# Patient Record
Sex: Female | Born: 1950 | Race: White | Hispanic: No | Smoking: Former smoker
Health system: Southern US, Community
[De-identification: ages and names within clinical notes are randomized; demographics above are authoritative.]

## PROBLEM LIST (undated history)

## (undated) DIAGNOSIS — R55 Syncope and collapse: Secondary | ICD-10-CM

## (undated) DIAGNOSIS — E119 Type 2 diabetes mellitus without complications: Secondary | ICD-10-CM

## (undated) DIAGNOSIS — C50919 Malignant neoplasm of unspecified site of unspecified female breast: Secondary | ICD-10-CM

## (undated) DIAGNOSIS — I82409 Acute embolism and thrombosis of unspecified deep veins of unspecified lower extremity: Secondary | ICD-10-CM

## (undated) DIAGNOSIS — I252 Old myocardial infarction: Secondary | ICD-10-CM

## (undated) DIAGNOSIS — G4733 Obstructive sleep apnea (adult) (pediatric): Secondary | ICD-10-CM

## (undated) DIAGNOSIS — K219 Gastro-esophageal reflux disease without esophagitis: Secondary | ICD-10-CM

## (undated) DIAGNOSIS — I5042 Chronic combined systolic (congestive) and diastolic (congestive) heart failure: Secondary | ICD-10-CM

## (undated) DIAGNOSIS — G629 Polyneuropathy, unspecified: Secondary | ICD-10-CM

## (undated) DIAGNOSIS — I2510 Atherosclerotic heart disease of native coronary artery without angina pectoris: Secondary | ICD-10-CM

## (undated) DIAGNOSIS — I255 Ischemic cardiomyopathy: Secondary | ICD-10-CM

## (undated) DIAGNOSIS — E785 Hyperlipidemia, unspecified: Secondary | ICD-10-CM

## (undated) DIAGNOSIS — I739 Peripheral vascular disease, unspecified: Secondary | ICD-10-CM

## (undated) DIAGNOSIS — I10 Essential (primary) hypertension: Secondary | ICD-10-CM

## (undated) DIAGNOSIS — Z8673 Personal history of transient ischemic attack (TIA), and cerebral infarction without residual deficits: Secondary | ICD-10-CM

## (undated) HISTORY — PX: TUBAL LIGATION: SHX77

## (undated) HISTORY — DX: Chronic combined systolic (congestive) and diastolic (congestive) heart failure: I50.42

## (undated) HISTORY — DX: Hyperlipidemia, unspecified: E78.5

## (undated) HISTORY — PX: BREAST BIOPSY: SHX20

## (undated) HISTORY — DX: Atherosclerotic heart disease of native coronary artery without angina pectoris: I25.10

## (undated) HISTORY — DX: Polyneuropathy, unspecified: G62.9

## (undated) HISTORY — PX: APPENDECTOMY: SHX54

## (undated) HISTORY — DX: Peripheral vascular disease, unspecified: I73.9

## (undated) HISTORY — PX: CHOLECYSTECTOMY: SHX55

## (undated) HISTORY — DX: Syncope and collapse: R55

## (undated) HISTORY — DX: Essential (primary) hypertension: I10

## (undated) HISTORY — DX: Acute embolism and thrombosis of unspecified deep veins of unspecified lower extremity: I82.409

## (undated) HISTORY — DX: Type 2 diabetes mellitus without complications: E11.9

## (undated) HISTORY — DX: Morbid (severe) obesity due to excess calories: E66.01

## (undated) HISTORY — PX: PR VEIN BYPASS GRAFT,AORTO-FEM-POP: 35551

## (undated) HISTORY — DX: Obstructive sleep apnea (adult) (pediatric): G47.33

## (undated) HISTORY — DX: Ischemic cardiomyopathy: I25.5

## (undated) HISTORY — PX: BREAST SURGERY: SHX581

## (undated) HISTORY — DX: Gastro-esophageal reflux disease without esophagitis: K21.9

## (undated) HISTORY — DX: Malignant neoplasm of unspecified site of unspecified female breast: C50.919

## (undated) HISTORY — DX: Personal history of transient ischemic attack (TIA), and cerebral infarction without residual deficits: Z86.73

---

## 2000-11-11 DIAGNOSIS — C50919 Malignant neoplasm of unspecified site of unspecified female breast: Secondary | ICD-10-CM

## 2000-11-11 HISTORY — DX: Malignant neoplasm of unspecified site of unspecified female breast: C50.919

## 2001-03-27 ENCOUNTER — Ambulatory Visit: Admission: RE | Admit: 2001-03-27 | Discharge: 2001-06-25 | Payer: Self-pay | Admitting: *Deleted

## 2005-08-21 ENCOUNTER — Ambulatory Visit: Payer: Self-pay | Admitting: Cardiology

## 2005-08-21 ENCOUNTER — Inpatient Hospital Stay (HOSPITAL_COMMUNITY): Admission: AD | Admit: 2005-08-21 | Discharge: 2005-08-23 | Payer: Self-pay | Admitting: Cardiology

## 2005-08-22 ENCOUNTER — Ambulatory Visit: Payer: Self-pay | Admitting: Cardiology

## 2007-03-18 ENCOUNTER — Ambulatory Visit (HOSPITAL_COMMUNITY): Admission: RE | Admit: 2007-03-18 | Discharge: 2007-03-18 | Payer: Self-pay | Admitting: Family Medicine

## 2008-05-13 ENCOUNTER — Emergency Department (HOSPITAL_COMMUNITY): Admission: EM | Admit: 2008-05-13 | Discharge: 2008-05-13 | Payer: Self-pay | Admitting: Emergency Medicine

## 2009-01-01 ENCOUNTER — Emergency Department (HOSPITAL_COMMUNITY): Admission: EM | Admit: 2009-01-01 | Discharge: 2009-01-01 | Payer: Self-pay | Admitting: Emergency Medicine

## 2009-01-04 ENCOUNTER — Ambulatory Visit: Payer: Self-pay | Admitting: Vascular Surgery

## 2009-01-05 ENCOUNTER — Encounter: Admission: RE | Admit: 2009-01-05 | Discharge: 2009-01-05 | Payer: Self-pay | Admitting: Vascular Surgery

## 2009-01-06 ENCOUNTER — Ambulatory Visit: Payer: Self-pay | Admitting: Cardiology

## 2009-01-06 ENCOUNTER — Encounter (HOSPITAL_COMMUNITY): Admission: RE | Admit: 2009-01-06 | Discharge: 2009-02-05 | Payer: Self-pay

## 2009-01-10 ENCOUNTER — Ambulatory Visit (HOSPITAL_COMMUNITY): Admission: RE | Admit: 2009-01-10 | Discharge: 2009-01-10 | Payer: Self-pay | Admitting: Surgery

## 2009-01-10 ENCOUNTER — Ambulatory Visit: Payer: Self-pay | Admitting: Surgery

## 2009-02-09 ENCOUNTER — Ambulatory Visit: Payer: Self-pay | Admitting: Vascular Surgery

## 2009-03-29 ENCOUNTER — Ambulatory Visit: Payer: Self-pay | Admitting: Vascular Surgery

## 2009-06-05 ENCOUNTER — Encounter: Admission: RE | Admit: 2009-06-05 | Discharge: 2009-06-05 | Payer: Self-pay | Admitting: Internal Medicine

## 2009-06-07 ENCOUNTER — Ambulatory Visit: Payer: Self-pay | Admitting: Vascular Surgery

## 2009-09-27 ENCOUNTER — Ambulatory Visit: Payer: Self-pay | Admitting: Vascular Surgery

## 2009-10-09 ENCOUNTER — Ambulatory Visit: Payer: Self-pay | Admitting: Surgery

## 2010-01-03 ENCOUNTER — Ambulatory Visit: Payer: Self-pay | Admitting: Vascular Surgery

## 2010-01-12 ENCOUNTER — Ambulatory Visit (HOSPITAL_COMMUNITY): Admission: RE | Admit: 2010-01-12 | Discharge: 2010-01-12 | Payer: Self-pay | Admitting: Vascular Surgery

## 2010-01-12 ENCOUNTER — Ambulatory Visit: Payer: Self-pay | Admitting: Vascular Surgery

## 2010-02-14 ENCOUNTER — Ambulatory Visit: Payer: Self-pay | Admitting: Vascular Surgery

## 2010-09-07 ENCOUNTER — Ambulatory Visit: Payer: Self-pay | Admitting: Vascular Surgery

## 2010-10-25 ENCOUNTER — Ambulatory Visit: Payer: Self-pay | Admitting: Vascular Surgery

## 2010-11-30 ENCOUNTER — Encounter: Payer: Self-pay | Admitting: Vascular Surgery

## 2010-11-30 ENCOUNTER — Inpatient Hospital Stay (HOSPITAL_COMMUNITY)
Admission: RE | Admit: 2010-11-30 | Discharge: 2010-12-02 | Payer: Self-pay | Source: Home / Self Care | Attending: Vascular Surgery | Admitting: Vascular Surgery

## 2010-11-30 HISTORY — PX: POPLITEAL ARTERY ANGIOPLASTY: SHX2242

## 2010-12-02 ENCOUNTER — Encounter: Payer: Self-pay | Admitting: Family Medicine

## 2010-12-03 LAB — POCT I-STAT, CHEM 8
BUN: 14 mg/dL (ref 6–23)
Calcium, Ion: 1.13 mmol/L (ref 1.12–1.32)
Chloride: 105 mEq/L (ref 96–112)
Creatinine, Ser: 0.7 mg/dL (ref 0.4–1.2)
HCT: 46 % (ref 36.0–46.0)
Hemoglobin: 15.6 g/dL — ABNORMAL HIGH (ref 12.0–15.0)
TCO2: 29 mmol/L (ref 0–100)

## 2010-12-03 LAB — PROTIME-INR
INR: 1.01 (ref 0.00–1.49)
INR: 0.95 (ref 0.00–1.49)
Prothrombin Time: 13.5 seconds (ref 11.6–15.2)
Prothrombin Time: 12.9 seconds (ref 11.6–15.2)

## 2010-12-03 LAB — GLUCOSE, CAPILLARY
Glucose-Capillary: 249 mg/dL — ABNORMAL HIGH (ref 70–99)
Glucose-Capillary: 298 mg/dL — ABNORMAL HIGH (ref 70–99)

## 2010-12-03 LAB — APTT
aPTT: 194 seconds — ABNORMAL HIGH (ref 24–37)
aPTT: 53 seconds — ABNORMAL HIGH (ref 24–37)

## 2010-12-04 LAB — BASIC METABOLIC PANEL
BUN: 10 mg/dL (ref 6–23)
CO2: 24 mEq/L (ref 19–32)
Calcium: 8 mg/dL — ABNORMAL LOW (ref 8.4–10.5)
Chloride: 106 mEq/L (ref 96–112)
Creatinine, Ser: 0.63 mg/dL (ref 0.4–1.2)
GFR calc Af Amer: >60 mL/min (ref >60)
GFR calc non Af Amer: >60 mL/min (ref >60)
Glucose, Bld: 207 mg/dL — ABNORMAL HIGH (ref 70–99)
Potassium: 3.5 mEq/L (ref 3.5–5.1)
Sodium: 139 mEq/L (ref 135–145)

## 2010-12-04 LAB — CBC
HCT: 38.7 % (ref 36.0–46.0)
Hemoglobin: 12.5 g/dL (ref 12.0–15.0)
MCH: 28.9 pg (ref 26.0–34.0)
Platelets: 178 10*3/uL (ref 150–400)
RBC: 4.32 MIL/uL (ref 3.87–5.11)
RDW: 12.4 % (ref 11.5–15.5)
WBC: 10 10*3/uL (ref 4.0–10.5)

## 2010-12-04 LAB — GLUCOSE, CAPILLARY
Glucose-Capillary: 168 mg/dL — ABNORMAL HIGH (ref 70–99)
Glucose-Capillary: 212 mg/dL — ABNORMAL HIGH (ref 70–99)
Glucose-Capillary: 132 mg/dL — ABNORMAL HIGH (ref 70–99)

## 2010-12-04 LAB — HEMOGLOBIN A1C: Hgb A1c MFr Bld: 12.2 % — ABNORMAL HIGH (ref <5.7)

## 2010-12-13 ENCOUNTER — Encounter (INDEPENDENT_AMBULATORY_CARE_PROVIDER_SITE_OTHER): Payer: Medicare Other

## 2010-12-13 ENCOUNTER — Ambulatory Visit (INDEPENDENT_AMBULATORY_CARE_PROVIDER_SITE_OTHER): Payer: Medicare Other | Admitting: Vascular Surgery

## 2010-12-13 DIAGNOSIS — I70219 Atherosclerosis of native arteries of extremities with intermittent claudication, unspecified extremity: Secondary | ICD-10-CM

## 2010-12-13 DIAGNOSIS — I739 Peripheral vascular disease, unspecified: Secondary | ICD-10-CM

## 2010-12-13 DIAGNOSIS — Z48812 Encounter for surgical aftercare following surgery on the circulatory system: Secondary | ICD-10-CM

## 2010-12-14 NOTE — Op Note (Signed)
NAMESIMRANJIT, [name]                 ACCOUNT NO.:  [id number]  MEDICAL RECORD NO.:  [id number]          PATIENT TYPE:  OIB  LOCATION:  3303                         FACILITY:  [facility]  PHYSICIAN:  [name]. Margene Cherian, MD  DATE OF BIRTH:  1951-10-13  DATE OF PROCEDURE:  11/30/2010 DATE OF DISCHARGE:                              OPERATIVE REPORT   PROCEDURE: 1. Left popliteal and tibial embolectomy. 2. Left popliteal endarterectomy. 3. Bovine pericardial patch angioplasty of left popliteal artery.  PREOPERATIVE DIAGNOSIS:  Acute ischemia, left leg.  POSTOPERATIVE DIAGNOSIS:  Acute ischemia, left leg.  ANESTHESIA:  General.  ASSISTANTS: 1. Di Kindle. Edilia Bo, MD 2. Della Goo, PA-C  OPERATIVE FINDINGS: 1. Acute thrombus in distal popliteal artery and origin of     tibioperoneal trunk and anterior tibial artery. 2. Bovine pericardial patch.  OPERATIVE DETAILS:  After obtaining informed consent, the patient was taken to the operating room.  The patient was placed in supine position on the operating room table.  After induction of general anesthesia and endotracheal intubation, the patient's entire left lower extremity was prepped and draped in the usual sterile fashion.  Next, a longitudinal incision was made on the medial aspect of the left leg.  The incision was carried down through subcutaneous tissues.  Greater saphenous vein was identified and reflected superiorly.  The incision was deepened down to level of the fascia.  The fascia was opened for the full length of the incision.  The popliteal space was entered.  The popliteal vein was reflected posteriorly.  The anterior tibial branch of the popliteal vein was dissected free circumferentially and ligated and divided between silk ties.  The popliteal artery was dissected free circumferentially. It was quite thickened on palpation.  The anterior tibial artery and tibioperoneal trunk were both dissected free  circumferentially and vessel loops were placed around these.  Vessel loops were also placed around the popliteal artery.  The patient was given 7000 units of intravenous heparin.  After appropriate circulation time, a longitudinal opening was made in the popliteal artery just above the level of the takeoff of the tibial vessels.  There was fresh thrombus in the form of two 1-mm size pieces right at the takeoff of tibial vessels in the distal popliteal artery.  The distal popliteal artery was severely atherosclerotic and there was a large plaque right at the level of the takeoff of the tibial vessels which was heavily calcified.  Next, a #3 and #4 Fogarty catheter was passed up to the proximal popliteal artery. Multiple passes were made until there was excellent arterial inflow. There was no obvious thrombus.  This was then reoccluded with a vessel loop.  A #3 Fogarty catheter was then passed down the anterior tibial artery.  Multiple passes were made so all thrombotic material was removed and there was good backbleeding and there were 2 clean passes obtained.  A #3 Fogarty catheter was then passed on the tibioperoneal trunk.  One pass was made which was clean with no thrombus return.  I attempted to make an additional pass, but the catheter would not pass easily.  Therefore,  rather than risk an injury in the artery, no further passes were made down the tibioperoneal trunk.  Everything was thoroughly irrigated with heparinized saline solution.  The atherosclerotic plaque in the popliteal artery had peeled away from the wall and there was no other choice except to do a popliteal endarterectomy.  A good proximal endpoint was obtained with a slight step-off.  The distal endpoint was tacked with a single 7-0 Prolene suture on the posterior wall.  A bovine pericardial patch was then brought up in the operative field and sewn as a patch angioplasty.  The patch angioplasty extended from  approximately 2 cm in the tibioperoneal trunk all the way up on the popliteal artery.  The entire patch length was approximately 4 cm.  Just prior to completion of the patch, everything was fore bled, back bled, and thoroughly flushed.  The anastomosis was secured, clamps were released, and there was pulsatile flow of popliteal artery immediately.  There was good Doppler flow in the proximal anterior tibial and tibioperoneal trunk.  The patient also had a dorsalis pedis and posterior tibial Doppler signal at the foot. Hemostasis was obtained.  Fascia was reapproximated using running 3-0 Vicryl suture.  The skin was closed with 4-0 Vicryl subcuticular stitch. The patient tolerated the procedure well.  There were no complications. Instrument count, sponge count, and needle counts were correct at the end of the case.  The patient was taken to recovery room in stable condition.     [name]. Seaton Hofmann, MD     CEF/MEDQ  D:  11/30/2010  T:  12/01/2010  Job:  [id number]  Electronically Signed by Fabienne Bruns MD on 12/14/2010 01:08:18 PM

## 2010-12-14 NOTE — Op Note (Signed)
[name]                 ACCOUNT NO.:  [id number]  MEDICAL RECORD NO.:  [id number]          PATIENT TYPE:  OIB  LOCATION:  3303                         FACILITY:  [facility]  PHYSICIAN:  [name]. Joannie Medine, MD  DATE OF BIRTH:  [birth year]  DATE OF PROCEDURE:  11/30/2010 DATE OF DISCHARGE:                              OPERATIVE REPORT   PROCEDURES: 1. Ultrasound of right groin. 2. Aortogram with bilateral lower extremity runoff. 3. Angioplasty and stenting of left superficial femoral artery.  PREOPERATIVE DIAGNOSIS:  Claudication.  POSTOPERATIVE DIAGNOSIS:  Claudication.  ANESTHESIA:  Local with IV sedation.  OPERATIVE DETAILS:  After obtaining informed consent, the patient was taken to the [facility] lab.  The patient was placed in supine position on the angio table.  Both groins were prepped and draped in the usual sterile fashion.  Ultrasound was used to identify the right common femoral artery.  Using ultrasound guidance, an introducer needle was used to cannulate the right common femoral artery after administration of local anesthesia.  A 0.035 Versacore wire was threaded into the right femoral artery and up into the abdominal aorta under fluoroscopic guidance. Next, a 5-French sheath was placed in the right common femoral artery and this was thoroughly flushed with heparinized saline.  A 5-French pigtail catheter was then placed over the guidewire up into the abdominal aorta and abdominal aortogram obtained.  This shows bilateral single renal arteries which are patent.  The infrarenal abdominal aorta is also patent.  The left and right common, external, and internal iliac arteries are patent.  An oblique view of the pelvis was also performed, and this showed that the needle puncture was actually at the femoral bifurcation or the very proximal superficial femoral artery.  A lower extremity runoff view was then obtained after pulling the pigtail catheter down just above the  aortic bifurcation.  In the left lower extremity, the left common femoral profunda femoris artery is patent. The left superficial femoral artery has a high-grade greater than 90% stenosis above the level of a patent left superficial femoral artery stent.  There is some mild in-stent restenosis in the left superficial femoral artery stent.  Below that, the popliteal artery is patent.  The tibioperoneal trunk and anterior tibial arteries are patent, although there is some atherosclerotic disease and narrowing at the takeoff of these tibial vessels.  There is three-vessel runoff to the left foot.  In the right lower extremity, the right common femoral artery is patent. The right profunda femoris artery is patent.  The right superficial femoral artery is occluded approximately 2 cm after its origin.  It then reconstitutes via profunda collaterals.  The popliteal artery is patent above the knee.  There is three-vessel runoff to the right foot. However, the anterior tibial artery is cut off on the lateral aspect of the page.  At this point, it was decided to intervene on the left SFA stenosis.  The patient was given 5000 units of intravenous heparin.  It was decided to intervene on the left lower extremity.  At this point, the pigtail catheter was removed over a guidewire.  A  5-French crossover catheter was then brought up in the operative field, and this was used to selectively catheterize the left common iliac artery.  The 0.035 Versacore wire was then threaded down into the left external iliac artery and into the proximal left superficial femoral artery.  The crossover catheter was then removed, and the 5-French sheath exchanged for 7-French Terumo sheath.  Just prior to the sheath exchange, the patient was given 5000 units of intravenous heparin.  The Versacore wire was held in place while the sheath was exchanged.  The Versacore wire was then advanced down the superficial femoral artery,  and this easily crossed the stenosis in the superficial femoral artery and the wire was advanced to the distal end of the preexisting stent.  At this point, a 5 x 8 angioplasty balloon was advanced over the wire and several arteriograms were performed to determine the precise level of the stenosis.  The balloon was positioned at the central portion of the stenosis and then this was inflated to 10 atmospheres for 30 seconds. There was a small area of stenosis that was not covered with the initial inflation, so the balloon was pulled back several centimeters and was reinflated to 10 atmospheres from 40 seconds.  A contrast angiogram was performed which showed that the lumen was open enough to accept the stent at this point.  There was a small area of dissection in the proximal superficial femoral artery.  At this point, a 6 x 150 Smart self-expanding nitinol stent was brought up in the operative field and this was positioned over the area of the previous angioplasty with approximately 1-cm overlap of the distal stent.  Stent was then deployed.  The stent was then postdilated with a 6 x 120 angioplasty balloon to 8 atmospheres for 60 seconds, two inflations were performed to make sure that the entire distal preexisting stent was angioplastied due to some in-stent restenosis as well as the new stent.  At this point, the balloon was removed and a completion of the arteriogram was performed.  This showed that the stent was widely patent and the area of dissection had been completely covered with the stent proximally.  Both stents were widely patent distally; however, it was noted that there was diminished flow through the below-knee popliteal artery at this point. There was delayed filling of the distal anterior tibial artery, the peroneal artery, and the posterior tibial artery, most likely due to occlusion at the level of the takeoff of these vessels.  At this point, the patient was given an  additional 5000 units of heparin and a 10 mg dose of tenecteplase was ordered from the pharmacy.  Since it took approximately 20 minutes for the tenecteplase to back from the pharmacy and an additional 5000 units of heparin was also given to the patient. The tenecteplase was injected through the Terumo sheath directly into the left superficial femoral artery.  An additional image was obtained after approximately 2 minutes of circulation time.  This again showed occlusion at the level of the popliteal artery at the takeoff of the tibial vessels.  A total of 15 minutes was then used before an additional angiogram was obtained, and this again showed distal occlusion of the popliteal artery.  At this point, it was decided that no further intervention would be performed and the patient would need a surgical embolectomy.  The 7-French Terumo sheath was pulled back over the guidewire, and the guidewire was pulled into the iliac system.  The 7-French  Terumo sheath was then exchanged for a 7-French short sheath and the guidewire removed.  The 7-French short sheath was then thoroughly flushed with heparinized saline.  The patient's left foot was inspected and found to be pink and warm with brisk capillary refill.  At this point, it was discussed with the patient that she would need a surgical embolectomy to remove the debris at her below-knee popliteal artery.  The 7-French sheath was sutured into the right groin.  The patient tolerated the procedure well; otherwise, there were no other complications.  The patient was taken to the operating room holding area for embolectomy.  COMPLICATIONS:  Embolus, left popliteal artery.  OPERATIVE FINDINGS: 1. Right superficial femoral artery occlusion 2 cm from the origin     with reconstitution of three-vessel runoff to the right foot     although the runoff was not completely visualized due to the     patient's size and the fact that she had an embolus in  the left     leg.  The right lower extremity views were not repeated further. 2. 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 3. In-stent restenosis of left superficial femoral artery stent,     angioplastied to zero residual stenosis. 4. Embolus to left below-knee popliteal artery at the level of the     tibial takeoff vessels.     [name]. Paxon Propes, MD     CEF/MEDQ  D:  11/30/2010  T:  12/01/2010  Job:  [id number]  Electronically Signed by Fabienne Bruns MD on 12/14/2010 01:08:10 PM

## 2010-12-18 NOTE — Discharge Summary (Addendum)
  [name], BREAU                 ACCOUNT NO.:  [id number]  MEDICAL RECORD NO.:  [id number]          PATIENT TYPE:  INP  LOCATION:  2006                         FACILITY:  [facility]  PHYSICIAN:  [name]. Makenzie Vittorio, MD  DATE OF BIRTH:  1951/08/29  DATE OF ADMISSION:  11/30/2010 DATE OF DISCHARGE:  12/02/2010                              DISCHARGE SUMMARY   PAST MEDICAL HISTORY:  Significant for insulin-dependent diabetes.  HISTORY OF PRESENT ILLNESS:  Ms. [name] was admitted with left fem-pop occlusive disease.  She had an angiogram with angioplasty and stenting of the left superficial femoral artery on November 30, 2010.  This was done for claudication.  She was also found to have embolus in the left popliteal artery after the procedure and was then taken emergently to the operating room for left popliteal tibial embolectomy, left popliteal endarterectomy, and Bovine patch angioplasty.  Postoperatively, the patient did well.  Foot was warm and pink.  She had good PT and DP per Doppler.  She was begun on ambulation.  She had no hematoma in the right groin and she was discharged on December 02, 2010.  FINAL DIAGNOSES: 1. Claudication with left fem-pop occlusive disease status post left     superficial femoral artery stenting. 2. Left popliteal and tibial embolus status post embolectomy, stenting     of her SFA. 3. Her diabetes was followed with sliding scale and stable while in-     house.  DISCHARGE MEDICATIONS:  Included Humulin NPH insulin 25 units twice daily, regular insulin 15 units 4 times a day.  She was sent home on oxycodone 1-2 tabs every 4 hours as needed for pain 5 mg and Plavix as well.     Della Goo, PA-C   ______________________________ [name] Kallyn Demarcus, MD    RR/MEDQ  D:  12/17/2010  T:  12/18/2010  Job:  [id number]  Electronically Signed by Della Goo PA on 12/18/2010 05:36:59 PM Electronically Signed by Fabienne Bruns MD on 01/02/2011 03:05:14  PM

## 2010-12-20 ENCOUNTER — Ambulatory Visit: Payer: Self-pay | Admitting: Vascular Surgery

## 2010-12-20 ENCOUNTER — Encounter (INDEPENDENT_AMBULATORY_CARE_PROVIDER_SITE_OTHER): Payer: Medicare Other

## 2010-12-20 DIAGNOSIS — I70219 Atherosclerosis of native arteries of extremities with intermittent claudication, unspecified extremity: Secondary | ICD-10-CM

## 2010-12-20 DIAGNOSIS — M79609 Pain in unspecified limb: Secondary | ICD-10-CM

## 2010-12-21 NOTE — Assessment & Plan Note (Signed)
OFFICE VISIT  [name], [name] DOB:  24-Jun-1951                                       12/20/2010 [id number]#:[id number]  This patient returns for follow-up today.  She was last seen on 12/13/2010. She recently underwent redo angioplasty of her left superficial femoral artery and had distal embolization requiring a thrombectomy and endarterectomy of the popliteal artery.  She was having some claudication symptoms on February 2.  At that time her ABI on the left side was 0.57, ABI on the right side was 0.47.  She returns today complaining of some pain around her incision with some continuing drainage from the incision.  She denies any fever or chills.  She had a venous duplex exam today which showed no evidence of DVT.  She did have some fluid in the popliteal space.  PHYSICAL EXAMINATION:  Blood pressure 177/97 in the right arm, heart rate 111 and regular, temperature is 98.4, respirations 24.  Left lower extremity incision has some mild erythema just around the skin edges. There is some serous sticky type drainage coming from the lower third of the incision.  This is fairly minimal but steady flow.  Left foot is pink and warm.  I believe the patient is a fairly low risk for infection currently and this most likely represents lymphatic leaks.  I believe the best option would be to place her on Keflex, a  2-week course in the event that there is some bacteria present.  She will continue to walk daily.  She will follow up in 2 weeks' time to recheck her wound.  Also was given a renewal prescription for hydrocodone 30 dispensed today.    Sherri Hora. Fields, MD Electronically Signed  CEF/MEDQ  D:  12/21/2010  T:  12/21/2010  Job:  [id number]

## 2010-12-24 NOTE — Assessment & Plan Note (Signed)
OFFICE VISIT  [name], [name] DOB:  12/11/1950                                       12/13/2010 HYQMV#:[id number]  The patient returns for follow-up today.  She recently underwent re- angioplasty of her left superficial femoral artery stent.  This was complicated by a distal embolization of plaque which required an endarterectomy of her left popliteal artery as well as thrombectomy of her popliteal artery.  She returns today for further follow-up.  She states that she still has claudication symptoms in her left leg as well as her right.  This is at fairly short distance.  She denies any rest pain in her left foot.  She is still requiring some narcotic pain medication for her recent operation in her left leg.  She states that she has not been smoking since her recent hospital admission.  She denies any significant drainage from her left leg incision although there was some drainage during the office visit today.  She is cleaning this daily with soap and water.  PHYSICAL EXAMINATION:  Blood pressure is 182/92 in the right arm, heart rate is 108 and regular.  Temperature is 98.6.  The left below-knee incision is healing.  There was a small amount of serous drainage. There is some edema in the left lower extremity.  The left foot is slightly cool but has fairly brisk capillary refill and is fairly symmetric in temperature to the right foot.  There are no open ulcers on the foot.  She had bilateral ABIs performed today.  The left side ABI was 0.57, which is essentially unchanged from before her angioplasty.  Right side ABI was 0.47, which is also unchanged.  In summary, the patient has a healing incision in her left lower extremity but still has significant claudication symptoms.  I do not believe she would be a candidate really for any further interventions from an endovascular standpoint such as angioplasty or stenting, especially in light of the fact of  the complication she had from her most recent angioplasty procedure.  I believe the best option at this point would be to allow her incision to heal completely and then based on her symptoms, we will have discussions on whether or not she would be a candidate for a bypass operation.  However, I think she would be high- risk for complications from this and that if her symptoms are claudication alone, we are probably going to observe her for awhile before embarking on another procedure.  She will return in 1 month's time to recheck her incision and have further discussions regarding where we go from here.    [name]. Fields, MD Electronically Signed  CEF/MEDQ  D:  12/13/2010  T:  12/14/2010  Job:  [id number]  cc:   Dr. Eric Form

## 2011-01-04 ENCOUNTER — Ambulatory Visit (INDEPENDENT_AMBULATORY_CARE_PROVIDER_SITE_OTHER): Payer: Medicare Other

## 2011-01-04 DIAGNOSIS — I70219 Atherosclerosis of native arteries of extremities with intermittent claudication, unspecified extremity: Secondary | ICD-10-CM

## 2011-01-04 NOTE — Assessment & Plan Note (Signed)
OFFICE VISIT  [name], [name] DOB:  [birth year]                                       01/04/2011 [id number]#:[id number]  This is a postop followup.  HISTORY OF PRESENT ILLNESS:  This is a 60 year old female that Dr. Darrick Penna previously has done a left popliteal and tibial embolectomy and bovine patch angioplasty of the left popliteal artery.  Apparently yesterday she noted some drainage from her left calf incision.  She notes some intermittent temperature fluctuation but she has a known history now of hot flashes so she is unclear whether or not she is truly having fevers or chills.  She thinks the drainage looks like pus.  She has previously been seen by Dr. Darrick Penna recently and placed on antibiotics.  PHYSICAL EXAMINATION:  Today she had a temperature of 98.1 with a blood pressure 163/90 with a heart rate of 108, respirations were 12.  On focused examination the left calf incision demonstrates area of separation.  There is a hematoma that is evident, with some serous drainage.  No frank purulence.  There is some erythema that is nonblanching around the incision but there is no streaking lymphangiitis or there is no spreading erythema up the leg.  There is some mild tenderness to palpation around the incision.  With gentle palpation I do not express any frank purulence of this incision.  MEDICAL DECISION MAKING:  This is a 60 year old female with multiple comorbidities including diabetes who presents now with a left wound superficial dehiscence.  The patient at this point is spontaneously draining and I suspect that the hematoma will spontaneously liquefy and she will drain further.  There is no spreading cellulitis in this patient's leg.  She is already on Keflex.  I have changed her antibiotics over to Bactrim double strength one p.o. b.i.d. to better cover for any possible MRSA.  I gave her the option of possibly coming to the hospital for exploration and  drainage of this calf.  She did not seem very interested in this option so at this point I recommended we continue with antibiotics and wet-to-dry dressing changes to this left calf.  She has a followup appointment with Dr. Darrick Penna this coming Thursday.  I also explained to her if she develops any spreading erythema or fever or chills that she needs to follow up with [place] in the ER, and at that point we would make further interventions as necessary. She understood these instructions and then nursing staff demonstrated to her how to do wet-to-dry dressing changes.  She is going to do this twice a day.    Fransisco Hertz, MD Electronically Signed  BLC/MEDQ  D:  01/04/2011  T:  01/04/2011  Job:  [id number]

## 2011-01-04 NOTE — Procedures (Unsigned)
DUPLEX DEEP VENOUS EXAM - LOWER EXTREMITY  INDICATION:  Pain and swelling at incision site from recent surgery.  HISTORY:  Edema:  Yes. Trauma/Surgery:  Recent arterial embolectomy. Pain:  Yes. PE:  No. Previous DVT:  No. Anticoagulants: Other:  DUPLEX EXAM:               CFV   SFV   PopV  PTV    GSV               R  L  R  L  R  L  R   L  R  L Thrombosis    o  o     o     o      o     o Spontaneous   +  +     +     +      +     + Phasic        +  +     +     +      +     + Augmentation  +  +     +     +      +     + Compressible  +  +     +     +      +     + Competent     +  +     +     +      +     +  Legend:  + - yes  o - no  p - partial  D - decreased  IMPRESSION:  No evidence of deep venous thrombosis or superficial venous thrombophlebitis in the left lower extremity; however, there is significant fluid in the tissues of the surgical site.  Right common femoral vein is within normal limits.   _____________________________ [name] Fields, MD  LT/MEDQ  D:  12/20/2010  T:  12/20/2010  Job:  [id number]

## 2011-01-07 ENCOUNTER — Ambulatory Visit: Payer: Medicare Other | Admitting: Surgery

## 2011-01-10 ENCOUNTER — Ambulatory Visit (INDEPENDENT_AMBULATORY_CARE_PROVIDER_SITE_OTHER): Payer: Medicare Other | Admitting: Vascular Surgery

## 2011-01-10 DIAGNOSIS — I70219 Atherosclerosis of native arteries of extremities with intermittent claudication, unspecified extremity: Secondary | ICD-10-CM

## 2011-01-11 NOTE — Assessment & Plan Note (Signed)
OFFICE VISIT  DVORA, BUITRON DOB:  01/20/1951                                       01/10/2011 ZOXWR#:[id number]  The patient is status post patch angioplasty of her left popliteal artery after distal embolization from a redo angioplasty of her SFA stent.  She returns today for further followup.  She was last seen by my partner, Dr. Imogene Burn, on February 24 for increased drainage from her below knee incision.  On exam today blood pressure is 144/80, heart rate is 112, temperature is 98.2.  The below knee incision has serous drainage coming from this. This was opened wider today to allow better drainage and packing.  The muscle tissue beneath is viable and pink in appearance, although there is minimal granulation tissue at this point.  Hopefully this will continue to heal with local wound care.  She is now developing an ulceration on the lateral aspect of her left foot.  Apparently this was an area of dry cracked skin which has now opened and turned into a small ulcer.  I counseled the family today in continuing to apply moisturizing lotion to her left foot to try to prevent further skin breakdown.  She is not a candidate for further revascularization currently due to the fact that she has an ongoing wound problem in her left leg.  She is at risk for limb loss long-term if this wound continues to deteriorate. She will follow up with [place] in 2 weeks' time and they will continue to be do b.i.d. normal saline wet-to-dry dressings.    [name]. Emari Hreha, MD Electronically Signed  CEF/MEDQ  D:  01/10/2011  T:  01/11/2011  Job:  [id number]

## 2011-01-15 ENCOUNTER — Ambulatory Visit (INDEPENDENT_AMBULATORY_CARE_PROVIDER_SITE_OTHER): Payer: Medicare Other | Admitting: Vascular Surgery

## 2011-01-15 DIAGNOSIS — I70219 Atherosclerosis of native arteries of extremities with intermittent claudication, unspecified extremity: Secondary | ICD-10-CM

## 2011-01-16 NOTE — Assessment & Plan Note (Signed)
OFFICE VISIT  [name], [name] DOB:  04/06/1951                                       01/15/2011 [id number]#:[id number]  This patient returns to the office today for inspection of her left popliteal wound.  She was last seen by Dr. Darrick Penna last week and was seen by Dr. Imogene Burn on February 24.  She is currently on Bactrim double strength 1 p.o. b.i.d. but only has one left.  She has had increasing drainage from the wound but no chills and fever.  PHYSICAL EXAMINATION:  Today her temperature is 98.6, blood pressure 140/70, heart rate 80.  Left popliteal wound was examined.  There was some purulent drainage on the dressing.  There has been concern about MRSA in the past.  I explored this and cleaned it with a dry gauze and it does have one area that goes slightly deeper in the midportion of the wound where it seems that the drainage is located.  There is no active purulent drainage at this time.  I instructed the family to keep cleaning the wound with a dry gauze three times today and to packed it into the depth of the wound and try this rather than moist gauze for now.  I have given her a prescription for double strength Bactrim for another 2 weeks and also as Tylox 40 tablet. She will return next week to see Dr. Darrick Penna for further follow- up.    Quita Skye. Hart Rochester, M.D. Electronically Signed  JDL/MEDQ  D:  01/15/2011  T:  01/16/2011  Job:  [id number]

## 2011-01-24 ENCOUNTER — Ambulatory Visit (INDEPENDENT_AMBULATORY_CARE_PROVIDER_SITE_OTHER): Payer: Medicare Other | Admitting: Vascular Surgery

## 2011-01-24 DIAGNOSIS — I70219 Atherosclerosis of native arteries of extremities with intermittent claudication, unspecified extremity: Secondary | ICD-10-CM

## 2011-01-25 NOTE — Assessment & Plan Note (Signed)
OFFICE VISIT  [name], [name] DOB:  23-Oct-1951                                       01/24/2011 UYQIH#:[id number]  The patient returns for followup today.  She previously underwent left popliteal and tibial embolectomy with patch angioplasty of her popliteal artery on November 30, 2010.  She has had some difficulty healing up the below-knee incision.  Her patch angioplasty was done with a bovine pericardial patch.  She was seen by my partner, Dr. Hart Rochester, on March 6th and had her wound opened up slightly more to allow better drainage.  She still continues to have some drainage from the below-knee popliteal incision.  She is currently taking Bactrim DS twice daily.  She is doing wound care currently with dry gauze twice daily due to the copious amount of fluid.  On physical exam today, blood pressure was 164/89 in the right arm, heart rate was 109 and regular, respirations 20, temperature is 98.6. She has a large below-knee wound which is approximately 5 x 7 cm with 4 cm in depth.  Overall, this is clean with good granulation tissue. There is still some murky yellowish drainage.  However, this is not as copious currently as the daughter has described at home.  The left foot is pink and appears adequately perfused although she does have a punctate ulceration over the left fifth metatarsal head.  She is currently not a candidate for any further revascularization as long as she has this open nonhealing wound.  I believe the best option at this point would be to try to place a VAC dressing on her below-knee incision due to the amount of drainage that she is having as well as the appearance of the wound being fairly healthy at this point.  I did renew her prescription today for Bactrim 30 dispensed with 2 refills.  She was also given an additional prescription for oxycodone #30 dispensed.  We will arrange for her to get a VAC dressing for this and I will see her  back in 2 weeks' time.  I did discuss with her that still there is a possibility that the patch may need to be removed and there is a possibility of infection.  But, hopefully, this will clear up with local wound care alone.    Sherri Hora. Santino Kinsella, MD Electronically Signed  CEF/MEDQ  D:  01/24/2011  T:  01/25/2011  Job:  [id number]

## 2011-02-04 LAB — POCT I-STAT, CHEM 8
Chloride: 104 mEq/L (ref 96–112)
Creatinine, Ser: 0.5 mg/dL (ref 0.4–1.2)
HCT: 46 % (ref 36.0–46.0)
Hemoglobin: 15.6 g/dL — ABNORMAL HIGH (ref 12.0–15.0)
Potassium: 4.1 mEq/L (ref 3.5–5.1)
Sodium: 139 mEq/L (ref 135–145)

## 2011-02-04 LAB — GLUCOSE, CAPILLARY: Glucose-Capillary: 129 mg/dL — ABNORMAL HIGH (ref 70–99)

## 2011-02-07 ENCOUNTER — Ambulatory Visit (INDEPENDENT_AMBULATORY_CARE_PROVIDER_SITE_OTHER): Payer: Medicare Other | Admitting: Vascular Surgery

## 2011-02-07 DIAGNOSIS — I739 Peripheral vascular disease, unspecified: Secondary | ICD-10-CM

## 2011-02-07 DIAGNOSIS — L98499 Non-pressure chronic ulcer of skin of other sites with unspecified severity: Secondary | ICD-10-CM

## 2011-02-08 NOTE — Assessment & Plan Note (Signed)
OFFICE VISIT  [name], [name] DOB:  Dec 31, 1950                                       02/07/2011 ZOXWR#:[id number]  The patient returns for followup today.  She has been having difficulty healing up her below knee incision in the left leg.  Recently a VAC dressing was ordered for her today.  She states it is still draining some through the [facility] dressing but overall she feels the wound is smaller.  She denies any fever or chills.  She is currently still on Bactrim and is taking oxycodone intermittently for pain control.  PHYSICAL EXAM:  Blood pressure is 147/80 in the left arm, heart rate 105 and regular, respirations 20.  The left below knee incision is approximately 5 cm in length and 1.5 cm in depth.  There is good granulation tissue.  There is minimal drainage.  There is no surrounding skin erythema although she does have some slight rash around the skin edges consistent with moisture from contact from the [facility] dressing or possibly a mild yeast infection.  Overall, however, I think she is doing well.  The wound is continuing to heal.  Will continue the antibiotics and the VAC dressing.  She was given a prescription today for oxycodone #30 dispensed today.  She will follow up in 3 weeks' time.    [name]. [name], MD Electronically Signed  CEF/MEDQ  D:  02/07/2011  T:  02/08/2011  Job:  [id number]

## 2011-02-21 LAB — GLUCOSE, CAPILLARY
Glucose-Capillary: 192 mg/dL — ABNORMAL HIGH (ref 70–99)
Glucose-Capillary: 300 mg/dL — ABNORMAL HIGH (ref 70–99)
Glucose-Capillary: 387 mg/dL — ABNORMAL HIGH (ref 70–99)

## 2011-02-21 LAB — POCT I-STAT, CHEM 8
Calcium, Ion: 1.1 mmol/L — ABNORMAL LOW (ref 1.12–1.32)
Chloride: 103 mEq/L (ref 96–112)
HCT: 44 % (ref 36.0–46.0)
Sodium: 136 mEq/L (ref 135–145)
TCO2: 25 mmol/L (ref 0–100)

## 2011-02-26 LAB — COMPREHENSIVE METABOLIC PANEL
AST: 18 U/L (ref 0–37)
Albumin: 3.1 g/dL — ABNORMAL LOW (ref 3.5–5.2)
Calcium: 9 mg/dL (ref 8.4–10.5)
Creatinine, Ser: 0.55 mg/dL (ref 0.4–1.2)
GFR calc Af Amer: >60 mL/min (ref >60)
Total Protein: 6 g/dL (ref 6.0–8.3)

## 2011-02-26 LAB — DIFFERENTIAL
Eosinophils Relative: 1 % (ref 0–5)
Lymphocytes Relative: 29 % (ref 12–46)
Lymphs Abs: 2.4 10*3/uL (ref 0.7–4.0)
Monocytes Absolute: 0.6 10*3/uL (ref 0.1–1.0)
Monocytes Relative: 7 % (ref 3–12)

## 2011-02-26 LAB — CBC
MCHC: 34.1 g/dL (ref 30.0–36.0)
MCV: 89.5 fL (ref 78.0–100.0)
Platelets: 223 10*3/uL (ref 150–400)

## 2011-02-26 LAB — PROTIME-INR: Prothrombin Time: 12.4 seconds (ref 11.6–15.2)

## 2011-02-28 ENCOUNTER — Ambulatory Visit (INDEPENDENT_AMBULATORY_CARE_PROVIDER_SITE_OTHER): Payer: Medicare Other | Admitting: Vascular Surgery

## 2011-02-28 DIAGNOSIS — I70219 Atherosclerosis of native arteries of extremities with intermittent claudication, unspecified extremity: Secondary | ICD-10-CM

## 2011-03-01 NOTE — Assessment & Plan Note (Signed)
OFFICE VISIT  [name], [name] DOB:  [birth year]                                       02/28/2011 ZOXWR#:[id number]  The patient returns for followup today.  She previously had patch angioplasty of her left popliteal artery and had poor healing of the below knee incision.  She recently had a VAC dressing on but this has been discontinued and the wound is essentially healed at this point. She states she still has pain in both feet but does not really complain of one worse than the other.  On physical exam today the below knee incision is now completely healed. There is no drainage.  The left foot is pink, warm and well-perfused. She had an ulcer on the lateral aspect of the left fifth metatarsal and this is also completely healed as well.  Overall she is continuing to do well.  The pain in her feet may have some neuropathic component as well as PAD.  I believe she needs several weeks to continue to heal up before we consider whether or not another revascularization is in her best interest.  She will follow up in June of 2012.  We will do ABIs with toe pressures as well as an arterial duplex exam of both lower extremities at that time.  She was given a prescription today for Percocet #15 dispensed, no further refills.  I also discussed with her today that she should not need any further pain medicine now that her wounds are completely healed.    [name]. Fields, MD Electronically Signed  CEF/MEDQ  D:  02/28/2011  T:  03/01/2011  Job:  [id number] [id number]

## 2011-03-26 NOTE — Procedures (Signed)
LOWER EXTREMITY ARTERIAL DUPLEX   INDICATION:  Follow up left SFA and popliteal stent and previous angio.   HISTORY:  Diabetes:  Yes.  Cardiac:  No.  Hypertension:  Yes.  Smoking:  Yes.  Previous Surgery:  Left SFA and popliteal stents, 01/10/10, with recent  angioplasty on 01/12/10.   SINGLE LEVEL ARTERIAL EXAM                          RIGHT                LEFT  Brachial:               159                  Mastectomy  Anterior tibial:        63                   118  Posterior tibial:       54                   129  Peroneal:  Ankle/Brachial Index:   0.40                 0.81   LOWER EXTREMITY ARTERIAL DUPLEX EXAM   PREVIOUS ANKLE BRACHIAL INDEX:  Date:  01/03/2010  RIGHT:  0.42  LEFT:  0.46   DUPLEX:  Triphasic waveforms in the common femoral artery became  biphasic in the SFA, popliteal artery, posterior tibial artery,  peroneal, and anterior tibial artery.   IMPRESSION:  1. Patent stents with no evidence of narrowing or stenosis.  2. Ankle brachial indices are stable on the right with previous study      and an increase in ankle brachial index on the left side.     ___________________________________________  [name] Fields, MD   NT/MEDQ  D:  02/14/2010  T:  02/14/2010  Job:  [id number]

## 2011-03-26 NOTE — Assessment & Plan Note (Signed)
OFFICE VISIT   Sherri, [name]  DOB:  1951-09-25                                       02/14/2010  ZOXWR#:[id number]   The patient returns for followup today.  She underwent redo angioplasty  of her left superficial femoral artery on 01/12/2010.  She returns today  for further followup.  She states she still has some occasional pain in  her left foot.  She does not really describe claudication symptoms in  the right or left leg.  She continues to smoke but has cut back  considerably with the assistance of Wellbutrin.  She is currently on  four cigarettes per day.  Greater than 3 minutes today were spent  regarding smoking cessation counseling.   CHRONIC MEDICAL PROBLEMS:  Continue to include diabetes and  hypertension.  These are under control and followed by Dr. Clelia Croft.  She  also does have bilateral neuropathy.   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  She was not a candidate for  percutaneous intervention on the right side.   PHYSICAL EXAM:  Vital signs:  Today blood pressure is 119/73 in the  right arm, heart rate is 114 and regular.  Lower extremities:  She has  2+ femoral pulses bilaterally.  She has a 1+ dorsalis pedis pulse in the  left foot.  She has absent pedal pulses in the right foot.  She has no  ulcerations on the feet.  Feet are pink, warm and reasonably well-  perfused bilaterally.   She also had bilateral ABIs today which were 0.81 on the left and 0.40  on the right.  I had a lengthy discussion with the patient today about  smoking cessation.  Additionally I discussed with her that she would not  be a candidate for an angioplasty or stenting in the right leg.  She did  have some questions regarding treatment on this side.  However, I am not  inclined to do a percutaneous or a bypass procedure in her right  leg  until her symptoms become worse over time as the durability of this  would not be necessarily lifelong.  I believe the best option is  continued conservative management of her right lower extremity.  If she  develops debilitating rest pain or nonhealing wounds we would consider a  bypass at that point.  As far as her left lower extremity is concerned  her left SFA stent is widely patent at this point.  I believe most of  the symptoms she currently describes in both of her feet are neuropathic  in nature rather than ischemic.  She will follow up with repeat ABIs in  six months' time.  She will follow up sooner if she develops progressive  symptoms in her right lower extremity.     [name]. Fields, MD  Electronically Signed   CEF/MEDQ  D:  02/14/2010  T:  02/15/2010  Job:  [id number]   cc:   Kari Baars, M.D.

## 2011-03-26 NOTE — Procedures (Signed)
LOWER EXTREMITY ARTERIAL DUPLEX   INDICATION:  Followup, left SFA and popliteal stents with a previous  angioplasty.   HISTORY:  Diabetes:  Yes.  Cardiac:  No.  Hypertension:  Yes.  Smoking:  Yes.  Previous Surgery:  Left superficial femoral artery and popliteal stents  with percutaneous transluminal angioplasty on 01/12/2010.   SINGLE LEVEL ARTERIAL EXAM                          RIGHT                LEFT  Brachial:               199                  189  Anterior tibial:        95                   99  Posterior tibial:       83                   109  Peroneal:  Ankle/Brachial Index:   0.48                 0.55   LOWER EXTREMITY ARTERIAL DUPLEX EXAM   PREVIOUS ABI:  Date:  02/14/2010  Right:  0.40  Left:  0.81   DUPLEX:  Duplex is broadened biphasic waveforms within the left  superficial femoral artery.  Mono waveforms within the popliteal artery.  >75% stenosis within the left distal superficial femoral artery with  velocities ranging from 5.74 m/s to 3.3 m/s.   IMPRESSION:  Inconclusive ankle brachial indices due to vessel  calcification, although bilateral toe brachial indices suggestive of  severe ischemia.  Patent stents with evidence of >75% stenosis within  the left distal superficial femoral artery with velocities ranging from  5.74 m/s to 3.37 m/s.   ___________________________________________  [name]. Fields, MD   OD/MEDQ  D:  09/11/2010  T:  09/11/2010  Job:  [id number]

## 2011-03-26 NOTE — Procedures (Signed)
BYPASS GRAFT EVALUATION   INDICATION:  Left lower extremity stents.   HISTORY:  Diabetes:  Yes.  Cardiac:  No.  Hypertension:  No.  Smoking:  Yes.  Previous Surgery:  Left superficial femoral and popliteal artery stents  on 01/10/2009.   SINGLE LEVEL ARTERIAL EXAM                               RIGHT              LEFT  Brachial:                    154  Anterior tibial:             76                 77  Posterior tibial:            88                 98  Peroneal:  Ankle/brachial index:        0.54               0.64   PREVIOUS ABI:  Date:  02/09/2009  RIGHT:  0.48  LEFT:  0.73   LOWER EXTREMITY BYPASS GRAFT DUPLEX EXAM:   DUPLEX:  Biphasic and monophasic Doppler waveforms noted throughout the  left superficial femoral and popliteal arteries with no focal increase  in velocities.   IMPRESSION:  1. Patent left superficial femoral and popliteal artery stents with no      evidence of stenosis.  2. Stable bilateral ankle brachial indices.   ___________________________________________  [name] Fields, MD   CH/MEDQ  D:  06/07/2009  T:  06/07/2009  Job:  [id number]

## 2011-03-26 NOTE — Procedures (Signed)
BYPASS GRAFT EVALUATION   INDICATION:  Followup of left SFA and popliteal stents.   HISTORY:  Diabetes:  Yes.  Cardiac:  No.  Hypertension:  Yes.  Smoking:  Yes.  Previous Surgery:  Left SFA and popliteal stents, 01/10/09.   SINGLE LEVEL ARTERIAL EXAM                               RIGHT              LEFT  Brachial:                    138                Mastectomy  Anterior tibial:             58                 63  Posterior tibial:            36                 43  Peroneal:  Ankle/brachial index:        0.42               0.46   PREVIOUS ABI:  Date: 09/27/09  RIGHT:  0.42  LEFT:  0.48   LOWER EXTREMITY BYPASS GRAFT DUPLEX EXAM:   DUPLEX:  Triphasic waveforms in the CFA become biphasic in the SFA,  popliteal, PTA, peroneal and ATA.   IMPRESSION:  1. Patent stents with diminished velocities.  2. Ankle brachial indices are stable with previous study.   ___________________________________________  [name] Fields, MD   CJ/MEDQ  D:  01/03/2010  T:  01/03/2010  Job:  [id number]

## 2011-03-26 NOTE — Assessment & Plan Note (Signed)
OFFICE VISIT   [name], [name]  DOB:  1951/11/04                                       11/15/2010  NWGNF#:[id number]   The patient presents for further followup of her lower extremity  arterial occlusive disease.  She previously underwent angioplasty and  stenting of her left superficial femoral artery March 2010 by Dr.  Myra Gianotti.  She subsequently underwent angioplasty of restenosis of her  left superficial femoral stent in March 2011.  She was noted to have  decreased ABIs with recurrence of symptoms in October of this year and  presents for further followup.   Unfortunately, the patient continues to smoke, and greater than 3  minutes were spent regarding smoking cessation counseling today.  She is  currently smoking 5 cigarettes per day.   Other chronic medical problems include diabetes, hypertension and  peripheral neuropathy.  These are all currently controlled and followed  by Dr. Clelia Croft.   She currently experiences some pain with walking in her right foot after  approximately 30 yards.  She develops calf-like claudication at about 30  yards in both legs.  She denies rest pain.  She has no ulcerations on  the feet.   REVIEW OF SYSTEMS:  She has dyspnea with exertion.  She denies any chest  pain.   PHYSICAL EXAMINATION:  Blood pressure 150/83 in the right arm, heart  rate is 108 and regular, respirations 20.  Chest:  Clear to  auscultation.  Cardiac:  Regular rate and rhythm without murmur.  Upper  extremities:  She has 2+ radial pulses bilaterally.  Neck:  No carotid  bruits.  Lower extremities:  She has a 1+ right femoral pulse.  She has  a 2+ left femoral pulse.  She has absent popliteal and pedal pulses  bilaterally.  Feet are pink, warm and well-perfused with brisk capillary  refill.  Abdomen:  Obese, soft, nontender, nondistended.  No masses.   I reviewed her ABIs and arterial duplex from September 07, 2010.  This  showed a decline in her left  ABI from 0.8 to 0.55.  The right ABI was  stable at 0.5.  Duplex shows greater than 75% stenosis of the  superficial femoral artery stent on the left side.   At this point the patient has had recurrence of stenosis in her left  lower extremity.  She has claudication symptoms as before.  I believe  the best option at this point would be to repeat her arteriogram and  possible redo angioplasty of her left lower extremity.  We will also  view her right lower extremity at the same time to make sure she has had  no significant changes.  Risks, benefits, possible complications and  procedure details were explained to the patient today including but not  limited to bleeding, infection, stent thrombosis, vessel injury.  She  understands and agrees to proceed.  Her arteriogram is scheduled for  Friday, January 20th.     [name]. Fields, MD  Electronically Signed   CEF/MEDQ  D:  11/15/2010  T:  11/16/2010  Job:  [id number]   cc:   Kari Baars, M.D.

## 2011-03-26 NOTE — Assessment & Plan Note (Signed)
OFFICE VISIT   [name], [name]  DOB:  1951/08/31                                       10/09/2009  ZOXWR#:[id number]   REASON FOR VISIT:  Follow up leg pain.   CHIEF COMPLAINT:  Leg pain.   HISTORY:  This is a 60 year old female who has undergone percutaneous  revascularization of her left leg for a focal occlusion in her  superficial femoral and popliteal artery.  This was done on January 10, 2009 in the setting of a wound on her left third toe.  This wound has  now healed.  Patient has had worsening pain in her legs and specifically  in her feet which happen more towards the end of the day and she  describes this as a burning feeling.  The patient is diabetic and has  had problems with neuropathy in the past.  She has been on Neurontin,  but has been switched to Lyrica due to weight loss.  She has been able  to stop smoking and has done so for the past 3 months, she continues to  be medically treated for her hypercholesterolemia.   REVIEW OF SYSTEMS:  Is positive for weight gain, shortness of breath,  reflux, hiatal hernia, frequent urination, pain in her legs with walking  and lying flat, arthritis, and muscle pain.  All other review of systems  are negative as documented in the encounter form.   PHYSICAL EXAMINATION:  Heart rate is 118, blood pressure 130/76,  temperature is 98.0.  General:  She is well-appearing in no acute  distress.  Head:  She is normocephalic, atraumatic.  EENT:  Pupils are  equal.  Sclerae anicteric.  Lungs:  Respirations are clear bilaterally.  Cardiovascular:  Regular rate and rhythm.  Abdomen is obese, soft,  nontender.  Musculoskeletal:  No major deformities.  Neuro:  No focal  deficits.  Psych:  Normal affect.  Skin:  No rashes.  Extremities are  warm and well-perfused.  She does not have palpable pedal pulses.  She  does not have any ulceration.   DIAGNOSTIC STUDIES:  Ultrasound was performed today.  This reveals an  ankle brachial index is decreased on the left from 0.64-0.48 and has  decreased on the right from 0.54-0.42.  There were no changes in the  velocity profile through the stents.  The stents are widely patent.   ASSESSMENT/PLAN:  Bilateral foot pain.   PLAN:  Based on the description of the patient's pain, this to me has  the appearance of a neuropathic type pain.  I have told her that I would  recommend increasing her Lyrica.  I do not think her pain is related to  arterial insufficiency, although she clearly has arterial disease.  She  is going to come back to see [place] in 3 months and we will repeat her  ultrasound then and see if she has had any benefit from increasing her  Lyrica.   Jorge Ny, MD  Electronically Signed   VWB/MEDQ  D:  10/09/2009  T:  10/10/2009  Job:  [id number]   cc:   Dr. Glenice Bow, [place].D.

## 2011-03-26 NOTE — Assessment & Plan Note (Signed)
OFFICE VISIT   DINEEN, CONRADT  DOB:  11-Jul-1951                                       01/04/2009  VWUJW#:[id number]   The patient is a 60 year old female who has had numbness in her right  foot for some time.  She also has developed some numbness in her left  foot.  Recently, the fourth toe became dusky approximately 2 weeks ago.  This was subsequently followed by the fifth toe becoming dusky and now  the first toe.  She has had pain intermittently in the left foot.  Her  atherosclerotic risk factors include tobacco abuse, diabetes.  She  denies history of hypertension or elevated cholesterol.  She has had  diabetes for 15 years.   PAST MEDICAL HISTORY:  Otherwise remarkable for an irregular heartbeat.  She did not know if this is atrial fibrillation.   PAST SURGICAL HISTORY:  She has had an appendectomy, cholecystectomy,  bilateral tubal ligation and left breast cancer.   MEDICATIONS:  1. Lantus insulin subcu 40 units in the evening.  2. Neurontin 300 mg 3 times a day.  3. Percocet p.r.n. foot pain.   She has no known drug allergies.   FAMILY HISTORY:  Unremarkable.   SOCIAL HISTORY:  She is widowed, has 3 children, smokes 1 pack of  cigarettes per day.   REVIEW OF SYSTEMS:  She has had some recent weight gain.  She is 5 feet  3 inches, 200 pounds.  CARDIAC:  She denies chest pain or shortness of breath but has had an  irregular heartbeat in the past.  She apparently had a cardiac  catheterization in 2002 which showed small vessel coronary disease but  no discrete lesions and was treated medically at that time.  She was  seen by [facility] during that hospital stay.  GI:  She has reflux, diarrhea and intermittent constipation.  RENAL:  She has urinary frequency.  VASCULAR:  As mentioned above, she denies history of TIA or stroke.  NEUROLOGIC:  She has some occasional dizziness.  ORTHOPEDIC:  She has occasional rashes on her skin.  PSYCHIATRIC:  She has some anxiety.  ENT/HEMATOLOGIC/PULMONARY:  Are all negative.   PHYSICAL EXAM:  Vital Signs:  Blood pressure 117/75, pulse is 109 and  regular.  HEENT:  Unremarkable.  Neck:  Has 2+ carotid pulses without  bruit.  Chest:  Clear to auscultation.  Cardiac:  Regular rate and  rhythm without murmur.  Abdomen:  Obese, soft, nontender, nondistended,  no masses.  Extremities:  She has 2+ brachial and radial pulses  bilaterally.  She is a 2+ right femoral pulse.  She has a vaguely  palpable left femoral pulse.  She has absent popliteal and pedal pulses  bilaterally.  The left fourth and fifth toes are dusky.  The left first  toe is dusky.  The left lateral foot has some dusky appearance as well.  There are no ulcerations on the feet.  There is no edema.   She had bilateral ABIs performed today which were 0.52 on the right and  0.54 on the left.  Wave forms were biphasic.  She had no digital signal  in the left toe.   I had a lengthy conversation with the patient today about smoking  cessation.  I discussed with her that she is at very high risk  of limb  loss in her left lower extremity.  I also discussed with her that the  combination of diabetes and smoking together is going to increase this  risk 100-fold.  I also gave her several options on smoking cessation  methods.   As far as her left lower extremity is concerned, I have ordered a CT  angiogram with runoff for tomorrow to evaluate her lower extremity  arterial tree.  We have also arranged for her to have a cardiac stress  test at [facility] on Friday.  We will determine what intervention is  necessary based on these two studies and plan for this to be early next  week.  Currently, the patient is on aspirin and she will continue to  take this.   Addendum:  CT angio shows distal SFA high grade stenosis vs short  occlusion.  Pt for arteriogram by Dr Myra Gianotti 01/10/09   [name]. Fields, MD  Electronically Signed    CEF/MEDQ  D:  01/05/2009  T:  01/05/2009  Job:  [id number]   cc:   Charlynne Pander, Dr.

## 2011-03-26 NOTE — Assessment & Plan Note (Signed)
OFFICE VISIT   SHERNELL, SALDIERNA J.  DOB:  1951-10-04                                       03/29/2009  ZOXWR#:[id number]   The patient returns for follow-up today.  She underwent left superficial  femoral and proximal popliteal artery stenting by Dr. Myra Gianotti  on March  2.  She returns today for reassessment of her toe.   On exam today there is dark eschar at the tip of the left third toe and  this looks like it is going to slough soon.  The other toes are pink,  warm and well-perfused.  She has a 1+ dorsalis pedis pulse in the left  foot.  Overall she looks well.  She is continuing to take an aspirin a  day.  She states she has quit smoking.  She will now follow up in our  surveillance protocol to make sure she has had no renarrowing of her  stent.  We will contact her from the office regarding her next  appointment.   [name]. Fields, MD  Electronically Signed   CEF/MEDQ  D:  03/29/2009  T:  03/30/2009  Job:  [id number]

## 2011-03-26 NOTE — Assessment & Plan Note (Signed)
OFFICE VISIT   MABRY, SANTARELLI  DOB:  [birth year]                                       01/03/2010  GMWNU#:[id number]   The patient presents for followup today.  She is a 60 year old female  who previously underwent angioplasty and stenting of her left  superficial femoral artery and popliteal artery in March of 2010.  She  was last seen by our partner, Dr. Myra Gianotti, in November of 2010 and at  that point complained of burning sensation in both feet.  At that time  it was thought to be primarily related to neuropathy.   Today she returns to the office after followup stent surveillance on  ultrasound.  She continues to complain of a burning sensation in both  feet.  She also states that sometimes the feet have a tingling  sensation.  She states that the right foot is worse than the left.  Of  note, she does have chronic back pain.  Unfortunately, she continues to  smoke about one pack of cigarettes per day.  Greater than 3 minutes  today were spent regarding smoking cessation counseling.  Several  techniques for smoking cessation and the benefits of this were also  discussed with the patient.   CHRONIC MEDICAL PROBLEMS:  Include diabetes and hypertension.  These are  currently well-controlled.   PAST SURGICAL HISTORY:  Appendectomy, tonsillectomy, tubal ligation,  cholecystectomy and an operation for breast cancer.   FAMILY HISTORY:  Remarkable for her father who had vascular disease at  age less than 44.   SOCIAL HISTORY:  She is widowed.  She has three children.  She is  retired.  Smoking history is as listed above.  She denies any alcohol  use.   REVIEW OF SYSTEMS:  A full 12 point review of systems was performed with  the patient today.  Please see intake referral form for details  regarding this.   PHYSICAL EXAM:  Vital signs:  Temperature is 98, oxygen saturation is  98% on room air, blood pressure 154/91 in the right arm, heart rate 114  and  regular.  HEENT:  Unremarkable.  Neck:  Has 2+ carotid pulses  without bruit.  Chest:  Clear to auscultation.  Cardiac:  Exam is  regular rate and rhythm without murmur.  Abdomen:  Is obese, soft,  nontender, nondistended.  No masses.  Chest:  Has decreased breath  sounds in the right base.  Left side is clear to auscultation.  Extremities:  She has no significant edema.  She has 1+ radial pulses  and 2+ femoral pulses bilaterally.  She has absent popliteal and pedal  pulses bilaterally.  The skin is thickened in the lower extremities  bilaterally.  There are no open ulcerations.  There is no cracking of  the skin.  She does have some hair loss on the lower leg and foot  bilaterally.  She has no cervical, axillary or inguinal lymphadenopathy.  Neurological:  Exam shows symmetric upper extremity and lower extremity  motor strength which is 5/5.   She had a duplex exam of her lower extremities as well as her  superficial femoral artery and popliteal stents today.  ABIs today were  0.46 on the left and 0.42 on the right.  She did have decreased  velocities as low as 25 cm/sec in the distal section of  her popliteal  stent.  ABIs were unchanged from November of 2010.   In summary, I believe the patient does have a large component of  neuropathy which is causing her lower extremity symptoms.  However,  since she does have fairly decreased velocities in her stents in the  left leg I believe that this warrants a repeat arteriogram to make sure  that she is not developing restenosis of her stents.  We have scheduled  her arteriogram for Friday 01/12/2010.  I discussed with her today the  risks, benefits, possible complications of the procedure including but  not limited to bleeding, infection, vessel injury.  I also discussed  with her the possibility that we would consider reangioplasty and  stenting if necessary.  We will do the stick in the right groin and  cross over to the left side in the  event that an intervention might be  necessary.  She will continue her Plavix.     [name]. Fields, MD  Electronically Signed   CEF/MEDQ  D:  01/03/2010  T:  01/04/2010  Job:  [id number]   cc:   Kari Baars, M.D.

## 2011-03-26 NOTE — Op Note (Signed)
NAMELEAHANN, LEMPKE                 ACCOUNT NO.:  [id number]   MEDICAL RECORD NO.:  [id number]          PATIENT TYPE:  AMB   LOCATION:  SDS                          FACILITY:  [facility]   PHYSICIAN:  VDurene Cal IV, MDDATE OF BIRTH:  1950/12/10   DATE OF PROCEDURE:  01/10/2009  DATE OF DISCHARGE:                               OPERATIVE REPORT   PREOPERATIVE DIAGNOSIS:  Ischemic left toe.   POSTOPERATIVE DIAGNOSIS:  Ischemic left toe.   PROCEDURE PERFORMED:  1. Ultrasound access, right common femoral artery.  2. Abdominal aortogram.  3. Bilateral lower extremity runoff.  4. Third-order catheterization.  5. Stent, left superficial femoral artery.  6. Stent, left popliteal artery.  7. Conscious sedation x1 hour from 10 to 11.   INDICATIONS:  This is a 60 year old female who has had several week  history of discoloration in her left toe.  She also complaints of  numbness.  This was thought to be embolic as evidence by CT angiogram.  She comes in today for possible intervention.   PROCEDURE:  The patient was identified in the holding area and taken to  room 8, and she was placed supine on the table.  Bilateral groins were  prepped and draped in a standard sterile fashion.  A time-out was  called.  A 1% lidocaine was used for local anesthesia.  The right  femoral artery was accessed under ultrasound guidance with an 18-guage  needle.  An 0.035 Teena Dunk wire was advanced in retrograde fashion to the  abdominal aorta under fluoroscopic visualization.  A 5-French sheath was  placed.  An Omni flush catheter was placed at the level of L1 and  abdominal aortogram was obtained.  Next, the aortic bifurcation was  crossed with the Omni flush catheter and a Benson wire.  The catheter  was placed in the left external iliac artery and left lower extremity  runoff was obtained.  After intervention, retrograde sheath images were  performed to evaluate the right leg.   FINDINGS:  Aortogram:  The  visualized portions of the suprarenal  abdominal aorta showed minimal disease.  There is single renal arteries,  which are widely patent.  There is an accessory left renal artery.  The  infrarenal abdominal aorta was widely patent with minimal disease.  Bilateral common iliac and external iliac arteries are widely patent  with minimal disease.  Bilateral hypogastric arteries were widely  patent.   The Left Lower Extremity:  The left common femoral artery is widely  patent with minimal disease.  The left profunda femoral artery is small  in size, but widely patent.  The left superficial femoral artery, there  is a focal occlusion with filling defect just proximal to the adductor  canal.  This is worrisome for thrombus.  The popliteal artery is patent  throughout its course.  There is mild diffuse disease in the distal  popliteal artery as well as tibial disease.  The patient does have 3-  vessel runoff down to the ankle and the dominant vessel across the foot  is the anterior tibial.  The plantar  arch is intact.   The Right Lower Extremity:  The right common femoral artery is widely  patent.  The right profunda femoral artery is patent.  The right  superficial femoral artery is diffusely diseased with a focal occlusion  and just proximal to the adductor canal.  Popliteal artery is patent  throughout its course.  The patient has tibial disease.  The tibial  vessels are not well imaged secondary to the patient movement and  contrast timing.   Intervention:  After the above images were obtained, decision was made  to intervene.  A 7-French Terumo sheath was placed into the left  external iliac artery.  At this point, the patient was given systemic  heparinization.  I used a Glidewire and a Quick-Cross catheter to cross  the lesion.  A contrast image was performed to ensure that I had  remained intraluminal.  Due to the nature of the lesion, I was concerned  that there was thrombus in this  area therefore, I elected to place a  filter.  A 6-mm spider filter was advanced through the Quick-Cross  catheter and deployed in the popliteal artery behind the knee.  Over the  wire, I then selected a EV3 6 x 80 self-expanding stent.  This was  deployed in the proximal popliteal and distal superficial femoral  artery.  A 5-mm FoxCross balloon was used to mold the stent.  Followup  images revealed resolution of the stenosis and runoff similar to prior  to intervention.  After the above images were obtained, decision was  made to terminate the procedure.  Catheters and wires removed.  The  patient to be taken to the holding area for sheath pull.   IMPRESSION:  1. 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  2. Short segment focal occlusion in the right superficial femoral      artery.           ______________________________  V. Charlena Cross, MD  Electronically Signed     VWB/MEDQ  D:  01/10/2009  T:  01/11/2009  Job:  [id number]

## 2011-03-26 NOTE — Procedures (Signed)
BYPASS GRAFT EVALUATION   INDICATION:  Follow-up evaluation of left lower extremity stents.   HISTORY:  Diabetes:  Yes.  Cardiac:  No.  Hypertension:  No.  Smoking:  Yes.  Previous Surgery:  Left superficial femoral and popliteal artery stents  on 01/10/09 by Dr. Myra Gianotti.   SINGLE LEVEL ARTERIAL EXAM                               RIGHT              LEFT  Brachial:                    174                Mastectomy  Anterior tibial:             73                 67  Posterior tibial:            63                 83  Peroneal:  Ankle/brachial index:        0.42               0.48   PREVIOUS ABI:  Date: 06/07/09  RIGHT:  0.54  LEFT:  0.64   LOWER EXTREMITY BYPASS GRAFT DUPLEX EXAM:   DUPLEX:  There are biphasic and monophasic waveforms noted in the left  superficial femoral artery and popliteal artery with no focal stenosis  noted.   IMPRESSION:  1. Ankle brachial indices appear monophasic with moderate-to-severe      arterial disease.  There has been a drop since previous study.  2. The left superficial femoral and popliteal artery appears patent.     ___________________________________________  [name]. Fields, MD   CB/MEDQ  D:  09/27/2009  T:  09/27/2009  Job:  [id number]

## 2011-03-26 NOTE — Assessment & Plan Note (Signed)
OFFICE VISIT   [name], [name]  DOB:  03-27-1951                                       02/09/2009  ZOXWR#:[id number]   This patient returns for follow-up today.  She underwent lower extremity  arteriogram with left superficial femoral artery and proximal popliteal  artery stenting on March 2 by Dr. Myra Gianotti.  She returns to the office  today for further follow-up.  She states she still has some occasional  pain in the left first and third toes but overall this is improving.  I  reviewed her arteriogram which showed a patent left superficial femoral  and proximal popliteal artery stent.  She has 3-vessel runoff to the  left foot.  However, all 3 tibial vessels have some element of disease  and this is more severe in the anterior tibial artery.   She also has evidence of the right superficial femoral artery occlusion  with more diffuse tibial disease on the right side.  No intervention was  performed on the right side at this time.  She complains of intermittent  numbness and tingling in the right foot.  However, ABI on the right side  is reasonable and I believe this is more due to neuropathy.   Unfortunately she continues to smoke.  Her other primary risk factor is  diabetes.   PHYSICAL EXAMINATION:  Today blood pressure is 136/79,  pulse is 112 and  regular.  Lower extremities:  She has a 2+ left and right femoral pulse.  I am unable to palpate popliteal or pedal pulses bilaterally.  The left  foot and right foot are both pink, warm and well-perfused.  She has a  healing first toe on the left foot with several areas of dry skin where  the edema has improved in the foot.  She has dry gangrenous changes of  the tip of the left fourth toe but overall this appears to be healing as  well.  Her ABIs improved from 0.54 to 0.73 in the left leg.  She is  currently taking aspirin 81 mg once a day.   I counseled [name] at length today about smoking cessation and  risk of  limb loss in the future.  Also emphasized to her that her risk of limb  loss is 100 times greater with combination of diabetes and smoking.  She  is also given a brochure on smoking cessation at the [facility].  I believe that she should have adequate perfusion in  the left leg now  to heal the wounds in her left foot.  I do not believe  she needs intervention in the right leg for now, she develops  ulcerations or nonhealing wounds on the right side or rest pain.  She  does not have any these criteria currently.  She will follow up with me  in six weeks' time to make sure the left foot is completely healed.  We  will then place her in our surveillance protocol where she will get  intermittent scans to make sure that her stent is remaining patent.   [name]. Fields, MD  Electronically Signed   CEF/MEDQ  D:  02/09/2009  T:  02/13/2009  Job:  [id number]   cc:   Charlynne Pander

## 2011-04-12 ENCOUNTER — Encounter: Payer: Self-pay | Admitting: Vascular Surgery

## 2011-04-12 DIAGNOSIS — I252 Old myocardial infarction: Secondary | ICD-10-CM

## 2011-04-12 HISTORY — DX: Old myocardial infarction: I25.2

## 2011-04-29 ENCOUNTER — Ambulatory Visit (INDEPENDENT_AMBULATORY_CARE_PROVIDER_SITE_OTHER): Payer: Medicare Other | Admitting: Vascular Surgery

## 2011-04-29 DIAGNOSIS — L98499 Non-pressure chronic ulcer of skin of other sites with unspecified severity: Secondary | ICD-10-CM

## 2011-04-29 NOTE — Assessment & Plan Note (Signed)
OFFICE VISIT  [name], [name] DOB:  1951-01-16                                       04/29/2011 ZOXWR#:[id number]  Patient returns today because of a new ulceration at the base of the right first toe.  She was scheduled to see Dr. Darrick Penna next week for follow-up regarding both lower extremities.  He had ordered arterial Doppler studies to be performed at that time.  About a week or 2 ago, patient developed a small ulceration on the plantar surface of her right first toe.  She has had no chills, fever, purulent drainage, or evidence of infection.  She does continue to have some occasional chest discomfort and dyspnea on exertion, which is not a new finding.  CHRONIC MEDICAL PROBLEMS:  Include diabetes and hypertension, which are well controlled.  SOCIAL HISTORY:  She is widowed and has 3 children.  She is retired. Smoking history is as listed previously, smoking about 1 pack of cigarettes per day.  Does not use alcohol.  PHYSICAL EXAMINATION:  Blood pressure is 150/70, heart rate 100, respirations 20.  General:  She is an obese female who is in no apparent distress, alert and oriented x3.  Chest:  Clear to auscultation.  Lower extremity exam reveals 2+ to 3+ femoral pulses bilaterally with no popliteal or distal pulses.  Right leg has a circular ulceration which is punctate and measures about 1 cm in diameter at the base of the right first toe on the plantar surface with no cellulitis or purulence.  No exposed bone.  Previous angiograms reveal that she did have a right superficial femoral occlusion with the above knee popliteal artery patent in February of this year.  She is probably a candidate for femoral-popliteal grafting. She will return this Thursday to see Dr. Darrick Penna to further discuss and possibly schedule that.  She will have lower extremity arterial Dopplers performed at that time she sees Dr. Darrick Penna.    Quita Skye Hart [place], M.D. Electronically  Signed  JDL/MEDQ  D:  04/29/2011  T:  04/29/2011  Job:  [id number]

## 2011-05-02 ENCOUNTER — Inpatient Hospital Stay (HOSPITAL_COMMUNITY): Payer: Medicare Other

## 2011-05-02 ENCOUNTER — Ambulatory Visit: Payer: Medicare Other | Admitting: Vascular Surgery

## 2011-05-02 ENCOUNTER — Inpatient Hospital Stay (HOSPITAL_COMMUNITY)
Admission: AD | Admit: 2011-05-02 | Discharge: 2011-05-14 | DRG: 246 | Disposition: A | Discharge status: Home Health | Payer: Medicare Other | Source: Other Acute Inpatient Hospital | Attending: Internal Medicine | Admitting: Internal Medicine

## 2011-05-02 DIAGNOSIS — J9620 Acute and chronic respiratory failure, unspecified whether with hypoxia or hypercapnia: Secondary | ICD-10-CM | POA: Diagnosis present

## 2011-05-02 DIAGNOSIS — J441 Chronic obstructive pulmonary disease with (acute) exacerbation: Secondary | ICD-10-CM | POA: Diagnosis present

## 2011-05-02 DIAGNOSIS — E1169 Type 2 diabetes mellitus with other specified complication: Secondary | ICD-10-CM | POA: Diagnosis present

## 2011-05-02 DIAGNOSIS — I739 Peripheral vascular disease, unspecified: Secondary | ICD-10-CM | POA: Diagnosis present

## 2011-05-02 DIAGNOSIS — Z7982 Long term (current) use of aspirin: Secondary | ICD-10-CM

## 2011-05-02 DIAGNOSIS — E662 Morbid (severe) obesity with alveolar hypoventilation: Secondary | ICD-10-CM | POA: Diagnosis present

## 2011-05-02 DIAGNOSIS — E669 Obesity, unspecified: Secondary | ICD-10-CM | POA: Diagnosis present

## 2011-05-02 DIAGNOSIS — I509 Heart failure, unspecified: Secondary | ICD-10-CM | POA: Diagnosis present

## 2011-05-02 DIAGNOSIS — G4733 Obstructive sleep apnea (adult) (pediatric): Secondary | ICD-10-CM | POA: Diagnosis present

## 2011-05-02 DIAGNOSIS — Z6841 Body Mass Index (BMI) 40.0 and over, adult: Secondary | ICD-10-CM

## 2011-05-02 DIAGNOSIS — N289 Disorder of kidney and ureter, unspecified: Secondary | ICD-10-CM | POA: Diagnosis present

## 2011-05-02 DIAGNOSIS — Z794 Long term (current) use of insulin: Secondary | ICD-10-CM

## 2011-05-02 DIAGNOSIS — L97409 Non-pressure chronic ulcer of unspecified heel and midfoot with unspecified severity: Secondary | ICD-10-CM | POA: Diagnosis present

## 2011-05-02 DIAGNOSIS — I5043 Acute on chronic combined systolic (congestive) and diastolic (congestive) heart failure: Secondary | ICD-10-CM | POA: Diagnosis present

## 2011-05-02 DIAGNOSIS — Z7902 Long term (current) use of antithrombotics/antiplatelets: Secondary | ICD-10-CM

## 2011-05-02 DIAGNOSIS — I2589 Other forms of chronic ischemic heart disease: Secondary | ICD-10-CM | POA: Diagnosis present

## 2011-05-02 DIAGNOSIS — J189 Pneumonia, unspecified organism: Secondary | ICD-10-CM | POA: Diagnosis not present

## 2011-05-02 DIAGNOSIS — I214 Non-ST elevation (NSTEMI) myocardial infarction: Principal | ICD-10-CM | POA: Diagnosis present

## 2011-05-02 DIAGNOSIS — F17200 Nicotine dependence, unspecified, uncomplicated: Secondary | ICD-10-CM | POA: Diagnosis present

## 2011-05-02 DIAGNOSIS — I2510 Atherosclerotic heart disease of native coronary artery without angina pectoris: Secondary | ICD-10-CM

## 2011-05-02 LAB — GLUCOSE, CAPILLARY

## 2011-05-02 LAB — URINALYSIS, MICROSCOPIC ONLY
Nitrite: NEGATIVE
Protein, ur: 100 mg/dL — AB
Specific Gravity, Urine: 1.039 — ABNORMAL HIGH (ref 1.005–1.030)
Urobilinogen, UA: 0.2 mg/dL (ref 0.0–1.0)

## 2011-05-02 LAB — MRSA PCR SCREENING: MRSA by PCR: NEGATIVE

## 2011-05-03 DIAGNOSIS — I059 Rheumatic mitral valve disease, unspecified: Secondary | ICD-10-CM

## 2011-05-03 DIAGNOSIS — I5023 Acute on chronic systolic (congestive) heart failure: Secondary | ICD-10-CM

## 2011-05-03 LAB — BASIC METABOLIC PANEL
BUN: 19 mg/dL (ref 6–23)
CO2: 30 mEq/L (ref 19–32)
Calcium: 8.1 mg/dL — ABNORMAL LOW (ref 8.4–10.5)
Chloride: 98 mEq/L (ref 96–112)
Creatinine, Ser: 0.68 mg/dL (ref 0.50–1.10)

## 2011-05-03 LAB — GLUCOSE, CAPILLARY
Glucose-Capillary: 305 mg/dL — ABNORMAL HIGH (ref 70–99)
Glucose-Capillary: 285 mg/dL — ABNORMAL HIGH (ref 70–99)

## 2011-05-03 LAB — CBC
HCT: 33.2 % — ABNORMAL LOW (ref 36.0–46.0)
MCV: 90 fL (ref 78.0–100.0)
Platelets: 282 10*3/uL (ref 150–400)
RBC: 3.69 MIL/uL — ABNORMAL LOW (ref 3.87–5.11)
RDW: 12.6 % (ref 11.5–15.5)
WBC: 7.9 10*3/uL (ref 4.0–10.5)

## 2011-05-03 LAB — DIFFERENTIAL
Basophils Absolute: 0 10*3/uL (ref 0.0–0.1)
Eosinophils Absolute: 0.1 10*3/uL (ref 0.0–0.7)
Eosinophils Relative: 2 % (ref 0–5)
Lymphocytes Relative: 16 % (ref 12–46)
Lymphs Abs: 1.3 10*3/uL (ref 0.7–4.0)
Neutrophils Relative %: 70 % (ref 43–77)

## 2011-05-03 LAB — LIPID PANEL
Cholesterol: 186 mg/dL (ref 0–200)
Cholesterol: 186 mg/dL (ref 0–200)
HDL: 40 mg/dL (ref >39)
LDL Cholesterol: 115 mg/dL — ABNORMAL HIGH (ref 0–99)
LDL Cholesterol: 116 mg/dL — ABNORMAL HIGH (ref 0–99)
Total CHOL/HDL Ratio: 4.7 RATIO
Triglycerides: 154 mg/dL — ABNORMAL HIGH (ref <150)
VLDL: 30 mg/dL (ref 0–40)

## 2011-05-03 LAB — CARDIAC PANEL(CRET KIN+CKTOT+MB+TROPI)
CK, MB: 4.1 ng/mL — ABNORMAL HIGH (ref 0.3–4.0)
Total CK: 76 U/L (ref 7–177)
Troponin I: 1.46 ng/mL (ref <0.30)

## 2011-05-04 ENCOUNTER — Inpatient Hospital Stay (HOSPITAL_COMMUNITY): Payer: Medicare Other

## 2011-05-04 LAB — GLUCOSE, CAPILLARY
Glucose-Capillary: 307 mg/dL — ABNORMAL HIGH (ref 70–99)
Glucose-Capillary: 261 mg/dL — ABNORMAL HIGH (ref 70–99)
Glucose-Capillary: 307 mg/dL — ABNORMAL HIGH (ref 70–99)
Glucose-Capillary: 238 mg/dL — ABNORMAL HIGH (ref 70–99)

## 2011-05-04 LAB — DIFFERENTIAL
Basophils Relative: 0 % (ref 0–1)
Monocytes Relative: 12 % (ref 3–12)
Neutro Abs: 6.2 10*3/uL (ref 1.7–7.7)
Neutrophils Relative %: 71 % (ref 43–77)

## 2011-05-04 LAB — CBC
Hemoglobin: 10.9 g/dL — ABNORMAL LOW (ref 12.0–15.0)
MCH: 29.8 pg (ref 26.0–34.0)
RBC: 3.66 MIL/uL — ABNORMAL LOW (ref 3.87–5.11)
WBC: 8.7 10*3/uL (ref 4.0–10.5)

## 2011-05-04 LAB — BASIC METABOLIC PANEL
CO2: 31 mEq/L (ref 19–32)
Calcium: 7.8 mg/dL — ABNORMAL LOW (ref 8.4–10.5)
Chloride: 96 mEq/L (ref 96–112)
Glucose, Bld: 289 mg/dL — ABNORMAL HIGH (ref 70–99)
Potassium: 4.7 mEq/L (ref 3.5–5.1)
Sodium: 135 mEq/L (ref 135–145)

## 2011-05-04 LAB — PRO B NATRIURETIC PEPTIDE: Pro B Natriuretic peptide (BNP): 3096 pg/mL — ABNORMAL HIGH (ref 0–125)

## 2011-05-04 LAB — T3, FREE: T3, Free: 3 pg/mL (ref 2.3–4.2)

## 2011-05-04 LAB — T4, FREE: Free T4: 0.99 ng/dL (ref 0.80–1.80)

## 2011-05-05 DIAGNOSIS — J984 Other disorders of lung: Secondary | ICD-10-CM

## 2011-05-05 LAB — BASIC METABOLIC PANEL
Calcium: 7.6 mg/dL — ABNORMAL LOW (ref 8.4–10.5)
GFR calc Af Amer: >60 mL/min (ref >60)
GFR calc non Af Amer: >60 mL/min (ref >60)
Potassium: 4.2 mEq/L (ref 3.5–5.1)
Sodium: 139 mEq/L (ref 135–145)

## 2011-05-05 LAB — LEGIONELLA ANTIGEN, URINE: Legionella Antigen, Urine: NEGATIVE

## 2011-05-05 LAB — GLUCOSE, CAPILLARY
Glucose-Capillary: 142 mg/dL — ABNORMAL HIGH (ref 70–99)
Glucose-Capillary: 174 mg/dL — ABNORMAL HIGH (ref 70–99)

## 2011-05-05 LAB — CBC
Hemoglobin: 10.1 g/dL — ABNORMAL LOW (ref 12.0–15.0)
Platelets: 256 10*3/uL (ref 150–400)
RBC: 3.5 MIL/uL — ABNORMAL LOW (ref 3.87–5.11)
WBC: 8.7 10*3/uL (ref 4.0–10.5)

## 2011-05-06 ENCOUNTER — Inpatient Hospital Stay (HOSPITAL_COMMUNITY): Payer: Medicare Other

## 2011-05-06 DIAGNOSIS — I5031 Acute diastolic (congestive) heart failure: Secondary | ICD-10-CM

## 2011-05-06 LAB — EXPECTORATED SPUTUM ASSESSMENT W GRAM STAIN, RFLX TO RESP C

## 2011-05-06 LAB — BLOOD GAS, ARTERIAL
Drawn by: 331001
Expiratory PAP: 7
Inspiratory PAP: 14
RATE: 10 resp/min
pCO2 arterial: 61.3 mmHg (ref 35.0–45.0)
pH, Arterial: 7.331 — ABNORMAL LOW (ref 7.350–7.400)
pO2, Arterial: 77.6 mmHg — ABNORMAL LOW (ref 80.0–100.0)

## 2011-05-06 LAB — GLUCOSE, CAPILLARY
Glucose-Capillary: 236 mg/dL — ABNORMAL HIGH (ref 70–99)
Glucose-Capillary: 268 mg/dL — ABNORMAL HIGH (ref 70–99)
Glucose-Capillary: 158 mg/dL — ABNORMAL HIGH (ref 70–99)

## 2011-05-06 LAB — CBC
HCT: 33.8 % — ABNORMAL LOW (ref 36.0–46.0)
Hemoglobin: 10.7 g/dL — ABNORMAL LOW (ref 12.0–15.0)
MCH: 29.1 pg (ref 26.0–34.0)
MCHC: 31.7 g/dL (ref 30.0–36.0)
MCV: 91.8 fL (ref 78.0–100.0)
Platelets: 255 10*3/uL (ref 150–400)
RBC: 3.68 MIL/uL — ABNORMAL LOW (ref 3.87–5.11)
RDW: 12.6 % (ref 11.5–15.5)
WBC: 9.5 10*3/uL (ref 4.0–10.5)

## 2011-05-06 LAB — BASIC METABOLIC PANEL
BUN: 28 mg/dL — ABNORMAL HIGH (ref 6–23)
CO2: 32 mEq/L (ref 19–32)
Calcium: 7.4 mg/dL — ABNORMAL LOW (ref 8.4–10.5)
Chloride: 96 mEq/L (ref 96–112)
Creatinine, Ser: 0.66 mg/dL (ref 0.50–1.10)

## 2011-05-07 ENCOUNTER — Inpatient Hospital Stay (HOSPITAL_COMMUNITY): Payer: Medicare Other

## 2011-05-07 DIAGNOSIS — J9620 Acute and chronic respiratory failure, unspecified whether with hypoxia or hypercapnia: Secondary | ICD-10-CM

## 2011-05-07 DIAGNOSIS — J189 Pneumonia, unspecified organism: Secondary | ICD-10-CM

## 2011-05-07 LAB — BASIC METABOLIC PANEL
BUN: 25 mg/dL — ABNORMAL HIGH (ref 6–23)
Chloride: 98 mEq/L (ref 96–112)
GFR calc Af Amer: >60 mL/min (ref >60)
GFR calc non Af Amer: >60 mL/min (ref >60)
Glucose, Bld: 87 mg/dL (ref 70–99)
Potassium: 3.8 mEq/L (ref 3.5–5.1)
Sodium: 139 mEq/L (ref 135–145)

## 2011-05-07 LAB — CBC
HCT: 31.7 % — ABNORMAL LOW (ref 36.0–46.0)
MCH: 28.9 pg (ref 26.0–34.0)
MCV: 90.8 fL (ref 78.0–100.0)
Platelets: 225 10*3/uL (ref 150–400)
RDW: 12.5 % (ref 11.5–15.5)

## 2011-05-07 LAB — GLUCOSE, CAPILLARY
Glucose-Capillary: 157 mg/dL — ABNORMAL HIGH (ref 70–99)
Glucose-Capillary: 195 mg/dL — ABNORMAL HIGH (ref 70–99)
Glucose-Capillary: 228 mg/dL — ABNORMAL HIGH (ref 70–99)

## 2011-05-08 ENCOUNTER — Inpatient Hospital Stay (HOSPITAL_COMMUNITY): Payer: Medicare Other

## 2011-05-08 DIAGNOSIS — J441 Chronic obstructive pulmonary disease with (acute) exacerbation: Secondary | ICD-10-CM

## 2011-05-08 DIAGNOSIS — G4730 Sleep apnea, unspecified: Secondary | ICD-10-CM

## 2011-05-08 DIAGNOSIS — J9620 Acute and chronic respiratory failure, unspecified whether with hypoxia or hypercapnia: Secondary | ICD-10-CM

## 2011-05-08 DIAGNOSIS — G4710 Hypersomnia, unspecified: Secondary | ICD-10-CM

## 2011-05-08 DIAGNOSIS — G4736 Sleep related hypoventilation in conditions classified elsewhere: Secondary | ICD-10-CM

## 2011-05-08 LAB — GLUCOSE, CAPILLARY
Glucose-Capillary: 103 mg/dL — ABNORMAL HIGH (ref 70–99)
Glucose-Capillary: 188 mg/dL — ABNORMAL HIGH (ref 70–99)

## 2011-05-08 LAB — BASIC METABOLIC PANEL
BUN: 18 mg/dL (ref 6–23)
CO2: 36 mEq/L — ABNORMAL HIGH (ref 19–32)
Calcium: 8.3 mg/dL — ABNORMAL LOW (ref 8.4–10.5)
Chloride: 98 mEq/L (ref 96–112)
Creatinine, Ser: 0.62 mg/dL (ref 0.50–1.10)

## 2011-05-08 LAB — POCT ACTIVATED CLOTTING TIME: Activated Clotting Time: 331 seconds

## 2011-05-08 LAB — PRO B NATRIURETIC PEPTIDE: Pro B Natriuretic peptide (BNP): 2488 pg/mL — ABNORMAL HIGH (ref 0–125)

## 2011-05-08 LAB — SEDIMENTATION RATE: Sed Rate: 105 mm/hr — ABNORMAL HIGH (ref 0–22)

## 2011-05-09 ENCOUNTER — Ambulatory Visit: Payer: Medicare Other | Admitting: Vascular Surgery

## 2011-05-09 DIAGNOSIS — N289 Disorder of kidney and ureter, unspecified: Secondary | ICD-10-CM

## 2011-05-09 LAB — BASIC METABOLIC PANEL
BUN: 20 mg/dL (ref 6–23)
CO2: 32 mEq/L (ref 19–32)
Calcium: 7.8 mg/dL — ABNORMAL LOW (ref 8.4–10.5)
Chloride: 100 mEq/L (ref 96–112)
Chloride: 102 mEq/L (ref 96–112)
Creatinine, Ser: 1.35 mg/dL — ABNORMAL HIGH (ref 0.50–1.10)
Creatinine, Ser: 1.34 mg/dL — ABNORMAL HIGH (ref 0.50–1.10)
GFR calc Af Amer: 48 mL/min — ABNORMAL LOW (ref >60)
GFR calc Af Amer: 49 mL/min — ABNORMAL LOW (ref >60)
Potassium: 4.1 mEq/L (ref 3.5–5.1)
Sodium: 139 mEq/L (ref 135–145)

## 2011-05-09 LAB — CULTURE, RESPIRATORY W GRAM STAIN

## 2011-05-09 LAB — CULTURE, BLOOD (ROUTINE X 2)
Culture  Setup Time: 201206220218
Culture: NO GROWTH

## 2011-05-09 LAB — PLATELET INHIBITION P2Y12
P2Y12 % Inhibition: 37 %
Platelet Function  P2Y12: 224 [PRU] (ref 194–418)
Platelet Function Baseline: 355 [PRU] (ref 194–418)

## 2011-05-09 LAB — CBC
Platelets: 227 10*3/uL (ref 150–400)
RBC: 3.67 MIL/uL — ABNORMAL LOW (ref 3.87–5.11)
RDW: 12.3 % (ref 11.5–15.5)
WBC: 8.7 10*3/uL (ref 4.0–10.5)

## 2011-05-09 LAB — GLUCOSE, CAPILLARY
Glucose-Capillary: 151 mg/dL — ABNORMAL HIGH (ref 70–99)
Glucose-Capillary: 159 mg/dL — ABNORMAL HIGH (ref 70–99)

## 2011-05-10 LAB — BASIC METABOLIC PANEL
BUN: 22 mg/dL (ref 6–23)
Calcium: 8.6 mg/dL (ref 8.4–10.5)
Chloride: 101 mEq/L (ref 96–112)
Creatinine, Ser: 1.71 mg/dL — ABNORMAL HIGH (ref 0.50–1.10)
GFR calc Af Amer: 37 mL/min — ABNORMAL LOW (ref >60)
GFR calc non Af Amer: 30 mL/min — ABNORMAL LOW (ref >60)

## 2011-05-10 LAB — GLUCOSE, CAPILLARY: Glucose-Capillary: 155 mg/dL — ABNORMAL HIGH (ref 70–99)

## 2011-05-11 DIAGNOSIS — I214 Non-ST elevation (NSTEMI) myocardial infarction: Secondary | ICD-10-CM

## 2011-05-11 LAB — GLUCOSE, CAPILLARY
Glucose-Capillary: 55 mg/dL — ABNORMAL LOW (ref 70–99)
Glucose-Capillary: 110 mg/dL — ABNORMAL HIGH (ref 70–99)
Glucose-Capillary: 182 mg/dL — ABNORMAL HIGH (ref 70–99)

## 2011-05-11 LAB — BASIC METABOLIC PANEL
BUN: 24 mg/dL — ABNORMAL HIGH (ref 6–23)
CO2: 29 mEq/L (ref 19–32)
Calcium: 9.2 mg/dL (ref 8.4–10.5)
Chloride: 103 mEq/L (ref 96–112)
Creatinine, Ser: 1.57 mg/dL — ABNORMAL HIGH (ref 0.50–1.10)
Glucose, Bld: 105 mg/dL — ABNORMAL HIGH (ref 70–99)

## 2011-05-11 LAB — CULTURE, BLOOD (ROUTINE X 2)
Culture  Setup Time: 201206241738
Culture  Setup Time: 201206241739
Culture: NO GROWTH

## 2011-05-12 LAB — CBC
HCT: 37.4 % (ref 36.0–46.0)
Hemoglobin: 12.4 g/dL (ref 12.0–15.0)
MCH: 29.2 pg (ref 26.0–34.0)
MCHC: 33.2 g/dL (ref 30.0–36.0)
RBC: 4.25 MIL/uL (ref 3.87–5.11)

## 2011-05-12 LAB — GLUCOSE, CAPILLARY
Glucose-Capillary: 137 mg/dL — ABNORMAL HIGH (ref 70–99)
Glucose-Capillary: 148 mg/dL — ABNORMAL HIGH (ref 70–99)

## 2011-05-12 LAB — BASIC METABOLIC PANEL
BUN: 23 mg/dL (ref 6–23)
CO2: 28 mEq/L (ref 19–32)
Calcium: 9.3 mg/dL (ref 8.4–10.5)
Glucose, Bld: 166 mg/dL — ABNORMAL HIGH (ref 70–99)
Potassium: 4 mEq/L (ref 3.5–5.1)
Sodium: 141 mEq/L (ref 135–145)

## 2011-05-13 DIAGNOSIS — G4710 Hypersomnia, unspecified: Secondary | ICD-10-CM

## 2011-05-13 DIAGNOSIS — G4730 Sleep apnea, unspecified: Secondary | ICD-10-CM

## 2011-05-13 DIAGNOSIS — J441 Chronic obstructive pulmonary disease with (acute) exacerbation: Secondary | ICD-10-CM

## 2011-05-13 DIAGNOSIS — J9620 Acute and chronic respiratory failure, unspecified whether with hypoxia or hypercapnia: Secondary | ICD-10-CM

## 2011-05-13 LAB — BASIC METABOLIC PANEL
CO2: 27 mEq/L (ref 19–32)
Calcium: 9.1 mg/dL (ref 8.4–10.5)
Creatinine, Ser: 1.21 mg/dL — ABNORMAL HIGH (ref 0.50–1.10)
GFR calc non Af Amer: 45 mL/min — ABNORMAL LOW (ref >60)
Glucose, Bld: 178 mg/dL — ABNORMAL HIGH (ref 70–99)

## 2011-05-13 LAB — GLUCOSE, CAPILLARY: Glucose-Capillary: 272 mg/dL — ABNORMAL HIGH (ref 70–99)

## 2011-05-14 DIAGNOSIS — G4736 Sleep related hypoventilation in conditions classified elsewhere: Secondary | ICD-10-CM

## 2011-05-14 LAB — GLUCOSE, CAPILLARY: Glucose-Capillary: 158 mg/dL — ABNORMAL HIGH (ref 70–99)

## 2011-05-16 ENCOUNTER — Telehealth: Payer: Self-pay | Admitting: Cardiology

## 2011-05-16 NOTE — Telephone Encounter (Signed)
Spoke w/pharmacist confirmed that pt was ordered Combivent per hospital d/c he will fill rx

## 2011-05-16 NOTE — Telephone Encounter (Signed)
Pharmacist has question re pt inhalers. Pharmacist would like to talk to someone re pt meds.

## 2011-05-22 ENCOUNTER — Telehealth: Payer: Self-pay | Admitting: *Deleted

## 2011-05-26 ENCOUNTER — Ambulatory Visit (HOSPITAL_BASED_OUTPATIENT_CLINIC_OR_DEPARTMENT_OTHER): Payer: Medicare Other | Attending: Pulmonary Disease

## 2011-05-26 DIAGNOSIS — I2510 Atherosclerotic heart disease of native coronary artery without angina pectoris: Secondary | ICD-10-CM | POA: Insufficient documentation

## 2011-05-26 DIAGNOSIS — Z79899 Other long term (current) drug therapy: Secondary | ICD-10-CM | POA: Insufficient documentation

## 2011-05-26 DIAGNOSIS — I509 Heart failure, unspecified: Secondary | ICD-10-CM | POA: Insufficient documentation

## 2011-05-26 DIAGNOSIS — Z7982 Long term (current) use of aspirin: Secondary | ICD-10-CM | POA: Insufficient documentation

## 2011-05-26 DIAGNOSIS — I5020 Unspecified systolic (congestive) heart failure: Secondary | ICD-10-CM | POA: Insufficient documentation

## 2011-05-26 DIAGNOSIS — I10 Essential (primary) hypertension: Secondary | ICD-10-CM | POA: Insufficient documentation

## 2011-05-26 DIAGNOSIS — G4733 Obstructive sleep apnea (adult) (pediatric): Secondary | ICD-10-CM | POA: Insufficient documentation

## 2011-05-26 DIAGNOSIS — E119 Type 2 diabetes mellitus without complications: Secondary | ICD-10-CM | POA: Insufficient documentation

## 2011-05-27 ENCOUNTER — Encounter (HOSPITAL_BASED_OUTPATIENT_CLINIC_OR_DEPARTMENT_OTHER): Payer: Medicare Other

## 2011-05-30 ENCOUNTER — Ambulatory Visit (INDEPENDENT_AMBULATORY_CARE_PROVIDER_SITE_OTHER): Payer: Medicare Other

## 2011-05-30 ENCOUNTER — Encounter (INDEPENDENT_AMBULATORY_CARE_PROVIDER_SITE_OTHER): Payer: Medicare Other

## 2011-05-30 DIAGNOSIS — I70229 Atherosclerosis of native arteries of extremities with rest pain, unspecified extremity: Secondary | ICD-10-CM

## 2011-05-30 DIAGNOSIS — Z48812 Encounter for surgical aftercare following surgery on the circulatory system: Secondary | ICD-10-CM

## 2011-05-30 DIAGNOSIS — I7092 Chronic total occlusion of artery of the extremities: Secondary | ICD-10-CM

## 2011-05-30 NOTE — Discharge Summary (Signed)
  NAMEMYLA, MAURIELLO                 ACCOUNT NO.:  [id number]  MEDICAL RECORD NO.:  [id number]  LOCATION:  2022                         FACILITY:  [facility]  PHYSICIAN:  Veverly Fells. Excell Seltzer, MD  DATE OF BIRTH:  1951-05-17  DATE OF ADMISSION:  05/02/2011 DATE OF DISCHARGE:  05/14/2011                              DISCHARGE SUMMARY   ADDENDUM  Ms. Wethington was held overnight on overnight oximetry.  Her O2 saturation dropped to 85% while she was asleep.  Oxygen was applied and her sats improved to 96-98%.  The next day, she was evaluated by Dr. Excell Seltzer who recommended adding daily Lasix to her medication regimen.  She is not on an ACE inhibitor because of her acute renal insufficiency.  She is to follow up in [place].  She was also seen by Dr. Sherene Sires, and it was recommended that she get home oxygen nightly.  She may also qualify for continuous oxygen as well.  She is currently considered stable for discharge on May 14, 2011.  DISCHARGE INSTRUCTIONS: 1. Her activity level is to be increased gradually. 2. She is encouraged to stick to a low-sodium diabetic diet. 3. She is to call for problems with her cath site. 4. She is to follow up with Dr. Myrtis Ser in [place].  She is to follow up     with Dr. Sherryll Burger as needed.  She is to follow up with Dr. Darrick Penna as     scheduled.  She is to follow up with Dr. Craige Cotta on June 13, 2011, at     2 p.m., and she is to have a sleep study on May 27, 2011, at 8:00     p.m.  She is to weigh herself daily and follow other discharge     instructions on the heart failure discharge instruction sheet.  DISCHARGE MEDICATIONS: 1. Tylenol Extra Strength 1-2 tablets q.6 h. p.r.n. 2. Lopressor 25 mg b.i.d. 3. Amlodipine 5 mg a day. 4. Crestor 40 mg a day. 5. Humulin R 13 units nightly as prior to admission. 6. Lantus 20-45 units b.i.d. as prior to admission. 7. Lasix 40 mg a day. 8. Sublingual nitroglycerin p.r.n. 9. Ibuprofen is discontinued. 10.Aspirin 81 mg a day. 11.Plavix 75  mg a day. 12.Protonix 40 mg a day. 13.Combivent 2 puffs q.3 h. p.r.n. 14.O2 at 2 liters nightly.     Theodore Demark, PA-C   ______________________________ Veverly Fells. Excell Seltzer, MD    RB/MEDQ  D:  05/14/2011  T:  05/15/2011  Job:  [id number]  cc:   Kirstie Peri, MD  Electronically Signed by Theodore Demark PA-C on 05/28/2011 04:11:35 PM Electronically Signed by Tonny Bollman MD on 05/30/2011 12:38:10 AM

## 2011-05-30 NOTE — Assessment & Plan Note (Addendum)
OFFICE VISIT  [name], [name] DOB:  03-28-1951                                       05/30/2011 EAVWU#:[id number]  HISTORY OF PRESENT ILLNESS:  The patient presents today for continued followup of bilateral lower extremity peripheral arterial disease with duplex.  The patient was last evaluated in the office on April 29, 2011 by Dr. Hart Rochester secondary to complaints of a new ulceration at the base of her right first toe.  The ulceration had been present for approximately 2 weeks.  There was no evidence of infection and there is no bone exposed.  It was described as a punctate lesion measuring about 1 cm in diameter at the base the right first toe on the plantar surface.  Dr. Hart Rochester instructed the patient to follow up in approximately 1 week with lower extremity arterial Dopplers to be performed at that time.  The patient did not show for her appointment secondary to the fact she had been admitted to hospital for cardiac issues and is status post coronary artery stenting.  The patient presents today for routine followup.  She states that the wound on her foot is healed but the pain in her bilateral lower extremities is unchanged.  This pain has been present for a prolong period of time.  There is no aggravating or relieving factors.  The pain is present "all the time."  The pain is not worse with walking and it is described as a burning sensation in the feet.  She states that the pain "jumps" from the last 3 toes on the right foot to the great toe and then back.  She also states that this same burning pain is present in the left great toe.  Dangling her feet does not help and neither does propping them up.  She denies edema in the bilateral lower extremities and any additional skin breakdown or wounds.  PAST MEDICAL HISTORY: 1. Peripheral arterial disease status post left popliteal and tibial     embolectomy, left popliteal endarterectomy and bovine  pericardial     patch angioplasty of left popliteal artery. 2. Diabetes mellitus. 3. Hypertension. 4. Coronary artery disease status post stenting. 5. Hyperlipidemia.  MEDICATIONS: 1. Plavix 75 mg p.o. daily. 2. Amlodipine 5 mg daily 3. Pantoprazole 40 mg daily. 4. Crestor 40 mg daily. 5. Aspirin 81 mg day. 6. Metoprolol 25 mg b.i.d.  SOCIAL HISTORY:  The patient is widowed with 3 children.  She is retired.  She does not drink alcohol.  The patient quit smoking in January 2012.  PHYSICAL EXAM:  Vital signs:  Blood pressure is 142/72, heart rate 99, O2 sat 99% on room air.  General:  She is well-nourished, in no acute distress.  HEENT:  PERRLA, EOMI.  Lungs:  Clear to auscultation. Cardiac:  Regular rate and rhythm.  Musculoskeletal:  No major deformities or cyanosis are noted.  There are no wounds noted.  There is a healed callus-type area over the base of the right great toe on the plantar surface.  There is no evidence of cellulitis or other skin breakdown present.  There are  2+ bilateral femoral pulses.  No popliteal or pedal pulses are palpable.  Neurologic:  Nonfocal.  No weakness or paresthesias.  Skin:  No ulcers or rashes.  IMAGING:  Lower extremity arterial duplex was performed on May 30, 2011.  This  revealed ABIs of 0.53 on the right and 0.67 on the left. Previous ABIs performed in February 2012 revealed 0.47 on the right and 0.57 on the left.  ABIs are stable.  There are biphasic waveforms noted throughout the left fem-pop arterial system with an increased velocity noted in the left proximal SFA.  ASSESSMENT/PLAN:  The patient is stable from a vascular standpoint at this time as her pain is unchanged and likely related to her diabetes versus avascular status.  Her ABIs are stable.  The wound on her right toe has healed.  The patient will follow up with Dr. Darrick Penna in 6 months with a repeat lower extremity arterial duplex.  She knows to call us in the interim with  any questions, issues or problems or if a new wound arises.  Pecola Leisure, PA  Charles E. Fields, MD Electronically Signed  AY/MEDQ  D:  05/30/2011  T:  05/30/2011  Job:  [id number]

## 2011-05-30 NOTE — Discharge Summary (Signed)
[name], [name]                 ACCOUNT NO.:  [id number]  MEDICAL RECORD NO.:  [id number]  LOCATION:  2022                         FACILITY:  [facility]  PHYSICIAN:  Veverly Fells. Excell Seltzer, MD  DATE OF BIRTH:  Jan 16, 1951  DATE OF ADMISSION:  05/02/2011 DATE OF DISCHARGE:  05/14/2011                              DISCHARGE SUMMARY   PROCEDURES: 1. Cardiac catheterization. 2. Coronary arteriogram. 3. Left ventriculogram. 4. PTCA and 3.0 x 24-mm drug-eluting stent to the RCA. 5. Staged recatheterization with PTCA and a 3.0-mm drug-eluting stent     to the circumflex as well as 2.25 x 28-mm Promus drug-eluting stent     to the diagonal branch with pressure wire to the LAD demonstrating     an FFR of 0.84. 6. A 2-D echocardiogram. 7. Portable chest x-rays. 8. BiPAP.  PRIMARY FINAL DISCHARGE DIAGNOSIS:  Non-ST-segment elevation myocardial infarction.  SECONDARY DIAGNOSES: 1. Ischemic cardiomyopathy with an ejection fraction of 45%-50% and     grade 2 diastolic dysfunction by echocardiogram this admission,     ejection fraction 40% at catheterization. 2. Morbid obesity with a body mass index of 41. 3. Diabetes. 4. Hypertension. 5. Dyslipidemia. 6. Tobacco use. 7. History of peripheral arterial disease with left superficial     femoral artery stent and emergent left popliteal/tibial embolectomy     and left popliteal endarterectomy with bovine patch angioplasty. 8. Peripheral neuropathy. 9. History of vasovagal syncope. 10.Status post cholecystectomy, appendectomy, tubal ligation, and left     breast lumpectomy followed by radiation therapy for breast cancer     in 2002.  HOSPITAL COURSE:  [name] is a 60 year old female with no previous history of coronary artery disease.  She was diagnosed with peripheral vascular disease several years ago and was scheduled to see Dr. Darrick Penna on the day of admission.  She began having chest pain and exertional dyspnea and went to [facility].  There, she was evaluated by Cardiology and had elevated cardiac enzymes.  Transfer to [facility] for catheterization was recommended and done on May 02, 2011.  The cardiac catheterization showed a 90% diagonal, a 90% circumflex, and an 80 with 95% RCA.  The RCA lesion was the most severe and was treated with PTCA and a drug-eluting stent.  The stent covered both lesions. She tolerated the procedure well.  A TCTS consult was called, but Dr. Laneta Simmers felt that multivessel percutaneous intervention was the best option for her as she was a poor surgical candidate.  Postprocedure, she had a temperature of 101.3.  She remained dyspneic. She was found to have right upper lobe pneumonia and was started on antibiotics.  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  She was seen by the diabetes coordinators who noted that her A1c in January was 12.2 and they recommended compliance with a diabetic diet as well as follow up with primary care and some medication adjustments.  Her TSH was low, so followup T3 and T4 were done.  The free T3 and T4 were within normal limits.  Her TSH was slightly low at 0.252.  She is  to follow up with primary care.  Because of the need for prolonged antiplatelet, PTY12 test was performed.  It showed effective inhibition at 37%, so she is continued on Plavix and is to follow up as an outpatient.  Pulmonary consult was called to assist with antibiotic therapy and management of her respiratory failure.  She was noted to be hypoxic and hypercarbic. She required BiPAP.  There was concern for obstructive sleep apnea as well as underlying COPD.  Her respiratory status was maximized and by discharge, her O2 saturations were in the upper 90s on room air.  She was found to have the wound on her great toe on the right.  She was seen by Wound Care and Bactroban was recommended to promote moist healing with no  further workup indicated.  She was seen by Physical Therapy and Occupational Therapy for deconditioning.  She is to receive PT and OT as an outpatient.  On May 08, 2011, she was stable for recatheterization with percutaneous intervention and she was done.  She tolerated this well.  After her respiratory status improved and she no longer required BiPAP, CPAP was attempted.  However, the patient had problems with it, stating that it made her anxious.  She was seen by Cardiac Rehab and compliance with medications as well as daily weights and a diabetic diet were done.  Her BUN and creatinine trended up, so the Lasix was held, but at discharge, she had only minimal renal insufficiency with a BUN of 22 and creatinine 1.21, GFR 45.  She completed antibiotics for her upper respiratory infection and will follow up with Pulmonary as an outpatient.  She was seen by Smoking Cessation and had quit tobacco in January 2012.  Ongoing compliance with smoking cessation was encouraged.  Because of her heart failure, she was started on a beta-blocker, but because of her renal insufficiency, she was not on an ACE inhibitor.  By discharge, herweight was down to 100.2 kg which is at least 5 kg down from her admission weight.  Dr. Sherene Sires recommended trying nasal O2 as she was not likely to be compliant with BiPAP which was being recommended as the patient did not tolerate CPAP.  Overnight oximetry to see if she qualifies for nighttime O2 is pending.  Physical Therapy is working with [name] with her deconditioning and she is tentatively considered stable for discharge on May 14, 2011.  Discharge instructions will be finalized on the day of discharge, and reports 5-pound gain.     Theodore Demark, PA-C   ______________________________ Veverly Fells. Excell Seltzer, MD    RB/MEDQ  D:  05/13/2011  T:  05/14/2011  Job:  [id number]  Electronically Signed by Theodore Demark PA-C on 05/28/2011 04:11:13 PM Electronically  Signed by Tonny Bollman MD on 05/30/2011 12:38:04 AM

## 2011-05-30 NOTE — Cardiovascular Report (Signed)
NAMEMELANE, WINDHOLZ                 ACCOUNT NO.:  [id number]  MEDICAL RECORD NO.:  [id number]  LOCATION:  2909                         FACILITY:  [facility]  PHYSICIAN:  Veverly Fells. Excell Seltzer, MD  DATE OF BIRTH:  [birth year]  DATE OF PROCEDURE:  05/02/2011 DATE OF DISCHARGE:                           CARDIAC CATHETERIZATION   PROCEDURES: 1. Left heart catheterization. 2. Selective coronary angiography. 3. Left ventricular angiography. 4. Percutaneous transluminal coronary angioplasty and stenting of the     right coronary artery.  PROCEDURAL INDICATIONS:  Ms. Wisby is a complex 60 year old insulin- dependent diabetic.  She has known CAD with moderate diffuse distal vessel disease from catheterization in 2006.  She also has severe peripheral arterial disease with multiple vascular interventions performed by Dr. Darrick Penna.  She actually has a nonhealing ulcer on her foot at present and is being considered for upcoming fem-pop bypass. She presented to [facility] with heart failure and acute coronary syndrome.  Her troponin was elevated and she ruled in for non-ST- elevation infarction.  She was brought down emergently for cardiac cath in the setting of ongoing chest tightness.  Risks and indications of procedure were reviewed with the patient. Informed consent was obtained.  The right wrist was prepped, draped and anesthetized with 1% lidocaine using modified Seldinger technique.  A 5- French sheath was placed in the right radial artery, 3 mg of verapamil was administered through the sheath, 4000 units of unfractionated heparin was given.  Standard Judkins catheters were used for coronary angiography and left ventriculography.  The patient was found to have a critical stenosis of the right coronary artery which is clearly her culprit lesion.  She also had high-grade focal disease in the left circumflex, diffuse disease in the first diagonal, and moderately tight stenosis in the mid  LAD.  I discussed her case with Dr. Darrick Penna over the telephone in light of her upcoming need for peripheral vascular surgery.  I also consulted Dr. Laneta Simmers who came to the cath lab and reviewed her films.  Dr. Laneta Simmers and I felt the best way to proceed was with PCI.  This patient is not a very good surgical candidate as she is immobile and in a wheelchair because of her severe leg pain and problems related to her vascular disease.  She has fairly focal disease in the right coronary artery and left circumflex that can be treated percutaneously and we can consider PCI in the LAD/diagonal territory as needed.  She also has an active congestive heart failure. This was all reviewed with the patient who understood the rationale for proceeding with PCI.  The patient was loaded with 600 mg of Plavix.  Plavix was utilized because she can probably undergo fem-pop bypass on this agent with a lower bleeding risk than one of the new her antiplatelet drugs. Integrilin was started as well.  She will continue on an 18-hour infusion.  Sheath in the radial artery was upsized to a 6-French.  A JR- 4 guide catheter was utilized.  A cougar wire was passed easily beyond the lesion.  An additional 4000 units of heparin had been given and the ACT was between 200 and 250 seconds.  The vessel was predilated with a 2.5 x 15-mm balloon and then stented with a 3.0 x 24-mm Promus drug- eluting stent.  There were actually tandem lesions in the right coronary artery and the 24 mm stent covered both lesions.  The stent was deployed at 16 atmospheres.  It was postdilated with a 3.25 x 15-mm [place] Quantum Apex which was taken to 16 atmospheres on 2 inflations.  There was an excellent result with TIMI 3 flow and 0% residual stenosis.  The patient tolerated the procedure well and will be transferred to the CCU.  PROCEDURAL FINDINGS:  Aortic pressure 123/69 with a mean of 91, left ventricular pressure 121/31.  Right coronary  artery.  There is an 80% stenosis in the mid vessel followed by a 95-99% stenosis with TIMI 2 flow in the distal vessel.  Left main coronary artery is patent.  It divides into the LAD and left circumflex.  LAD.  The LAD has diffuse nonobstructive disease.  There is a focal mid LAD plaque that is in the range of 50-70% stenosis.  The first diagonal has severe sequential stenoses of 80-90%.  It is a moderate caliber vessel.  Left circumflex.  The left circumflex has an 80-90% focal proximal to mid stenosis.  The vessel gives off a single OM that divides into 2 sub branches.  The AV groove circumflex is small.  There is no other high- grade stenosis within the left circumflex distribution.  There is a small intermediate branch that has diffuse disease as well.  Left ventriculography shows a large area of inferior akinesis.  The left ventricular ejection fraction is 40%.  ASSESSMENT: 1. Critical right coronary artery stenosis with successful     percutaneous coronary intervention using a single drug-eluting     stent. 2. Severe left circumflex and diagonal stenoses. 3. Moderate mid left anterior descending stenosis. 4. Moderate segmental left ventricular dysfunction with markedly     elevated left ventricular end-diastolic pressure.  RECOMMENDATIONS:  The patient will continue on aspirin and Plavix. Integrilin will be continued for 18 hours.  We will treat her congestive heart failure with IV diuretics and nitrates.  We will consider staged PCI of the left circumflex and possibly the diagonal branch on Monday if her CHF improves by then, may need to consider pressure wire analysis of the LAD.     Veverly Fells. Excell Seltzer, MD     MDC/MEDQ  D:  05/02/2011  T:  05/03/2011  Job:  [id number]  Electronically Signed by Tonny Bollman MD on 05/30/2011 12:37:51 AM

## 2011-05-30 NOTE — Cardiovascular Report (Signed)
[name], [name]                 ACCOUNT NO.:  [id number]  MEDICAL RECORD NO.:  [id number]  LOCATION:                                 FACILITY:  PHYSICIAN:  [name]. Excell Seltzer, MD  DATE OF BIRTH:  May 01, 1951  DATE OF PROCEDURE:  05/08/2011 DATE OF DISCHARGE:                           CARDIAC CATHETERIZATION   PROCEDURE: 1. PTCA and stenting of left circumflex. 2. PTCA and stenting of the first diagonal. 3. Pressure wire analysis of the LAD.  PROCEDURAL INDICATIONS:  Ms. [name] is a 60 year old woman with multiple comorbidities who presented with a non-ST-elevation infarction last week.  She had critical stenosis in the right coronary artery and was treated with PCI using a drug-eluting stent.  She has multivessel disease, and I discussed her case with Dr. Laneta Simmers who came to the cath lab and reviewed her films.  We decided that multivessel PCI would be the preferred treatment considering her severe peripheral arterial disease and limited mobility.  The patient was brought back today for treatment of severe stenosis in the proximal left circumflex and first diagonal as well as pressure wire analysis of a moderately tight lesion in the mid LAD.  Risks and indications of procedure were reviewed with the patient. Informed consent was obtained.  The right wrist was prepped, draped and anesthetized with 1% lidocaine.  Using the modified Seldinger technique, a 6-French sheath was placed in the right radial artery.  Bivalirudin was used for anticoagulation.  The patient had been adequately preloaded with aspirin and Plavix.  Verapamil was administered through the radial sheath.  An XBLAD 3.5-cm guide catheter was inserted.  A cougar wire was advanced across the tight 90% eccentric proximal circumflex lesion, and the vessel was then predilated with a 2.0 x 12 mm apex balloon, which was taken to 10 atmospheres.  The vessel was then stented with a 3.0 x 16 mm Promus drug-eluting stent.   The stent was carefully positioned and deployed at 14 atmospheres.  The stent was then postdilated with a 3.25 x 12 mm noncompliant balloon.  The stent was postdilated and appeared well expanded.  There was TIMI 3 flow and 0% residual stenosis. Attention was then turned to the diagonal branch.  The diagonal was wired with the same cougar wire.  The vessel had severe 90-95% sequential stenoses and was diffusely diseased.  It was predilated with the same 2.0 x 12 mm balloon for two separate inflations.  The vessel was then stented with a 2.25 x 28 mm Promus drug-eluting stent.  The stent was deployed at 10 atmospheres and was then postdilated with a 2.25 x 20 mm [place] Quantum apex balloon, which was taken to 14 atmospheres on two inflations.  There was an excellent result with a good step-down off the distal edge of the stent.  The stent went back near the ostium of the diagonal, but did not extend into the LAD.  Attention was then turned to the LAD.  A pressure wire was advanced across the lesion in the mid LAD, and intravenous adenosine was started. The FFR value at peak hyperemia was 0.84, and this was therefore felt to be hemodynamically insignificant.  The  wire was removed and final angiography was performed demonstrating stability of the lesion in the mid LAD.  The patient tolerated the entire procedure well.  She was transferred to the post-cath recovery area in stable condition.  FINAL CONCLUSIONS: 1. A successful two-vessel PCI of the left circumflex and first     diagonal branch of the LAD using drug-eluting stent platforms. 2. Negative pressure wire analysis of the mid LAD.  RECOMMENDATIONS:  The patient will continue with aggressive medical therapy for secondary prevention.  She should remain on dual antiplatelet therapy with aspirin and Plavix for a minimum of 12 months.     [name]. Excell Seltzer, MD     MDC/MEDQ  D:  05/08/2011  T:  05/09/2011  Job:   [id number]  Electronically Signed by Tonny Bollman MD on 05/30/2011 12:37:57 AM

## 2011-05-31 NOTE — Telephone Encounter (Signed)
Noted  

## 2011-06-03 NOTE — Procedures (Addendum)
NAMEHARLENE, PETRALIA                 ACCOUNT NO.:  [id number]  MEDICAL RECORD NO.:  [id number]          PATIENT TYPE:  OUT  LOCATION:  SLEEP CENTER                 FACILITY:  [facility]  PHYSICIAN:  Coralyn Helling, MD        DATE OF BIRTH:  1951-07-15  DATE OF STUDY:  05/26/2011                           NOCTURNAL POLYSOMNOGRAM  REFERRING PHYSICIAN:  Coralyn Helling, MD  INDICATION FOR STUDY:  Ms. Pevehouse is a 60 year old female who has a history of coronary artery disease, systolic congestive heart failure, diabetes, and hypertension.  She had recent hospitalization and there was a concern that she could have sleep disordered breathing.  As a result, she is scheduled for a sleep study to further evaluate hypersomnia with obstructive sleep apnea.  Height is 5 feet 3 inches, weight is 215 pounds, BMI is 38, neck size 18 inches.  EPWORTH SLEEPINESS SCORE:  12.  MEDICATIONS:  Aspirin, furosemide, amlodipine, Crestor, Humulin, and Lantus.  SLEEP ARCHITECTURE:  Total recording time is 377 minutes.  Total sleep time is 337 minutes.  Sleep efficiency is 89%.  Sleep latency is 12 minutes.  REM latency is 151 minutes.  The patient was observed in all stages of sleep and slept in both the supine and non-supine positions.  RESPIRATORY DATA:  The average respiratory rate was 21.  Loud snoring was noted by technician.  The overall apnea/hypopnea index was 10.3. The events were exclusively obstructive in nature.  The supine apnea/hypopnea index was 21.7.  The non-supine apnea/hypopnea index was zero.  OXYGEN DATA:  The baseline oxygenation was 94%.  The oxygen saturation nadir was 70%.  The study was conducted without the use of supplemental oxygen.  CARDIAC DATA:  The average heart rate was 62 and the rhythm strip showed sinus rhythm.  MOVEMENT-PARASOMNIA:  The patient had no restroom trips and the periodic limb movement index was zero.  IMPRESSIONS-RECOMMENDATIONS:  This study shows evidence for  mild obstructive sleep apnea with an overall apnea/hypopnea index of 10.3 and oxygen saturation nadir of 70%.  Of note is that she had a significant positional effect to her sleep disordered breathing, particularly during rapid eye movement sleep.  In addition, this study was conducted without the use of supplemental oxygen.  In addition to diet, exercise, and weight reduction, the patient could be tried on positional therapy.  If this is unsuccessful, then additional therapeutic options could include CPAP therapy, oral appliance, or surgical intervention.     Coralyn Helling, MD Diplomat, American Board of Sleep Medicine Electronically Signed    VS/MEDQ  D:  06/02/2011 14:54:20  T:  06/03/2011 03:59:15  Job:  [id number]

## 2011-06-03 NOTE — Procedures (Unsigned)
LOWER EXTREMITY ARTERIAL DUPLEX  INDICATION:  Left lower extremity stents.  HISTORY: Diabetes:  Yes. Cardiac:  No. Hypertension:  Yes. Smoking:  Yes. Previous Surgery:  Left superficial femoral PTA/stenting, left popliteal and tibial peroneal trunk endarterectomy and PTA on 11/30/2010.  SINGLE LEVEL ARTERIAL EXAM                         RIGHT                LEFT Brachial:               146                  146 Anterior tibial:        77                   98 Posterior tibial:       70                   88 Peroneal: Ankle/Brachial Index:   0.53                 0.67  LOWER EXTREMITY ARTERIAL DUPLEX EXAM  DUPLEX:  Biphasic Doppler waveforms noted throughout the left fem-pop arterial system with a velocity of 424 cm/s noted in the left proximal superficial femoral artery.  IMPRESSION: 1. Patent left superficial femoral, popliteal and posterior tibial     trunk revascularization sites with increased velocity noted, as     described above. 2. The bilateral ankle brachial indices and Doppler waveforms suggest     moderately decreased perfusion of the bilateral lower extremities.     The bilateral ankle brachial indices appear stable when compared to     the previous exam on 12/13/2010.  ___________________________________________ [name]. Fields, MD  CH/MEDQ  D:  06/03/2011  T:  06/03/2011  Job:  [id number]

## 2011-06-04 ENCOUNTER — Telehealth: Payer: Self-pay | Admitting: Pulmonary Disease

## 2011-06-04 DIAGNOSIS — G4733 Obstructive sleep apnea (adult) (pediatric): Secondary | ICD-10-CM | POA: Insufficient documentation

## 2011-06-04 NOTE — Telephone Encounter (Signed)
PSG 05/26/11>>AHI 10.3, SpO2 low 70%.  Positional effect.  Will have my nurse schedule ROV to discuss.

## 2011-06-11 NOTE — Telephone Encounter (Signed)
[id number] x1 

## 2011-06-11 NOTE — Telephone Encounter (Signed)
Spoke with pt notified needs ov with VS to dicuss PSG- pt states already has appt with VS on 06/13/11 and so I advised that she keep this appt. Pt verbalized understanding.

## 2011-06-13 ENCOUNTER — Ambulatory Visit (INDEPENDENT_AMBULATORY_CARE_PROVIDER_SITE_OTHER): Payer: Medicare Other | Admitting: Pulmonary Disease

## 2011-06-13 ENCOUNTER — Encounter: Payer: Self-pay | Admitting: Pulmonary Disease

## 2011-06-13 DIAGNOSIS — G4733 Obstructive sleep apnea (adult) (pediatric): Secondary | ICD-10-CM

## 2011-06-13 DIAGNOSIS — I2510 Atherosclerotic heart disease of native coronary artery without angina pectoris: Secondary | ICD-10-CM

## 2011-06-13 DIAGNOSIS — J4489 Other specified chronic obstructive pulmonary disease: Secondary | ICD-10-CM | POA: Insufficient documentation

## 2011-06-13 DIAGNOSIS — J449 Chronic obstructive pulmonary disease, unspecified: Secondary | ICD-10-CM

## 2011-06-13 NOTE — Patient Instructions (Signed)
Will schedule sleep test (CPAP titration) Will arrange for breathing test (PFT) Will call to schedule follow up after sleep test reviewed

## 2011-06-13 NOTE — Progress Notes (Signed)
Subjective:    Patient ID: [name], female    DOB: 06-02-1951, 60 y.o.   MRN: [id number]  HPI CC: [name]  60 yo female former smoker with OSA and probable COPD.  She is here for hospital follow up and to review PSG from 05/26/11>>AHI 10.3, SpO2 low 70%. Positional effect.  Her breathing is doing better.  She has used combivent about 4 times since getting out of the hospital.  This helps.  She gets occasional cough with clear sputum and wheeze.  She denies chest pain or hemoptysis.  Her sinuses are doing okay.  She has been using 2 liters oxygen at night since she got out of the hospital.   Review of Systems     Objective:   Physical Exam BP 128/62  Pulse 93  Temp(Src) 98.8 F (37.1 C) (Oral)  Ht 5\' 3"  (1.6 m)  Wt 218 lb (98.884 kg)  BMI 38.62 kg/m2  SpO2 97%  HEENT - no sinus tenderness, no oral exudate, no LAN Cardiac - s1s2 regular, no murmur Chest - no wheeze/rales Abd - soft, nontender Ext - no edema Neuro - normal strength, CN intact Psych - normal mood/behavior      Assessment & Plan:   OSA (obstructive sleep apnea) She has mild sleep apnea.  I have reviewed his sleep test results with the patient.  Explained how sleep apnea can affect the patient's health.  Driving precautions and importance of weight loss were discussed.  Treatment options for sleep apnea were reviewed.  Will proceed with CPAP titration study and then determine if she also needs to continue with supplemental oxygen at night.  Chronic bronchitis with COPD (chronic obstructive pulmonary disease) She has prior history of smoking.  She has symptomatic benefit from prn combivent.  Will get PFTs to further assess.    Updated Medication List Outpatient Encounter Prescriptions as of 06/13/2011  Medication Sig Dispense Refill  . acetaminophen (TYLENOL) 500 MG tablet Take 500 mg by mouth every 6 (six) hours as needed.        Marland Kitchen albuterol-ipratropium (COMBIVENT) 18-103 MCG/ACT inhaler  Inhale 2 puffs into the lungs every 6 (six) hours as needed.        Marland Kitchen amLODipine (NORVASC) 5 MG tablet Once a day      . aspirin 81 MG tablet Take 81 mg by mouth daily.        . clopidogrel (PLAVIX) 75 MG tablet Take 75 mg by mouth daily.        . furosemide (LASIX) 40 MG tablet Take 40 mg by mouth daily.        . insulin glargine (LANTUS) 100 UNIT/ML injection Inject 48 Units into the skin at bedtime.       . Insulin Zinc Human (HUMULIN L [address]) Inject into the skin.        . metoprolol tartrate (LOPRESSOR) 25 MG tablet Take 25 mg by mouth 2 (two) times daily.        . pantoprazole (PROTONIX) 40 MG tablet Take 40 mg by mouth daily.        . rosuvastatin (CRESTOR) 40 MG tablet Take 40 mg by mouth daily.        Marland Kitchen DISCONTD: atenolol (TENORMIN) 50 MG tablet Take 50 mg by mouth daily.        Marland Kitchen DISCONTD: buPROPion (WELLBUTRIN) 100 MG tablet Take 100 mg by mouth 2 (two) times daily.        Marland Kitchen DISCONTD: glimepiride (AMARYL) 2 MG  tablet Take 2 mg by mouth daily before breakfast.        . DISCONTD: hydrochlorothiazide 25 MG tablet Take 25 mg by mouth daily.        Marland Kitchen DISCONTD: lisinopril (PRINIVIL,ZESTRIL) 20 MG tablet Take 20 mg by mouth 2 (two) times daily.        Marland Kitchen DISCONTD: metFORMIN (GLUCOPHAGE) 500 MG tablet Take 500 mg by mouth 2 (two) times daily with a meal.        . DISCONTD: oxyCODONE-acetaminophen (PERCOCET) 5-325 MG per tablet Take 1 tablet by mouth every 8 (eight) hours as needed.        Marland Kitchen DISCONTD: pregabalin (LYRICA) 75 MG capsule Take 75 mg by mouth 2 (two) times daily.        Marland Kitchen DISCONTD: simvastatin (ZOCOR) 20 MG tablet Take 20 mg by mouth at bedtime.

## 2011-06-13 NOTE — Assessment & Plan Note (Signed)
She has mild sleep apnea.  I have reviewed his sleep test results with the patient.  Explained how sleep apnea can affect the patient's health.  Driving precautions and importance of weight loss were discussed.  Treatment options for sleep apnea were reviewed.  Will proceed with CPAP titration study and then determine if she also needs to continue with supplemental oxygen at night.

## 2011-06-13 NOTE — Assessment & Plan Note (Signed)
She has prior history of smoking.  She has symptomatic benefit from prn combivent.  Will get PFTs to further assess.

## 2011-06-16 ENCOUNTER — Encounter: Payer: Self-pay | Admitting: Cardiology

## 2011-06-16 DIAGNOSIS — I5189 Other ill-defined heart diseases: Secondary | ICD-10-CM | POA: Insufficient documentation

## 2011-06-16 DIAGNOSIS — J449 Chronic obstructive pulmonary disease, unspecified: Secondary | ICD-10-CM | POA: Insufficient documentation

## 2011-06-16 DIAGNOSIS — G629 Polyneuropathy, unspecified: Secondary | ICD-10-CM | POA: Insufficient documentation

## 2011-06-16 DIAGNOSIS — Z720 Tobacco use: Secondary | ICD-10-CM | POA: Insufficient documentation

## 2011-06-16 DIAGNOSIS — K219 Gastro-esophageal reflux disease without esophagitis: Secondary | ICD-10-CM | POA: Insufficient documentation

## 2011-06-16 DIAGNOSIS — E785 Hyperlipidemia, unspecified: Secondary | ICD-10-CM | POA: Insufficient documentation

## 2011-06-16 DIAGNOSIS — R55 Syncope and collapse: Secondary | ICD-10-CM | POA: Insufficient documentation

## 2011-06-16 DIAGNOSIS — E119 Type 2 diabetes mellitus without complications: Secondary | ICD-10-CM | POA: Insufficient documentation

## 2011-06-16 DIAGNOSIS — I2510 Atherosclerotic heart disease of native coronary artery without angina pectoris: Secondary | ICD-10-CM | POA: Insufficient documentation

## 2011-06-16 DIAGNOSIS — C50919 Malignant neoplasm of unspecified site of unspecified female breast: Secondary | ICD-10-CM | POA: Insufficient documentation

## 2011-06-16 DIAGNOSIS — I739 Peripheral vascular disease, unspecified: Secondary | ICD-10-CM | POA: Insufficient documentation

## 2011-06-16 DIAGNOSIS — R9430 Abnormal result of cardiovascular function study, unspecified: Secondary | ICD-10-CM | POA: Insufficient documentation

## 2011-06-16 DIAGNOSIS — I10 Essential (primary) hypertension: Secondary | ICD-10-CM | POA: Insufficient documentation

## 2011-06-16 DIAGNOSIS — I5042 Chronic combined systolic (congestive) and diastolic (congestive) heart failure: Secondary | ICD-10-CM | POA: Insufficient documentation

## 2011-06-17 ENCOUNTER — Encounter (INDEPENDENT_AMBULATORY_CARE_PROVIDER_SITE_OTHER): Payer: Medicare Other

## 2011-06-17 ENCOUNTER — Ambulatory Visit (INDEPENDENT_AMBULATORY_CARE_PROVIDER_SITE_OTHER): Payer: Medicare Other | Admitting: Cardiology

## 2011-06-17 ENCOUNTER — Encounter: Payer: Self-pay | Admitting: Cardiology

## 2011-06-17 DIAGNOSIS — G4733 Obstructive sleep apnea (adult) (pediatric): Secondary | ICD-10-CM

## 2011-06-17 DIAGNOSIS — R0989 Other specified symptoms and signs involving the circulatory and respiratory systems: Secondary | ICD-10-CM

## 2011-06-17 DIAGNOSIS — R0609 Other forms of dyspnea: Secondary | ICD-10-CM

## 2011-06-17 DIAGNOSIS — R9430 Abnormal result of cardiovascular function study, unspecified: Secondary | ICD-10-CM

## 2011-06-17 DIAGNOSIS — J449 Chronic obstructive pulmonary disease, unspecified: Secondary | ICD-10-CM

## 2011-06-17 DIAGNOSIS — I10 Essential (primary) hypertension: Secondary | ICD-10-CM

## 2011-06-17 DIAGNOSIS — I2510 Atherosclerotic heart disease of native coronary artery without angina pectoris: Secondary | ICD-10-CM

## 2011-06-17 DIAGNOSIS — I739 Peripheral vascular disease, unspecified: Secondary | ICD-10-CM

## 2011-06-17 LAB — PULMONARY FUNCTION TEST

## 2011-06-17 MED ORDER — NITROGLYCERIN 0.4 MG SL SUBL: 0.4000 mg | SUBLINGUAL_TABLET | SUBLINGUAL | Status: DC | PRN

## 2011-06-17 NOTE — Assessment & Plan Note (Signed)
Her vascular disease is followed carefully by Dr. Darrick Penna.  I will be sure he gets copies of my notes.

## 2011-06-17 NOTE — Patient Instructions (Signed)
   Nitroglycerin sent to pharmacy  Aspirin 81mg  daily  Plavix 75mg  daily  PLEASE REMEMBER TO TAKE BOTH MEDS ABOVE DAILY Follow up in  6 weeks

## 2011-06-17 NOTE — Assessment & Plan Note (Signed)
Patient has significant coronary disease status post drug-eluting stents to 3 areas.  She has had some vomiting.  Today is the first day she did not take her Plavix yet.  The vomiting is not new.  I have stressed to her at great length the importance of her aspirin and Plavix each day as the absolute first medicines he she tries to be sure that she takes.  I have repeated this several times.  I feel that she is stable at this point.  Overall see her back in 6 weeks for followup.

## 2011-06-17 NOTE — Assessment & Plan Note (Signed)
Patient is scheduled to have pulmonary function studies and sleep study for complete ongoing evaluation by Dr. Craige Cotta.

## 2011-06-17 NOTE — Assessment & Plan Note (Signed)
Systolic blood pressure is mildly elevated today.  She has not taken her meds because of vomiting.  There is no reason to change her medicines today.

## 2011-06-17 NOTE — Progress Notes (Signed)
HPI The patient is seen post hospitalization for followup of coronary disease.  The patient has complex cardiac history.  She was recently hospitalized and underwent cardiac catheterization with a staged procedure.  I have reviewed prior notes by her vascular surgeon.  I have reviewed her hospital data and I reviewed the catheter report.  I have reviewed the discharge summary.  Also taken a careful followup history from the patient concerning other issues.  I spent an extensive amount of time with these reviews.  Have also reviewed the note from Dr. Craige Cotta as he has seen her for pulmonary followup since the hospitalization  The patient has known significant vascular disease.  She recently had a procedure by her vascular surgeon.  At that time she was having significant chest pain.  On the day of admission she had marked chest tightness with shortness of breath.  She underwent a staged procedure with a drug-eluting stent the right coronary artery.  She then had a staged procedure with a drug-eluting stent to the circumflex and a drug-eluting stent to the diagonal.  Her LAD was checked with a pressure wire and her FFR was 0.84.  She is now here for followup.  Patient has problems with vomiting intermittently.  This is a chronic problem and is not new or  related to the current medications. She has had one episode of mild chest discomfort since the hospital.  It was not like her presenting symptoms.  .No Known Allergies  Current Outpatient Prescriptions  Medication Sig Dispense Refill  . acetaminophen (TYLENOL) 500 MG tablet Take 500 mg by mouth every 6 (six) hours as needed.        Marland Kitchen albuterol-ipratropium (COMBIVENT) 18-103 MCG/ACT inhaler Inhale 2 puffs into the lungs every 3 (three) hours as needed.       Marland Kitchen amLODipine (NORVASC) 5 MG tablet Take 5 mg by mouth daily.       Marland Kitchen aspirin 81 MG tablet Take 81 mg by mouth daily.        . clopidogrel (PLAVIX) 75 MG tablet Take 75 mg by mouth daily.        .  furosemide (LASIX) 40 MG tablet Take 40 mg by mouth daily.        . insulin glargine (LANTUS) 100 UNIT/ML injection Inject 48 Units into the skin at bedtime. 24u sq in morning      . insulin regular (HUMULIN R,NOVOLIN R) 100 units/mL injection Inject into the skin as directed. Per sliding scale       . metoprolol tartrate (LOPRESSOR) 25 MG tablet Take 25 mg by mouth 2 (two) times daily.        . pantoprazole (PROTONIX) 40 MG tablet Take 40 mg by mouth daily.        . rosuvastatin (CRESTOR) 40 MG tablet Take 40 mg by mouth daily.          History   Social History  . Marital Status: Widowed    Spouse Name: N/A    Number of Children: N/A  . Years of Education: N/A   Occupational History  . Not on file.   Social History Main Topics  . Smoking status: Former Smoker -- 1.5 packs/day for 44 years    Types: Cigarettes    Quit date: 11/30/2010  . Smokeless tobacco: Never Used  . Alcohol Use: No  . Drug Use: No  . Sexually Active: Not on file   Other Topics Concern  . Not on file   Social  History Narrative  . No narrative on file    Family History  Problem Relation Age of Onset  . Hypertension Sister   . Hyperlipidemia Sister   . Hyperlipidemia Brother   . Lung cancer Father   . Stomach cancer Sister     Past Medical History  Diagnosis Date  . GERD (gastroesophageal reflux disease)   . PAD (peripheral artery disease)     Stent, embolectomy, surgery  /     Dr. Darrick Penna, ABI 0.52 right, 0.54 left, February, 2010  . COPD (chronic obstructive pulmonary disease)     Dr. Craige Cotta, medications, PFTs August, 2012  . OSA (obstructive sleep apnea)     Dr. Craige Cotta  CPAP started August, 2012  . Systolic and diastolic CHF, chronic   . CAD (coronary artery disease)     June, 2012, DES to RCA, staged DES to circumflex and DES to diagonal with pressure wire to LAD, FFR 0.84  . DM (diabetes mellitus), type 2   . Hypertension   . Morbid obesity   . Hyperlipidemia   . Ejection fraction < 50%       EF 45-50%, echo, June, 2012  . Diastolic dysfunction     grade 2, echo, June, 2012  . Peripheral neuropathy   . Tobacco abuse   . Syncope     vasovagal  . Breast cancer     lumpectomy and radiation 202    Past Surgical History  Procedure Date  . Appendectomy   . Cholecystectomy   . Tubal ligation   . Breast surgery     Left breast cancer  . Cardiac catheterization 2002  . Popliteal artery angioplasty 11/30/10    Left leg w/ embolectomy, endarterectomy  by Dr.Charles Fields    ROS  Patient denies fever, chills, headache, sweats, rash, change in vision, change in hearing, cough, urinary symptoms.  All of the systems are reviewed and are negative other than the history of present illness.  PHYSICAL EXAM Patient is oriented to person time and place.  Affect is normal.  Head is atraumatic.  Lungs are clear.  Respiratory effort is nonlabored.  There is no jugulovenous distention.  Cardiac exam reveals an S1-S2.  No clicks or significant murmurs.  The abdomen is soft.  There is no peripheral edema.  There are no musculoskeletal deformities.  No skin rashes. Filed Vitals:   06/17/11 0843  BP: 155/80  Pulse: 100  Height: 5\' 3"  (1.6 m)  Weight: 213 lb (96.616 kg)  SpO2: 97%    EKG Is not done today.  ASSESSMENT & PLAN

## 2011-06-17 NOTE — Assessment & Plan Note (Signed)
The patient had mild left ventricular dysfunction at the time of catheter.  She is on appropriate medications.  No change today.  She's not having any clinical signs of heart failure at this time.

## 2011-06-19 ENCOUNTER — Encounter: Payer: Self-pay | Admitting: Pulmonary Disease

## 2011-06-20 ENCOUNTER — Encounter: Payer: Self-pay | Admitting: Pulmonary Disease

## 2011-06-20 ENCOUNTER — Ambulatory Visit: Payer: Medicare Other | Admitting: Vascular Surgery

## 2011-06-21 ENCOUNTER — Ambulatory Visit (HOSPITAL_BASED_OUTPATIENT_CLINIC_OR_DEPARTMENT_OTHER): Payer: Medicare Other | Attending: Pulmonary Disease

## 2011-06-21 DIAGNOSIS — G4733 Obstructive sleep apnea (adult) (pediatric): Secondary | ICD-10-CM | POA: Insufficient documentation

## 2011-06-21 DIAGNOSIS — Z794 Long term (current) use of insulin: Secondary | ICD-10-CM | POA: Insufficient documentation

## 2011-06-21 DIAGNOSIS — I5020 Unspecified systolic (congestive) heart failure: Secondary | ICD-10-CM | POA: Insufficient documentation

## 2011-06-21 DIAGNOSIS — Z79899 Other long term (current) drug therapy: Secondary | ICD-10-CM | POA: Insufficient documentation

## 2011-06-21 DIAGNOSIS — Z7982 Long term (current) use of aspirin: Secondary | ICD-10-CM | POA: Insufficient documentation

## 2011-06-21 DIAGNOSIS — I2510 Atherosclerotic heart disease of native coronary artery without angina pectoris: Secondary | ICD-10-CM | POA: Insufficient documentation

## 2011-06-21 DIAGNOSIS — I10 Essential (primary) hypertension: Secondary | ICD-10-CM | POA: Insufficient documentation

## 2011-06-21 DIAGNOSIS — Z7902 Long term (current) use of antithrombotics/antiplatelets: Secondary | ICD-10-CM | POA: Insufficient documentation

## 2011-06-21 DIAGNOSIS — E119 Type 2 diabetes mellitus without complications: Secondary | ICD-10-CM | POA: Insufficient documentation

## 2011-06-21 DIAGNOSIS — I509 Heart failure, unspecified: Secondary | ICD-10-CM | POA: Insufficient documentation

## 2011-07-02 DIAGNOSIS — G4733 Obstructive sleep apnea (adult) (pediatric): Secondary | ICD-10-CM

## 2011-07-02 DIAGNOSIS — E662 Morbid (severe) obesity with alveolar hypoventilation: Secondary | ICD-10-CM

## 2011-07-02 NOTE — Procedures (Addendum)
[name], [name]                 ACCOUNT NO.:  [id number]  MEDICAL RECORD NO.:  [id number]          PATIENT TYPE:  OUT  LOCATION:  SLEEP CENTER                 FACILITY:  [facility]  PHYSICIAN:  Coralyn Helling, MD        DATE OF BIRTH:  1951-05-17  DATE OF STUDY:  06/21/2011                           NOCTURNAL POLYSOMNOGRAM  REFERRING PHYSICIAN:  Coralyn Helling, MD  INDICATION FOR STUDY:  Ms. Locklin is a 60 year old female who has a history of coronary artery disease, systolic congestive heart failure, diabetes mellitus, and hypertension.  She had a sleep study on May 26, 2011, and was found to have an apnea-hypopnea index of 10.  She was referred to sleep lab for further evaluation of her CPAP titration study.  EPWORTH SLEEPINESS SCORE: 1. Height is 5 feet 3 inches.  Weight is 216 pounds.  BMI is 38.  Neck     size 18 inches.  MEDICATIONS:  Tylenol, Combivent, Norvasc, aspirin, Plavix, Lasix, Lantus, and Humulin.  SLEEP ARCHITECTURE:  Total recording time was 432 minutes.  Total sleep time was 317 minutes.  Sleep efficiency was 73%.  Sleep latency was 33 minutes.  REM latency 356 minutes.  The patient slept in the supine position, and she was there in all stages of sleep.  RESPIRATORY DATA:  The average respiratory rate was 22.  The patient was started on CPAP of 5 cm of water and increased to 8 cm water.  Her apnea- hypopnea index was reduced to 0 with 8 cm water pressure, and she was observed in REM sleep.  OXYGEN DATA:  The baseline oxygenation is 97%.  The oxygen saturation nadir was 85%.  The study was started without supplemental oxygen, but she had oxygen desaturation in the absence of obstructive respiratory events while in REM sleep.  She therefore had 1 liter of supplemental oxygen added with improvement in her oxygen saturation.  CARDIAC DATA:  The average heart rate was 90 and the rhythm strip showed sinus rhythm with sinus tachycardia and occasional  PVCs.  MOVEMENT-PARASOMNIA:  The periodic limb movement index was 86, and the patient had 3 restroom trips.  IMPRESSION-RECOMMENDATION:  This was a successful titration study.  She was titrated up to CPAP of 8 cm of water with control of her obstructive sleep apnea.  She did have evidence for hypoventilation during REM sleep with oxygen desaturation in the absence of apneic events.  This was controlled with the addition of 1 liter of supplemental oxygen.  She had a significant increase in her periodic limb movement index and clinical correlation would be necessary to determine the significance of this.  In addition to diet, excise, and weight reduction, I recommended the patient be started on CPAP at 8 cm of water with the addition of 1 liter of supplemental oxygen and monitored for her clinical response.     Coralyn Helling, MD Diplomat, American Board of Sleep Medicine Electronically Signed    VS/MEDQ  D:  07/01/2011 19:42:56  T:  07/02/2011 02:54:07  Job:  [id number]

## 2011-07-03 ENCOUNTER — Telehealth: Payer: Self-pay | Admitting: Pulmonary Disease

## 2011-07-03 ENCOUNTER — Encounter: Payer: Self-pay | Admitting: Pulmonary Disease

## 2011-07-03 DIAGNOSIS — G4733 Obstructive sleep apnea (adult) (pediatric): Secondary | ICD-10-CM

## 2011-07-03 DIAGNOSIS — G4736 Sleep related hypoventilation in conditions classified elsewhere: Secondary | ICD-10-CM

## 2011-07-03 NOTE — Telephone Encounter (Signed)
CPAP titration 06/21/11>>CPAP 8 cm with AHI down to 0.  +R, no S.  Needed 1 liter oxygen.  PLMI 86.  Results d/w pt.  Will proceed with setting up CPAP and continue nocturnal oxygen (I sent order to [facility]).  Will have my nurse schedule ROV in 2 months.

## 2011-07-06 ENCOUNTER — Emergency Department (HOSPITAL_COMMUNITY)
Admission: EM | Admit: 2011-07-06 | Discharge: 2011-07-07 | Disposition: A | Discharge status: Home / Self Care | Payer: Medicare Other | Attending: Emergency Medicine | Admitting: Emergency Medicine

## 2011-07-06 ENCOUNTER — Emergency Department (HOSPITAL_COMMUNITY): Payer: Medicare Other

## 2011-07-06 ENCOUNTER — Encounter (HOSPITAL_COMMUNITY): Payer: Self-pay

## 2011-07-06 DIAGNOSIS — E876 Hypokalemia: Secondary | ICD-10-CM | POA: Insufficient documentation

## 2011-07-06 DIAGNOSIS — L0390 Cellulitis, unspecified: Secondary | ICD-10-CM

## 2011-07-06 DIAGNOSIS — F17200 Nicotine dependence, unspecified, uncomplicated: Secondary | ICD-10-CM | POA: Insufficient documentation

## 2011-07-06 DIAGNOSIS — J449 Chronic obstructive pulmonary disease, unspecified: Secondary | ICD-10-CM | POA: Insufficient documentation

## 2011-07-06 DIAGNOSIS — Z853 Personal history of malignant neoplasm of breast: Secondary | ICD-10-CM | POA: Insufficient documentation

## 2011-07-06 DIAGNOSIS — I2510 Atherosclerotic heart disease of native coronary artery without angina pectoris: Secondary | ICD-10-CM | POA: Insufficient documentation

## 2011-07-06 DIAGNOSIS — K219 Gastro-esophageal reflux disease without esophagitis: Secondary | ICD-10-CM | POA: Insufficient documentation

## 2011-07-06 DIAGNOSIS — E119 Type 2 diabetes mellitus without complications: Secondary | ICD-10-CM | POA: Insufficient documentation

## 2011-07-06 DIAGNOSIS — G4733 Obstructive sleep apnea (adult) (pediatric): Secondary | ICD-10-CM | POA: Insufficient documentation

## 2011-07-06 DIAGNOSIS — J4489 Other specified chronic obstructive pulmonary disease: Secondary | ICD-10-CM | POA: Insufficient documentation

## 2011-07-06 DIAGNOSIS — I509 Heart failure, unspecified: Secondary | ICD-10-CM | POA: Insufficient documentation

## 2011-07-06 DIAGNOSIS — E785 Hyperlipidemia, unspecified: Secondary | ICD-10-CM | POA: Insufficient documentation

## 2011-07-06 DIAGNOSIS — R112 Nausea with vomiting, unspecified: Secondary | ICD-10-CM | POA: Insufficient documentation

## 2011-07-06 DIAGNOSIS — L539 Erythematous condition, unspecified: Secondary | ICD-10-CM | POA: Insufficient documentation

## 2011-07-06 MED ORDER — ONDANSETRON HCL 4 MG/2ML IJ SOLN
4.0000 mg | Freq: Once | INTRAMUSCULAR | Status: AC
  Administered 2011-07-06: 4 mg via INTRAVENOUS
  Filled 2011-07-06: qty 2

## 2011-07-06 MED ORDER — SODIUM CHLORIDE 0.9 % IV BOLUS (SEPSIS)
1000.0000 mL | Freq: Once | INTRAVENOUS | Status: AC
  Administered 2011-07-07: 1000 mL via INTRAVENOUS

## 2011-07-06 NOTE — ED Notes (Signed)
Pt stated she vomited yesterday, today attempted to eat fish, slaw, corn, snack size candy bar, and now "gagging" and can't take my meds, denies any diarrhea, or fever.

## 2011-07-06 NOTE — ED Notes (Signed)
Pt stated after tirage, that she thinks she pulled a muscle under right breast several days ago while she was sitting on a toilet.  Also has been having episodes of vomiting -like this for 2 years

## 2011-07-06 NOTE — ED Notes (Signed)
Pt assisted to restroom.  

## 2011-07-07 ENCOUNTER — Other Ambulatory Visit: Payer: Self-pay

## 2011-07-07 LAB — COMPREHENSIVE METABOLIC PANEL
CO2: 33 mEq/L — ABNORMAL HIGH (ref 19–32)
Calcium: 8.8 mg/dL (ref 8.4–10.5)
Creatinine, Ser: 0.86 mg/dL (ref 0.50–1.10)
GFR calc Af Amer: >60 mL/min (ref >60)
GFR calc non Af Amer: >60 mL/min (ref >60)
Glucose, Bld: 272 mg/dL — ABNORMAL HIGH (ref 70–99)
Sodium: 136 mEq/L (ref 135–145)
Total Protein: 7.1 g/dL (ref 6.0–8.3)

## 2011-07-07 LAB — CBC
HCT: 28.7 % — ABNORMAL LOW (ref 36.0–46.0)
Hemoglobin: 9.3 g/dL — ABNORMAL LOW (ref 12.0–15.0)
RBC: 3.35 MIL/uL — ABNORMAL LOW (ref 3.87–5.11)
WBC: 10.3 10*3/uL (ref 4.0–10.5)

## 2011-07-07 LAB — URINALYSIS, ROUTINE W REFLEX MICROSCOPIC
Glucose, UA: 500 mg/dL — AB
Ketones, ur: NEGATIVE mg/dL
Leukocytes, UA: NEGATIVE
Nitrite: NEGATIVE
Specific Gravity, Urine: 1.015 (ref 1.005–1.030)
pH: 6.5 (ref 5.0–8.0)

## 2011-07-07 LAB — URINE MICROSCOPIC-ADD ON

## 2011-07-07 LAB — LIPASE, BLOOD: Lipase: 39 U/L (ref 11–59)

## 2011-07-07 MED ORDER — ONDANSETRON HCL 4 MG PO TABS: 4.0000 mg | ORAL_TABLET | Freq: Four times a day (QID) | ORAL | Status: AC

## 2011-07-07 MED ORDER — CLINDAMYCIN PHOSPHATE 900 MG/50ML IV SOLN
900.0000 mg | Freq: Once | INTRAVENOUS | Status: AC
  Administered 2011-07-07: 900 mg via INTRAVENOUS
  Filled 2011-07-07: qty 50

## 2011-07-07 MED ORDER — FLUCONAZOLE 100 MG PO TABS
ORAL_TABLET | ORAL | Status: AC
  Filled 2011-07-07: qty 1

## 2011-07-07 MED ORDER — POTASSIUM CHLORIDE CRYS ER 20 MEQ PO TBCR
40.0000 meq | EXTENDED_RELEASE_TABLET | Freq: Once | ORAL | Status: AC
  Administered 2011-07-07: 40 meq via ORAL
  Filled 2011-07-07: qty 2

## 2011-07-07 MED ORDER — PANTOPRAZOLE SODIUM 40 MG IV SOLR
40.0000 mg | Freq: Once | INTRAVENOUS | Status: AC
  Administered 2011-07-07: 40 mg via INTRAVENOUS
  Filled 2011-07-07: qty 40

## 2011-07-07 MED ORDER — FLUCONAZOLE 100 MG PO TABS
100.0000 mg | ORAL_TABLET | Freq: Once | ORAL | Status: AC
  Administered 2011-07-07: 100 mg via ORAL

## 2011-07-07 MED ORDER — CLINDAMYCIN HCL 150 MG PO CAPS: 150.0000 mg | ORAL_CAPSULE | Freq: Four times a day (QID) | ORAL | Status: AC

## 2011-07-07 NOTE — ED Provider Notes (Addendum)
History     CSN: [id number] Arrival date & time: 07/06/2011 10:16 PM  Chief Complaint  Patient presents with  . Emesis   Patient is a 60 y.o. female presenting with vomiting. The history is provided by the patient.  Emesis  This is a recurrent problem. The current episode started yesterday. The problem occurs 2 to 4 times per day. The problem has not changed since onset.The emesis has an appearance of stomach contents. There has been no fever. Pertinent negatives include no abdominal pain, no chills, no cough, no diarrhea, no fever, no headaches and no sweats. Risk factors: no sick contacts or new medications, some heartburn but seems that same as it usually is.  PT states she gest these symptoms once or twice a month for years and usually lasts a day, this time has lasted over a day so she became more concerned.  She was able to eat a large meal tonight but has persistent symptoms.  Past Medical History  Diagnosis Date  . GERD (gastroesophageal reflux disease)   . PAD (peripheral artery disease)     Stent, embolectomy, surgery  /     Dr. Darrick Penna, ABI 0.52 right, 0.54 left, February, 2010  . COPD (chronic obstructive pulmonary disease)     Dr. Craige Cotta, medications, PFTs August, 2012  . OSA (obstructive sleep apnea)     Dr. Craige Cotta  CPAP started August, 2012  . Systolic and diastolic CHF, chronic   . CAD (coronary artery disease)     June, 2012, DES to RCA, staged DES to circumflex and DES to diagonal with pressure wire to LAD, FFR 0.84  . DM (diabetes mellitus), type 2   . Hypertension   . Morbid obesity   . Hyperlipidemia   . Ejection fraction < 50%     EF 45-50%, echo, June, 2012  . Diastolic dysfunction     grade 2, echo, June, 2012  . Peripheral neuropathy   . Tobacco abuse   . Syncope     vasovagal  . Breast cancer     lumpectomy and radiation 202    Past Surgical History  Procedure Date  . Appendectomy   . Cholecystectomy   . Tubal ligation   . Breast surgery     Left  breast cancer  . Cardiac catheterization 2002  . Popliteal artery angioplasty 11/30/10    Left leg w/ embolectomy, endarterectomy  by Dr.Charles Fields    Family History  Problem Relation Age of Onset  . Hypertension Sister   . Hyperlipidemia Sister   . Hyperlipidemia Brother   . Lung cancer Father   . Stomach cancer Sister     History  Substance Use Topics  . Smoking status: Former Smoker -- 1.5 packs/day for 44 years    Types: Cigarettes    Quit date: 11/30/2010  . Smokeless tobacco: Never Used  . Alcohol Use: No    OB History    Grav Para Term Preterm Abortions TAB SAB Ect Mult Living                  Review of Systems  Constitutional: Negative for fever and chills.  HENT: Negative for neck pain and neck stiffness.   Eyes: Negative for pain.  Respiratory: Negative for cough and shortness of breath.   Cardiovascular: Negative for chest pain.  Gastrointestinal: Positive for nausea and vomiting. Negative for abdominal pain, diarrhea, constipation and blood in stool.  Genitourinary: Negative for dysuria, urgency and flank pain.  Musculoskeletal: Negative  for back pain.  Skin: Negative for rash.  Neurological: Negative for headaches.  All other systems reviewed and are negative.    Physical Exam  BP 158/63  Pulse 99  Temp(Src) 98.5 F (36.9 C) (Oral)  Resp 20  Ht 5\' 3"  (1.6 m)  Wt 213 lb (96.616 kg)  BMI 37.73 kg/m2  SpO2 99%  Physical Exam  Constitutional: She is oriented to person, place, and time. She appears well-developed and well-nourished.  HENT:  Head: Normocephalic and atraumatic.  Eyes: Conjunctivae and EOM are normal. Pupils are equal, round, and reactive to light.  Neck: Trachea normal and full passive range of motion without pain. Neck supple. No thyromegaly present.       No meningismus  Cardiovascular: Normal rate, regular rhythm, S1 normal, S2 normal, intact distal pulses and normal pulses.     No systolic murmur is present   No  diastolic murmur is present  Pulses:      Radial pulses are 2+ on the right side, and 2+ on the left side.  Pulmonary/Chest: Effort normal and breath sounds normal. She has no wheezes. She has no rhonchi. She has no rales. She exhibits no tenderness.  Abdominal: Soft. Normal appearance and bowel sounds are normal. There is no tenderness. There is no CVA tenderness and negative Murphy's sign.       No peritonitis with deep palpation throughout, negative Murphy's sign  Musculoskeletal: Normal range of motion.       RLE: erythema and increased warmth to touch distal tib/ fib area, no calf tenderness or swelling and distal N/V intact  Neurological: She is alert and oriented to person, place, and time. She has normal strength and normal reflexes. No cranial nerve deficit or sensory deficit. She displays a negative Romberg sign. GCS eye subscore is 4. GCS verbal subscore is 5. GCS motor subscore is 6.       Normal Gait  Skin: Skin is warm and dry. No rash noted. She is not diaphoretic. No cyanosis. Nails show no clubbing.  Psychiatric: She has a normal mood and affect. Her speech is normal and behavior is normal.       Cooperative and appropriate    ED Course  Procedures   Date: 07/07/2011  Rate: 93  Rhythm: normal sinus rhythm  QRS Axis: left  Intervals: normal  ST/T Wave abnormalities: nonspecific ST changes  Conduction Disutrbances:LVH  Narrative Interpretation:   Old EKG Reviewed: none available     MDM Exam suggest cellulitis RLE. IV ABx for same with IVFs and zofran for nasuea. Recheck at 12:25 am nausea resolved. PO potassium and precautions for same, has close follow up with Dr Clelia Croft. PT appears well for discharge and she wants to go home. RX zofran and clindamycin. PT states understanding strict return precautions for any worsening condition or persistent emesis despite meds.    Dg Abd Acute W/chest  07/07/2011  *RADIOLOGY REPORT*  Clinical Data: Nausea, vomiting.  ACUTE ABDOMEN  SERIES (ABDOMEN 2 VIEW & CHEST 1 VIEW)  Comparison: CT 01/05/2009  Findings: Mild cardiomegaly.  Lungs are clear.  No effusions or acute bony abnormality.  Prior cholecystectomy.  Moderate stool throughout the colon.  No obstruction or free air.  No organomegaly or suspicious calcification.  No acute bony abnormality.  IMPRESSION: No obstruction or free air.  Moderate stool burden.  Cardiomegaly.  Original Report Authenticated By: Cyndie Chime, M.D.   Results for orders placed during the hospital encounter of 07/06/11 (from the past  24 hour(s))  COMPREHENSIVE METABOLIC PANEL     Status: Abnormal   Collection Time   07/07/11 12:26 AM      Component Value Range   Sodium 136  135 - 145 (mEq/L)   Potassium 2.3 (*) 3.5 - 5.1 (mEq/L)   Chloride 95 (*) 96 - 112 (mEq/L)   CO2 33 (*) 19 - 32 (mEq/L)   Glucose, Bld 272 (*) 70 - 99 (mg/dL)   BUN 7  6 - 23 (mg/dL)   Creatinine, Ser 1.61  0.50 - 1.10 (mg/dL)   Calcium 8.8  8.4 - 09.6 (mg/dL)   Total Protein 7.1  6.0 - 8.3 (g/dL)   Albumin 2.1 (*) 3.5 - 5.2 (g/dL)   AST 9  0 - 37 (U/L)   ALT 8  0 - 35 (U/L)   Alkaline Phosphatase 132 (*) 39 - 117 (U/L)   Total Bilirubin 0.4  0.3 - 1.2 (mg/dL)   GFR calc non Af Amer >60  >60 (mL/min)   GFR calc Af Amer >60  >60 (mL/min)  CBC     Status: Abnormal   Collection Time   07/07/11 12:26 AM      Component Value Range   WBC 10.3  4.0 - 10.5 (K/uL)   RBC 3.35 (*) 3.87 - 5.11 (MIL/uL)   Hemoglobin 9.3 (*) 12.0 - 15.0 (g/dL)   HCT 04.5 (*) 40.9 - 46.0 (%)   MCV 85.7  78.0 - 100.0 (fL)   MCH 27.8  26.0 - 34.0 (pg)   MCHC 32.4  30.0 - 36.0 (g/dL)   RDW 81.1  91.4 - 78.2 (%)   Platelets 366  150 - 400 (K/uL)  LIPASE, BLOOD     Status: Normal   Collection Time   07/07/11 12:26 AM      Component Value Range   Lipase 39  11 - 59 (U/L)  URINALYSIS, ROUTINE W REFLEX MICROSCOPIC     Status: Abnormal   Collection Time   07/07/11  2:12 AM      Component Value Range   Color, Urine YELLOW  YELLOW    Appearance  CLEAR  CLEAR    Specific Gravity, Urine 1.015  1.005 - 1.030    pH 6.5  5.0 - 8.0    Glucose, UA 500 (*) NEGATIVE (mg/dL)   Hgb urine dipstick MODERATE (*) NEGATIVE    Bilirubin Urine NEGATIVE  NEGATIVE    Ketones, ur NEGATIVE  NEGATIVE (mg/dL)   Protein, ur 956 (*) NEGATIVE (mg/dL)   Urobilinogen, UA 0.2  0.0 - 1.0 (mg/dL)   Nitrite NEGATIVE  NEGATIVE    Leukocytes, UA NEGATIVE  NEGATIVE   URINE MICROSCOPIC-ADD ON     Status: Abnormal   Collection Time   07/07/11  2:12 AM      Component Value Range   Squamous Epithelial / LPF FEW (*) RARE    WBC, UA 3-6  <3 (WBC/hpf)   RBC / HPF 3-6  <3 (RBC/hpf)   Bacteria, UA RARE  RARE    Urine-Other YEAST       Sunnie Nielsen, MD 07/07/11 [phone]

## 2011-07-07 NOTE — ED Notes (Signed)
Pt given ice chips

## 2011-07-07 NOTE — ED Notes (Signed)
Critical lab called from lab and read back - K+ 2.3 - MD notified

## 2011-07-09 NOTE — Telephone Encounter (Signed)
Pt is coming in on 10/22 at 1:45 for follow up

## 2011-07-30 ENCOUNTER — Ambulatory Visit: Payer: Medicare Other | Admitting: Cardiology

## 2011-07-30 ENCOUNTER — Encounter: Payer: Self-pay | Admitting: Cardiology

## 2011-08-28 ENCOUNTER — Encounter: Payer: Self-pay | Admitting: Pulmonary Disease

## 2011-09-02 ENCOUNTER — Encounter: Payer: Self-pay | Admitting: Pulmonary Disease

## 2011-09-02 ENCOUNTER — Ambulatory Visit (INDEPENDENT_AMBULATORY_CARE_PROVIDER_SITE_OTHER): Payer: Medicare Other | Admitting: Pulmonary Disease

## 2011-09-02 DIAGNOSIS — G4733 Obstructive sleep apnea (adult) (pediatric): Secondary | ICD-10-CM

## 2011-09-02 DIAGNOSIS — Z23 Encounter for immunization: Secondary | ICD-10-CM

## 2011-09-02 DIAGNOSIS — J449 Chronic obstructive pulmonary disease, unspecified: Secondary | ICD-10-CM

## 2011-09-02 NOTE — Progress Notes (Signed)
Addended by: Alecia Lemming on: 09/02/2011 03:18 PM   Modules accepted: Orders

## 2011-09-02 NOTE — Assessment & Plan Note (Signed)
Stable on prn combivent.  Will give influenza vaccine today.

## 2011-09-02 NOTE — Patient Instructions (Signed)
Flu shot today Will get report from CPAP machine and call with results Follow up in 6 months

## 2011-09-02 NOTE — Assessment & Plan Note (Signed)
She has symptomatic improvement and reports compliance with CPAP therapy.  Will get her CPAP download and call her with results.

## 2011-09-02 NOTE — Progress Notes (Signed)
Chief Complaint  Patient presents with  . COPD    breathing is doing good,denies any cough or wheezing,fever.    History of Present Illness:  CC: [name]  60 yo female former smoker with OSA and COPD.  PFT 06/17/11>>FEV1 1.73(80%), FEV1% 83, TLC 3.63(77%), DLCO 54%, no BD response. Likely combined obstruction and restrictive defect.  Her breathing has been doing okay.  She does not have much cough, wheeze, or sputum.  She has not needed to use combivent much.  CPAP titration 06/21/11>>CPAP 8 cm with AHI down to 0. +R, no S. Needed 1 liter oxygen. PLMI 86.  Using full mask.  She has been sleeping better, and feeling better.  She still gets tired in day, and wakes up several times at night.  Past Medical History  Diagnosis Date  . GERD (gastroesophageal reflux disease)   . PAD (peripheral artery disease)     Stent, embolectomy, surgery  /     Dr. Darrick Penna, ABI 0.52 right, 0.54 left, February, 2010  . COPD (chronic obstructive pulmonary disease)     Dr. Craige Cotta, medications, PFTs August, 2012  . OSA (obstructive sleep apnea)     Dr. Craige Cotta  CPAP started August, 2012  . Systolic and diastolic CHF, chronic   . CAD (coronary artery disease)     June, 2012, DES to RCA, staged DES to circumflex and DES to diagonal with pressure wire to LAD, FFR 0.84  . DM (diabetes mellitus), type 2   . Hypertension   . Morbid obesity   . Hyperlipidemia   . Ejection fraction < 50%     EF 45-50%, echo, June, 2012  . Diastolic dysfunction     grade 2, echo, June, 2012  . Peripheral neuropathy   . Tobacco abuse   . Syncope     vasovagal  . Breast cancer     lumpectomy and radiation 202    Past Surgical History  Procedure Date  . Appendectomy   . Cholecystectomy   . Tubal ligation   . Breast surgery     Left breast cancer  . Cardiac catheterization 2002  . Popliteal artery angioplasty 11/30/10    Left leg w/ embolectomy, endarterectomy  by Dr.Charles Fields    Current Outpatient Prescriptions  on File Prior to Visit  Medication Sig Dispense Refill  . albuterol-ipratropium (COMBIVENT) 18-103 MCG/ACT inhaler Inhale 2 puffs into the lungs every 3 (three) hours as needed.       Marland Kitchen amLODipine (NORVASC) 5 MG tablet Take 5 mg by mouth daily.       Marland Kitchen aspirin 81 MG tablet Take 81 mg by mouth daily.        . clopidogrel (PLAVIX) 75 MG tablet Take 75 mg by mouth daily.        . furosemide (LASIX) 40 MG tablet Take 40 mg by mouth daily.        . insulin glargine (LANTUS) 100 UNIT/ML injection Inject 48 Units into the skin at bedtime. 24u sq in morning      . insulin regular (HUMULIN R,NOVOLIN R) 100 units/mL injection Inject into the skin as directed. Per sliding scale       . metoCLOPramide (REGLAN) 5 MG tablet Take 5 mg by mouth 4 (four) times daily.        . metoprolol tartrate (LOPRESSOR) 25 MG tablet Take 25 mg by mouth 2 (two) times daily.        . nitroGLYCERIN (NITROSTAT) 0.4 MG SL tablet Place 1  tablet (0.4 mg total) under the tongue every 5 (five) minutes as needed for chest pain.  25 tablet  3  . pantoprazole (PROTONIX) 40 MG tablet Take 40 mg by mouth daily.        . rosuvastatin (CRESTOR) 40 MG tablet Take 40 mg by mouth daily.        Marland Kitchen acetaminophen (TYLENOL) 500 MG tablet Take 500 mg by mouth every 6 (six) hours as needed.          No Known Allergies  Physical Exam:  Blood pressure 148/78, pulse 100, temperature 98.6 F (37 C), temperature source Oral, height 5\' 3"  (1.6 m), weight 216 lb 12.8 oz (98.34 kg), SpO2 99.00%.  General - Obese HEENT - No sinus tenderness, no oral exudate, no LAN Cardiac - S1S1 no murmur Chest - no wheeze/rales/dullness Abdomen - soft, nontender Extremities - no e/c/c Skin - no rashes Neurologic - normal strength, CN intact Psychiatric - normal mood, behavior   Assessment/Plan:  COPD (chronic obstructive pulmonary disease) Stable on prn combivent.  Will give influenza vaccine today.  OSA (obstructive sleep apnea) She has symptomatic  improvement and reports compliance with CPAP therapy.  Will get her CPAP download and call her with results.     Outpatient Encounter Prescriptions as of 09/02/2011  Medication Sig Dispense Refill  . albuterol-ipratropium (COMBIVENT) 18-103 MCG/ACT inhaler Inhale 2 puffs into the lungs every 3 (three) hours as needed.       Marland Kitchen amLODipine (NORVASC) 5 MG tablet Take 5 mg by mouth daily.       Marland Kitchen aspirin 81 MG tablet Take 81 mg by mouth daily.        . clindamycin (CLEOCIN) 150 MG capsule       . clopidogrel (PLAVIX) 75 MG tablet Take 75 mg by mouth daily.        . furosemide (LASIX) 40 MG tablet Take 40 mg by mouth daily.        . insulin glargine (LANTUS) 100 UNIT/ML injection Inject 48 Units into the skin at bedtime. 24u sq in morning      . insulin regular (HUMULIN R,NOVOLIN R) 100 units/mL injection Inject into the skin as directed. Per sliding scale       . metoCLOPramide (REGLAN) 5 MG tablet Take 5 mg by mouth 4 (four) times daily.        . metoprolol tartrate (LOPRESSOR) 25 MG tablet Take 25 mg by mouth 2 (two) times daily.        . nitroGLYCERIN (NITROSTAT) 0.4 MG SL tablet Place 1 tablet (0.4 mg total) under the tongue every 5 (five) minutes as needed for chest pain.  25 tablet  3  . ondansetron (ZOFRAN) 4 MG tablet       . pantoprazole (PROTONIX) 40 MG tablet Take 40 mg by mouth daily.        . rosuvastatin (CRESTOR) 40 MG tablet Take 40 mg by mouth daily.        Marland Kitchen acetaminophen (TYLENOL) 500 MG tablet Take 500 mg by mouth every 6 (six) hours as needed.          [name] 09/02/2011, 2:53 PM

## 2011-09-04 ENCOUNTER — Encounter: Payer: Self-pay | Admitting: Pulmonary Disease

## 2011-09-11 ENCOUNTER — Encounter: Payer: Self-pay | Admitting: Pulmonary Disease

## 2011-09-11 ENCOUNTER — Telehealth: Payer: Self-pay | Admitting: Pulmonary Disease

## 2011-09-11 NOTE — Telephone Encounter (Signed)
CPAP 07/19/11 to 09/06/11>>Used on 6 of 49 nights with average 1 hr 2 min.  Average AHI 1.4 with CPAP 8 cm H2O.  Will have my nurse inform patient that CPAP reports shows good control of sleep apnea when she uses her machine.  However, she needs to use her machine every night for the entire time she is asleep.

## 2011-09-12 NOTE — Telephone Encounter (Signed)
I spoke with patient about results and she verbalized understanding and had no questions. I advised her of VS recs and she stated she will try to use her machine everynight all night.

## 2011-10-08 ENCOUNTER — Encounter: Payer: Self-pay | Admitting: Pulmonary Disease

## 2011-10-12 DIAGNOSIS — Z8673 Personal history of transient ischemic attack (TIA), and cerebral infarction without residual deficits: Secondary | ICD-10-CM

## 2011-10-12 HISTORY — DX: Personal history of transient ischemic attack (TIA), and cerebral infarction without residual deficits: Z86.73

## 2011-10-19 ENCOUNTER — Inpatient Hospital Stay (HOSPITAL_COMMUNITY)
Admission: EM | Admit: 2011-10-19 | Discharge: 2011-10-23 | DRG: 065 | Disposition: A | Discharge status: Rehabilitation Facility | Payer: Medicare Other | Attending: Internal Medicine | Admitting: Internal Medicine

## 2011-10-19 ENCOUNTER — Emergency Department (HOSPITAL_COMMUNITY): Payer: Medicare Other

## 2011-10-19 ENCOUNTER — Encounter (HOSPITAL_COMMUNITY): Payer: Self-pay | Admitting: *Deleted

## 2011-10-19 ENCOUNTER — Other Ambulatory Visit: Payer: Self-pay

## 2011-10-19 DIAGNOSIS — Z66 Do not resuscitate: Secondary | ICD-10-CM | POA: Diagnosis present

## 2011-10-19 DIAGNOSIS — E876 Hypokalemia: Secondary | ICD-10-CM | POA: Diagnosis present

## 2011-10-19 DIAGNOSIS — E119 Type 2 diabetes mellitus without complications: Secondary | ICD-10-CM | POA: Diagnosis present

## 2011-10-19 DIAGNOSIS — I509 Heart failure, unspecified: Secondary | ICD-10-CM | POA: Diagnosis present

## 2011-10-19 DIAGNOSIS — I2510 Atherosclerotic heart disease of native coronary artery without angina pectoris: Secondary | ICD-10-CM | POA: Diagnosis present

## 2011-10-19 DIAGNOSIS — J4489 Other specified chronic obstructive pulmonary disease: Secondary | ICD-10-CM | POA: Diagnosis present

## 2011-10-19 DIAGNOSIS — G4733 Obstructive sleep apnea (adult) (pediatric): Secondary | ICD-10-CM | POA: Diagnosis present

## 2011-10-19 DIAGNOSIS — I6350 Cerebral infarction due to unspecified occlusion or stenosis of unspecified cerebral artery: Principal | ICD-10-CM | POA: Diagnosis present

## 2011-10-19 DIAGNOSIS — I5042 Chronic combined systolic (congestive) and diastolic (congestive) heart failure: Secondary | ICD-10-CM | POA: Diagnosis present

## 2011-10-19 DIAGNOSIS — I639 Cerebral infarction, unspecified: Secondary | ICD-10-CM | POA: Diagnosis present

## 2011-10-19 DIAGNOSIS — I739 Peripheral vascular disease, unspecified: Secondary | ICD-10-CM | POA: Diagnosis present

## 2011-10-19 DIAGNOSIS — G8190 Hemiplegia, unspecified affecting unspecified side: Secondary | ICD-10-CM | POA: Diagnosis present

## 2011-10-19 DIAGNOSIS — I10 Essential (primary) hypertension: Secondary | ICD-10-CM | POA: Diagnosis present

## 2011-10-19 DIAGNOSIS — J449 Chronic obstructive pulmonary disease, unspecified: Secondary | ICD-10-CM | POA: Diagnosis present

## 2011-10-19 DIAGNOSIS — E785 Hyperlipidemia, unspecified: Secondary | ICD-10-CM | POA: Diagnosis present

## 2011-10-19 DIAGNOSIS — IMO0001 Reserved for inherently not codable concepts without codable children: Secondary | ICD-10-CM | POA: Diagnosis present

## 2011-10-19 DIAGNOSIS — R739 Hyperglycemia, unspecified: Secondary | ICD-10-CM

## 2011-10-19 HISTORY — DX: Old myocardial infarction: I25.2

## 2011-10-19 LAB — URINALYSIS, ROUTINE W REFLEX MICROSCOPIC
Bilirubin Urine: NEGATIVE
Protein, ur: 100 mg/dL — AB
Urobilinogen, UA: 0.2 mg/dL (ref 0.0–1.0)

## 2011-10-19 LAB — DIFFERENTIAL
Eosinophils Absolute: 0.1 10*3/uL (ref 0.0–0.7)
Eosinophils Relative: 1 % (ref 0–5)
Lymphs Abs: 1.1 10*3/uL (ref 0.7–4.0)

## 2011-10-19 LAB — COMPREHENSIVE METABOLIC PANEL
BUN: 13 mg/dL (ref 6–23)
Calcium: 9.2 mg/dL (ref 8.4–10.5)
GFR calc Af Amer: >90 mL/min (ref >90)
Glucose, Bld: 417 mg/dL — ABNORMAL HIGH (ref 70–99)
Sodium: 137 mEq/L (ref 135–145)
Total Protein: 6.8 g/dL (ref 6.0–8.3)

## 2011-10-19 LAB — CK TOTAL AND CKMB (NOT AT ARMC)
Relative Index: INVALID (ref 0.0–2.5)
Total CK: 77 U/L (ref 7–177)

## 2011-10-19 LAB — CBC
MCH: 28.1 pg (ref 26.0–34.0)
MCHC: 32.6 g/dL (ref 30.0–36.0)
MCV: 86.2 fL (ref 78.0–100.0)
Platelets: 267 10*3/uL (ref 150–400)
RBC: 4.2 MIL/uL (ref 3.87–5.11)

## 2011-10-19 LAB — GLUCOSE, CAPILLARY: Glucose-Capillary: 406 mg/dL — ABNORMAL HIGH (ref 70–99)

## 2011-10-19 LAB — TROPONIN I: Troponin I: <0.3 ng/mL (ref <0.30)

## 2011-10-19 LAB — PROTIME-INR: Prothrombin Time: 12.8 seconds (ref 11.6–15.2)

## 2011-10-19 MED ORDER — INSULIN ASPART 100 UNIT/ML ~~LOC~~ SOLN
0.0000 [IU] | Freq: Three times a day (TID) | SUBCUTANEOUS | Status: DC
  Administered 2011-10-20: 11 [IU] via SUBCUTANEOUS
  Administered 2011-10-20: 20 [IU] via SUBCUTANEOUS
  Administered 2011-10-20 – 2011-10-21 (×3): 11 [IU] via SUBCUTANEOUS
  Administered 2011-10-21 – 2011-10-22 (×2): 20 [IU] via SUBCUTANEOUS
  Administered 2011-10-22: 7 [IU] via SUBCUTANEOUS
  Administered 2011-10-23: 11 [IU] via SUBCUTANEOUS
  Administered 2011-10-23: 7 [IU] via SUBCUTANEOUS

## 2011-10-19 MED ORDER — METOPROLOL TARTRATE 25 MG PO TABS
25.0000 mg | ORAL_TABLET | Freq: Two times a day (BID) | ORAL | Status: DC
  Administered 2011-10-19 – 2011-10-23 (×8): 25 mg via ORAL
  Filled 2011-10-19 (×8): qty 1

## 2011-10-19 MED ORDER — NITROGLYCERIN 0.4 MG SL SUBL: 0.4000 mg | SUBLINGUAL_TABLET | SUBLINGUAL | Status: DC | PRN

## 2011-10-19 MED ORDER — INSULIN GLARGINE 100 UNIT/ML ~~LOC~~ SOLN
48.0000 [IU] | Freq: Every day | SUBCUTANEOUS | Status: DC
  Administered 2011-10-19: 48 [IU] via SUBCUTANEOUS
  Filled 2011-10-19: qty 3

## 2011-10-19 MED ORDER — ROSUVASTATIN CALCIUM 20 MG PO TABS
40.0000 mg | ORAL_TABLET | Freq: Every day | ORAL | Status: DC
  Administered 2011-10-19 – 2011-10-23 (×5): 40 mg via ORAL
  Filled 2011-10-19: qty 1
  Filled 2011-10-19 (×4): qty 2
  Filled 2011-10-19: qty 1

## 2011-10-19 MED ORDER — SODIUM CHLORIDE 0.9 % IV SOLN
INTRAVENOUS | Status: DC
  Administered 2011-10-19: 17:00:00 via INTRAVENOUS

## 2011-10-19 MED ORDER — ASPIRIN EC 325 MG PO TBEC
325.0000 mg | DELAYED_RELEASE_TABLET | Freq: Every day | ORAL | Status: DC
  Administered 2011-10-19 – 2011-10-23 (×5): 325 mg via ORAL
  Filled 2011-10-19 (×5): qty 1

## 2011-10-19 MED ORDER — ENOXAPARIN SODIUM 40 MG/0.4ML ~~LOC~~ SOLN
40.0000 mg | SUBCUTANEOUS | Status: DC
  Administered 2011-10-19 – 2011-10-22 (×4): 40 mg via SUBCUTANEOUS
  Filled 2011-10-19 (×4): qty 0.4

## 2011-10-19 MED ORDER — INSULIN ASPART 100 UNIT/ML ~~LOC~~ SOLN
10.0000 [IU] | Freq: Once | SUBCUTANEOUS | Status: AC
  Administered 2011-10-19: 10 [IU] via SUBCUTANEOUS

## 2011-10-19 MED ORDER — SENNOSIDES-DOCUSATE SODIUM 8.6-50 MG PO TABS: 1.0000 | ORAL_TABLET | Freq: Every evening | ORAL | Status: DC | PRN

## 2011-10-19 MED ORDER — FUROSEMIDE 40 MG PO TABS
40.0000 mg | ORAL_TABLET | Freq: Every day | ORAL | Status: DC
  Administered 2011-10-19 – 2011-10-23 (×5): 40 mg via ORAL
  Filled 2011-10-19 (×5): qty 1

## 2011-10-19 MED ORDER — ONDANSETRON HCL 4 MG/2ML IJ SOLN: 4.0000 mg | Freq: Four times a day (QID) | INTRAMUSCULAR | Status: DC | PRN

## 2011-10-19 MED ORDER — ACETAMINOPHEN 325 MG PO TABS: 650.0000 mg | ORAL_TABLET | ORAL | Status: DC | PRN

## 2011-10-19 MED ORDER — CLOPIDOGREL BISULFATE 75 MG PO TABS
75.0000 mg | ORAL_TABLET | Freq: Every day | ORAL | Status: DC
  Administered 2011-10-19 – 2011-10-23 (×5): 75 mg via ORAL
  Filled 2011-10-19 (×5): qty 1

## 2011-10-19 MED ORDER — PANTOPRAZOLE SODIUM 40 MG PO TBEC
40.0000 mg | DELAYED_RELEASE_TABLET | Freq: Every day | ORAL | Status: DC
  Administered 2011-10-19 – 2011-10-23 (×5): 40 mg via ORAL
  Filled 2011-10-19 (×5): qty 1

## 2011-10-19 MED ORDER — METOCLOPRAMIDE HCL 10 MG PO TABS
5.0000 mg | ORAL_TABLET | Freq: Four times a day (QID) | ORAL | Status: DC
  Administered 2011-10-19 – 2011-10-21 (×6): 5 mg via ORAL
  Administered 2011-10-21: 11:00:00 via ORAL
  Administered 2011-10-21 – 2011-10-23 (×4): 5 mg via ORAL
  Filled 2011-10-19 (×10): qty 1

## 2011-10-19 MED ORDER — IPRATROPIUM-ALBUTEROL 18-103 MCG/ACT IN AERO
2.0000 | INHALATION_SPRAY | RESPIRATORY_TRACT | Status: DC | PRN
  Filled 2011-10-19: qty 14.7

## 2011-10-19 MED ORDER — AMLODIPINE BESYLATE 5 MG PO TABS
5.0000 mg | ORAL_TABLET | Freq: Every day | ORAL | Status: DC
  Administered 2011-10-19 – 2011-10-23 (×5): 5 mg via ORAL
  Filled 2011-10-19 (×5): qty 1

## 2011-10-19 MED ORDER — INSULIN ASPART 100 UNIT/ML ~~LOC~~ SOLN
0.0000 [IU] | Freq: Every day | SUBCUTANEOUS | Status: DC
  Administered 2011-10-19: 3 [IU] via SUBCUTANEOUS
  Administered 2011-10-20 – 2011-10-21 (×2): 4 [IU] via SUBCUTANEOUS
  Administered 2011-10-22: 3 [IU] via SUBCUTANEOUS
  Filled 2011-10-19: qty 3

## 2011-10-19 MED ORDER — ACETAMINOPHEN 650 MG RE SUPP: 650.0000 mg | RECTAL | Status: DC | PRN

## 2011-10-19 NOTE — ED Notes (Signed)
C/o right leg numbness, also in right arm since 0330, last normal yesterday

## 2011-10-19 NOTE — ED Notes (Signed)
Patient transported to MRI 

## 2011-10-19 NOTE — ED Notes (Signed)
Patient transported to CT 

## 2011-10-19 NOTE — ED Provider Notes (Signed)
History  Scribed for Felisa Bonier, MD, the patient was seen in room APA09/APA09. This chart was scribed by Candelaria Stagers. The patient's care started at 3:44 PM    CSN: [id number] Arrival date & time: 10/19/2011  3:20 PM   First MD Initiated Contact with Patient 10/19/11 1525      Chief Complaint  Patient presents with  . Numbness     The history is provided by the patient.   [name] is a 60 y.o. female who presents to the Emergency Department complaining of numbness of the whole RLE which began about 12 hours ago when the pt woke from sleep to go to the bathroom.  Pt states she is experiencing weakness in the RLE and the RUE.  She has trouble ambulating since the onset of numbness.  She has a h/o diabetes, HTN, and high cholesterol.  She denies chest pain or back pain.  Pt has a h/o peripheral vascular stent placement as well as CAD.  Her PCP is Dr. Clelia Croft in [place], [place].         Past Medical History  Diagnosis Date  . GERD (gastroesophageal reflux disease)   . PAD (peripheral artery disease)     Stent, embolectomy, surgery  /     Dr. Darrick Penna, ABI 0.52 right, 0.54 left, February, 2010  . COPD (chronic obstructive pulmonary disease)     Dr. Craige Cotta, medications, PFTs August, 2012  . OSA (obstructive sleep apnea)     Dr. Craige Cotta  CPAP started August, 2012  . Systolic and diastolic CHF, chronic   . CAD (coronary artery disease)     June, 2012, DES to RCA, staged DES to circumflex and DES to diagonal with pressure wire to LAD, FFR 0.84  . DM (diabetes mellitus), type 2   . Hypertension   . Morbid obesity   . Hyperlipidemia   . Ejection fraction < 50%     EF 45-50%, echo, June, 2012  . Diastolic dysfunction     grade 2, echo, June, 2012  . Peripheral neuropathy   . Tobacco abuse   . Syncope     vasovagal  . Breast cancer     lumpectomy and radiation 2002  . MI, old     Past Surgical History  Procedure Date  . Appendectomy   . Cholecystectomy   . Tubal ligation     . Breast surgery     Left breast cancer  . Cardiac catheterization 2002  . Popliteal artery angioplasty 11/30/10    Left leg w/ embolectomy, endarterectomy  by Dr.Charles Fields  . Coronary angioplasty with stent placement     Family History  Problem Relation Age of Onset  . Hypertension Sister   . Hyperlipidemia Sister   . Hyperlipidemia Brother   . Lung cancer Father   . Stomach cancer Sister     History  Substance Use Topics  . Smoking status: Former Smoker -- 1.5 packs/day for 44 years    Types: Cigarettes    Quit date: 11/30/2010  . Smokeless tobacco: Never Used  . Alcohol Use: No    OB History    Grav Para Term Preterm Abortions TAB SAB Ect Mult Living                  Review of Systems  Constitutional: Negative for fever.  HENT: Negative for rhinorrhea.   Eyes: Negative for pain.  Respiratory: Negative for cough and shortness of breath.   Cardiovascular: Negative for chest  pain.  Gastrointestinal: Negative for nausea, vomiting, abdominal pain and diarrhea.  Genitourinary: Negative for dysuria.  Musculoskeletal: Positive for gait problem (Difficulty ambulating since onset of numbness). Negative for back pain.  Skin: Negative for rash.  Neurological: Positive for numbness (RLE numbness). Negative for weakness and headaches.    Allergies  Review of patient's allergies indicates no known allergies.  Home Medications   Current Outpatient Rx  Name Route Sig Dispense Refill  . ACETAMINOPHEN 500 MG PO TABS Oral Take 500 mg by mouth every 6 (six) hours as needed.      Maximino Greenland 18-103 MCG/ACT IN AERO Inhalation Inhale 2 puffs into the lungs every 3 (three) hours as needed.     Marland Kitchen AMLODIPINE BESYLATE 5 MG PO TABS Oral Take 5 mg by mouth daily.     . ASPIRIN 81 MG PO TABS Oral Take 81 mg by mouth daily.      Marland Kitchen CLINDAMYCIN HCL 150 MG PO CAPS      . CLOPIDOGREL BISULFATE 75 MG PO TABS Oral Take 75 mg by mouth daily.      . FUROSEMIDE 40 MG PO TABS  Oral Take 40 mg by mouth daily.      . INSULIN GLARGINE 100 UNIT/ML [place] SOLN Subcutaneous Inject 48 Units into the skin at bedtime. 24u sq in morning    . INSULIN REGULAR HUMAN 100 UNIT/ML IJ SOLN Subcutaneous Inject into the skin as directed. Per sliding scale     . METOCLOPRAMIDE HCL 5 MG PO TABS Oral Take 5 mg by mouth 4 (four) times daily.      Marland Kitchen METOPROLOL TARTRATE 25 MG PO TABS Oral Take 25 mg by mouth 2 (two) times daily.      Marland Kitchen NITROGLYCERIN 0.4 MG SL SUBL Sublingual Place 1 tablet (0.4 mg total) under the tongue every 5 (five) minutes as needed for chest pain. 25 tablet 3  . ONDANSETRON HCL 4 MG PO TABS      . PANTOPRAZOLE SODIUM 40 MG PO TBEC Oral Take 40 mg by mouth daily.      Marland Kitchen ROSUVASTATIN CALCIUM 40 MG PO TABS Oral Take 40 mg by mouth daily.        BP 146/86  Pulse 56  Temp(Src) 98.4 F (36.9 C) (Oral)  Resp 20  Ht 5\' 3"  (1.6 m)  Wt 229 lb (103.874 kg)  BMI 40.57 kg/m2  Physical Exam  Nursing note and vitals reviewed. Constitutional: She is oriented to person, place, and time. She appears well-developed and well-nourished. No distress.  HENT:  Head: Normocephalic and atraumatic.  Eyes: EOM are normal. Pupils are equal, round, and reactive to light.  Neck: Neck supple. No tracheal deviation present.  Cardiovascular: Regular rhythm and normal heart sounds.  Exam reveals no gallop and no friction rub.   No murmur heard.      Mild tachycardia. Normal symmetrical femoral pulses Normal symmetrical radial pulses. Normal symmetrical dorsalis pedis pulses.   Pulmonary/Chest: Effort normal and breath sounds normal. No respiratory distress.       Lungs clear bilaterally   Abdominal: Soft. Bowel sounds are normal. There is no tenderness.  Musculoskeletal: Normal range of motion. She exhibits no edema.          Neurological: She is alert and oriented to person, place, and time. No cranial nerve deficit or sensory deficit.       RLE drifts to bed almost immediately, strength  4/5.   No drift in LLE.  RUE pronator drift.  Skin: Skin is warm and dry.  Psychiatric: She has a normal mood and affect. Her behavior is normal.    ED Course  Procedures   DIAGNOSTIC STUDIES: Oxygen Saturation is 100% on room air, normal by my interpretation.    COORDINATION OF CARE:  3:57PM Ordered: URINALYSIS, ROUTINE W REFLEX MICROSCOPIC, ETHANOL, TROPONIN I, CK TOTAL AND CKMB, COMPREHENSIVE METABOLIC PANEL, DIFFERENTIAL, CBC, APTT, PROTIME-INR, DG CHEST 2 VIEW,CT HEAD W/O CONTRAST  4:32PM Ordered: URINE MICROSCOPIC-    Date: 10/19/2011  Rate: 106  Rhythm: sinus tachycardia  QRS Axis: left  Intervals: QT prolonged  ST/T Wave abnormalities: nonspecific ST/T changes  Conduction Disutrbances:none  Narrative Interpretation: sinus tachycardia otherwise non provocative EKG  Old EKG Reviewed: none available  Blood Pressure:  RUE 149/63 LUE 131/63    Labs Reviewed  CBC - Abnormal; Notable for the following:    WBC 11.2 (*)    Hemoglobin 11.8 (*)    All other components within normal limits  DIFFERENTIAL - Abnormal; Notable for the following:    Neutrophils Relative 83 (*)    Neutro Abs 9.2 (*)    Lymphocytes Relative 10 (*)    All other components within normal limits  COMPREHENSIVE METABOLIC PANEL - Abnormal; Notable for the following:    Potassium 3.4 (*)    Glucose, Bld 417 (*)    Albumin 2.7 (*)    Alkaline Phosphatase 150 (*)    All other components within normal limits  CK TOTAL AND CKMB - Abnormal; Notable for the following:    CK, MB 4.2 (*)    All other components within normal limits  URINALYSIS, ROUTINE W REFLEX MICROSCOPIC - Abnormal; Notable for the following:    Glucose, UA >1000 (*)    Hgb urine dipstick MODERATE (*)    Protein, ur 100 (*)    All other components within normal limits  URINE MICROSCOPIC-ADD ON - Abnormal; Notable for the following:    Squamous Epithelial / LPF MANY (*)    Bacteria, UA FEW (*)    All other components within  normal limits  GLUCOSE, CAPILLARY - Abnormal; Notable for the following:    Glucose-Capillary 406 (*)    All other components within normal limits  PROTIME-INR  APTT  TROPONIN I  ETHANOL  POCT CBG MONITORING   Dg Chest 2 View  10/19/2011  *RADIOLOGY REPORT*  Clinical Data: Right hemiparesis.  Question mediastinal widening.  CHEST - 2 VIEW  Comparison: PA and lateral chest 08/22/2005 and single view of the chest 05/07/2011.  Findings: There is cardiomegaly.  Cardiac and mediastinal contours are unchanged.  Pulmonary edema seen on the most recent prior study has resolved.  No pleural effusion. Surgical clips left axilla noted in  IMPRESSION: Cardiomegaly without acute disease.  Unchanged appearance of the cardiomediastinal silhouette.  Original Report Authenticated By: Bernadene Bell. Maricela Curet, M.D.   Ct Head Wo Contrast  10/19/2011  *RADIOLOGY REPORT*  Clinical Data: Upper lower extremity weakness.  CT HEAD WITHOUT CONTRAST  Technique:  Contiguous axial images were obtained from the base of the skull through the vertex without contrast.  Comparison: None.  Findings: A focal area of hypoattenuation is present along the posterior limb of the left external capsule.  This could account for the patient's right-sided symptoms.  Minimal periventricular white matter hypoattenuation is present otherwise.  There may be some involvement of the lateral left thalamus.  No other acute cortical infarct is present. An 8 mm hypodense lesion is present along the septum placement,  near the foramen of Monro.  This may represent a colloid cyst.  There is no other hemorrhage or mass lesion.  No significant extra-axial fluid collection is present.  The paranasal sinuses and mastoid air cells are clear.  The osseous skull is intact.  IMPRESSION:  1.  An ill-defined 10 mm area of hypoattenuation involves the posterior limb of the left internal capsule and lateral thalamus. This may represent an age indeterminate infarct.  Acute  infarction is not excluded. 2.  8 mm density along the septum pellucidum near the foramen of Monro.  This may represent a colloid cyst. 3.  Minimal periventricular white matter disease.  Original Report Authenticated By: Jamesetta Orleans. MATTERN, M.D.     1. Stroke   2. Hyperglycemia       MDM   Pt has multiple co-morbidity that puts her at risk for stroke.  Her sx are suggestive of stroke as is her physical exam.  She is not having chest pain or back pain to suggest aortic distress, but we will obtain a chest Xray to evaluate this.  Other possibilities Include TIA, anemia, arrythmia, infection, or electrolyte abnormalitly.  Presentation is suggestive enough of stroke and with the inability to walk due to the sx she will need admission for further evalution   I personally performed the services described in this documentation, which was scribed in my presence. The recorded information has been reviewed and considered.       Felisa Bonier, MD 10/19/11 [phone]

## 2011-10-19 NOTE — ED Notes (Signed)
Hospitalist in to see pt.

## 2011-10-19 NOTE — ED Notes (Signed)
Family member states pt has been incont. Of bowels which is new

## 2011-10-19 NOTE — ED Notes (Signed)
4 mg Zofran admin on override per MD order.

## 2011-10-19 NOTE — H&P (Signed)
PCP:  Kirstie Peri, MD   DOA:  10/19/2011  3:20 PM  Chief Complaint:  Right sided weakness  HPI: This is a 60 year old female with history of coronary artery disease, combined congestive heart failure, uncontrolled diabetes, hypertension, hyperlipidemia. The patient was brought to the hospital today after she woke up at 3 AM this morning with complaints of right-sided weakness. Patient had felt normal when she went to sleep the night before. She woke up with feelings of numbness on her right arm and leg and also she reports that her extremities felt heavier than normal. She denies any changes in her vision, no difficulty swallowing. Her family reports that earlier she did have some slurring of her speech which has since improved. She denies any chest pain or shortness of breath. She is brought to the emergency room for evaluation and has been referred for further workup of stroke  Allergies: No Known Allergies  Prior to Admission medications   Medication Sig Start Date End Date Taking? Authorizing Provider  acetaminophen (TYLENOL) 500 MG tablet Take 500 mg by mouth every 6 (six) hours as needed.      Historical Provider, MD  albuterol-ipratropium (COMBIVENT) 18-103 MCG/ACT inhaler Inhale 2 puffs into the lungs every 3 (three) hours as needed.     Historical Provider, MD  amLODipine (NORVASC) 5 MG tablet Take 5 mg by mouth daily.  05/16/11   Historical Provider, MD  aspirin 81 MG tablet Take 81 mg by mouth daily.      Historical Provider, MD  clindamycin (CLEOCIN) 150 MG capsule  07/19/11   Historical Provider, MD  clopidogrel (PLAVIX) 75 MG tablet Take 75 mg by mouth daily.      Historical Provider, MD  furosemide (LASIX) 40 MG tablet Take 40 mg by mouth daily.      Historical Provider, MD  insulin glargine (LANTUS) 100 UNIT/ML injection Inject 48 Units into the skin at bedtime. 24u sq in morning    Historical Provider, MD  insulin regular (HUMULIN R,NOVOLIN R) 100 units/mL injection Inject into the  skin as directed. Per sliding scale     Historical Provider, MD  metoCLOPramide (REGLAN) 5 MG tablet Take 5 mg by mouth 4 (four) times daily.      Historical Provider, MD  metoprolol tartrate (LOPRESSOR) 25 MG tablet Take 25 mg by mouth 2 (two) times daily.      Historical Provider, MD  nitroGLYCERIN (NITROSTAT) 0.4 MG SL tablet Place 1 tablet (0.4 mg total) under the tongue every 5 (five) minutes as needed for chest pain. 06/17/11 06/16/12  Luis Abed, MD  ondansetron (ZOFRAN) 4 MG tablet  07/07/11   Historical Provider, MD  pantoprazole (PROTONIX) 40 MG tablet Take 40 mg by mouth daily.      Historical Provider, MD  rosuvastatin (CRESTOR) 40 MG tablet Take 40 mg by mouth daily.      Historical Provider, MD    Past Medical History  Diagnosis Date  . GERD (gastroesophageal reflux disease)   . PAD (peripheral artery disease)     Stent, embolectomy, surgery  /     Dr. Darrick Penna, ABI 0.52 right, 0.54 left, February, 2010  . COPD (chronic obstructive pulmonary disease)     Dr. Craige Cotta, medications, PFTs August, 2012  . OSA (obstructive sleep apnea)     Dr. Craige Cotta  CPAP started August, 2012  . Systolic and diastolic CHF, chronic   . CAD (coronary artery disease)     June, 2012, DES to RCA, staged DES  to circumflex and DES to diagonal with pressure wire to LAD, FFR 0.84  . DM (diabetes mellitus), type 2   . Hypertension   . Morbid obesity   . Hyperlipidemia   . Ejection fraction < 50%     EF 45-50%, echo, June, 2012  . Diastolic dysfunction     grade 2, echo, June, 2012  . Peripheral neuropathy   . Tobacco abuse   . Syncope     vasovagal  . Breast cancer     lumpectomy and radiation 2002  . MI, old     Past Surgical History  Procedure Date  . Appendectomy   . Cholecystectomy   . Tubal ligation   . Breast surgery     Left breast cancer  . Cardiac catheterization 2002  . Popliteal artery angioplasty 11/30/10    Left leg w/ embolectomy, endarterectomy  by Dr.Charles Fields  . Coronary  angioplasty with stent placement     Social History:  reports that she quit smoking about 10 months ago. Her smoking use included Cigarettes. She has a 66 pack-year smoking history. She has never used smokeless tobacco. She reports that she does not drink alcohol or use illicit drugs.  Family History  Problem Relation Age of Onset  . Hypertension Sister   . Hyperlipidemia Sister   . Hyperlipidemia Brother   . Lung cancer Father   . Stomach cancer Sister     Review of Systems:  Constitutional: Denies fever, chills, diaphoresis, appetite change and fatigue.  HEENT: Denies photophobia, eye pain, redness, hearing loss, ear pain, congestion, sore throat, rhinorrhea, sneezing, mouth sores, trouble swallowing, neck pain, neck stiffness and tinnitus.   Respiratory: Denies SOB, DOE, cough, chest tightness,  and wheezing.   Cardiovascular: Denies chest pain, palpitations and leg swelling.  Gastrointestinal: Denies nausea, , abdominal pain, diarrhea, constipation, blood in stool and abdominal distention. Positive for vomiting Genitourinary: Denies dysuria, urgency, frequency, hematuria, flank pain and difficulty urinating.  Musculoskeletal: Denies myalgias, back pain, joint swelling, arthralgias and gait problem.  Skin: Denies pallor, rash and wound.  Neurological: Denies dizziness, seizures, syncope, light-headedness and headaches. Positive for Right sided weakness and numbness Hematological: Denies adenopathy. Easy bruising, personal or family bleeding history  Psychiatric/Behavioral: Denies suicidal ideation, mood changes, confusion, nervousness, sleep disturbance and agitation   Physical Exam:  Filed Vitals:   10/19/11 1510 10/19/11 1511 10/19/11 1638 10/19/11 1743  BP: 146/86   132/73  Pulse: 56   109  Temp: 98.4 F (36.9 C)  98.3 F (36.8 C)   TempSrc: Oral   Oral  Resp: 20   20  Height:  5\' 3"  (1.6 m)    Weight:  103.874 kg (229 lb)    SpO2:    98%    Constitutional: Vital  signs reviewed.  Patient is a well-developed and well-nourished in no acute distress and cooperative with exam. Alert and oriented x3.  Head: Normocephalic and atraumatic Ear: TM normal bilaterally Mouth: no erythema or exudates, MMM Eyes: PERRL, EOMI, conjunctivae normal, No scleral icterus.  Neck: Supple, Trachea midline normal ROM, No JVD, mass, thyromegaly, or carotid bruit present.  Cardiovascular: RRR, S1 normal, S2 normal, no MRG, pulses symmetric and intact bilaterally Pulmonary/Chest: CTAB, no wheezes, rales, or rhonchi Abdominal: Soft. Non-tender, non-distended, bowel sounds are normal, no masses, organomegaly, or guarding present.  GU: no CVA tenderness Musculoskeletal: No joint deformities, erythema, or stiffness, ROM full and no nontender Ext: no edema and no cyanosis, pulses palpable bilaterally (DP and PT)  Hematology: no cervical, inginal, or axillary adenopathy.  Neurological: A&O x3, cranial nerve II-XII are grossly intact, strength 3/5 on right, 5/5 on left Skin: Warm, dry and intact. No rash, cyanosis, or clubbing.  Psychiatric: Normal mood and affect. speech and behavior is normal. Judgment and thought content normal. Cognition and memory are normal.   Labs on Admission:  Results for orders placed during the hospital encounter of 10/19/11 (from the past 48 hour(s))  URINALYSIS, ROUTINE W REFLEX MICROSCOPIC     Status: Abnormal   Collection Time   10/19/11  4:08 PM      Component Value Range Comment   Color, Urine YELLOW  YELLOW     APPearance CLEAR  CLEAR     Specific Gravity, Urine 1.020  1.005 - 1.030     pH 6.5  5.0 - 8.0     Glucose, UA >1000 (*) NEGATIVE (mg/dL)    Hgb urine dipstick MODERATE (*) NEGATIVE     Bilirubin Urine NEGATIVE  NEGATIVE     Ketones, ur NEGATIVE  NEGATIVE (mg/dL)    Protein, ur 960 (*) NEGATIVE (mg/dL)    Urobilinogen, UA 0.2  0.0 - 1.0 (mg/dL)    Nitrite NEGATIVE  NEGATIVE     Leukocytes, UA NEGATIVE  NEGATIVE    URINE MICROSCOPIC-ADD  ON     Status: Abnormal   Collection Time   10/19/11  4:08 PM      Component Value Range Comment   Squamous Epithelial / LPF MANY (*) RARE     WBC, UA 7-10  <3 (WBC/hpf)    RBC / HPF 7-10  <3 (RBC/hpf)    Bacteria, UA FEW (*) RARE    PROTIME-INR     Status: Normal   Collection Time   10/19/11  4:20 PM      Component Value Range Comment   Prothrombin Time 12.8  11.6 - 15.2 (seconds)    INR 0.94  0.00 - 1.49    APTT     Status: Normal   Collection Time   10/19/11  4:20 PM      Component Value Range Comment   aPTT 26  24 - 37 (seconds)   CBC     Status: Abnormal   Collection Time   10/19/11  4:20 PM      Component Value Range Comment   WBC 11.2 (*) 4.0 - 10.5 (K/uL)    RBC 4.20  3.87 - 5.11 (MIL/uL)    Hemoglobin 11.8 (*) 12.0 - 15.0 (g/dL)    HCT 45.4  09.8 - 11.9 (%)    MCV 86.2  78.0 - 100.0 (fL)    MCH 28.1  26.0 - 34.0 (pg)    MCHC 32.6  30.0 - 36.0 (g/dL)    RDW 14.7  82.9 - 56.2 (%)    Platelets 267  150 - 400 (K/uL)   DIFFERENTIAL     Status: Abnormal   Collection Time   10/19/11  4:20 PM      Component Value Range Comment   Neutrophils Relative 83 (*) 43 - 77 (%)    Neutro Abs 9.2 (*) 1.7 - 7.7 (K/uL)    Lymphocytes Relative 10 (*) 12 - 46 (%)    Lymphs Abs 1.1  0.7 - 4.0 (K/uL)    Monocytes Relative 6  3 - 12 (%)    Monocytes Absolute 0.7  0.1 - 1.0 (K/uL)    Eosinophils Relative 1  0 - 5 (%)    Eosinophils Absolute 0.1  0.0 - 0.7 (K/uL)    Basophils Relative 0  0 - 1 (%)    Basophils Absolute 0.0  0.0 - 0.1 (K/uL)   COMPREHENSIVE METABOLIC PANEL     Status: Abnormal   Collection Time   10/19/11  4:20 PM      Component Value Range Comment   Sodium 137  135 - 145 (mEq/L)    Potassium 3.4 (*) 3.5 - 5.1 (mEq/L)    Chloride 99  96 - 112 (mEq/L)    CO2 27  19 - 32 (mEq/L)    Glucose, Bld 417 (*) 70 - 99 (mg/dL)    BUN 13  6 - 23 (mg/dL)    Creatinine, Ser 1.61  0.50 - 1.10 (mg/dL)    Calcium 9.2  8.4 - 10.5 (mg/dL)    Total Protein 6.8  6.0 - 8.3 (g/dL)     Albumin 2.7 (*) 3.5 - 5.2 (g/dL)    AST 14  0 - 37 (U/L)    ALT 15  0 - 35 (U/L)    Alkaline Phosphatase 150 (*) 39 - 117 (U/L)    Total Bilirubin 0.8  0.3 - 1.2 (mg/dL)    GFR calc non Af Amer >90  >90 (mL/min)    GFR calc Af Amer >90  >90 (mL/min)   CK TOTAL AND CKMB     Status: Abnormal   Collection Time   10/19/11  4:20 PM      Component Value Range Comment   Total CK 77  7 - 177 (U/L)    CK, MB 4.2 (*) 0.3 - 4.0 (ng/mL)    Relative Index RELATIVE INDEX IS INVALID  0.0 - 2.5    TROPONIN I     Status: Normal   Collection Time   10/19/11  4:20 PM      Component Value Range Comment   Troponin I <0.30  <0.30 (ng/mL)   ETHANOL     Status: Normal   Collection Time   10/19/11  4:20 PM      Component Value Range Comment   Alcohol, Ethyl (B) <11  0 - 11 (mg/dL)   GLUCOSE, CAPILLARY     Status: Abnormal   Collection Time   10/19/11  4:57 PM      Component Value Range Comment   Glucose-Capillary 406 (*) 70 - 99 (mg/dL)     Radiological Exams on Admission: No results found.  Assessment/Plan 1.CVA:  Patient's acute right-sided weakness is likely secondary to an underlying stroke. She will be admitted to a telemetry bed for further workup per stroke orders set. We will obtain carotid Dopplers, 2-D echo, MRI and MRA of the brain. Patient is already on aspirin and Plavix. We will increase her baby aspirin to full dose aspirin. She has had cardiac stents within the past year therefore we will not change her Plavix  2. Uncontrolled type 2 diabetes. Patient will be continued on her outpatient dose of Lantus we will cover her with sliding scale insulin. We will continue on current modified diet. Follow her blood sugars  3. Chronic combined congestive heart failure. This appears compensated at this time. We will continue her outpatient medication.  4. Obstructive sleep apnea. Patient is intolerant of CPAP.  5. Patient is a DO NOT RESUSCITATE this was confirmed with her, further orders per the  clinical course    Time Spent on Admission:  MEMON,JEHANZEB 10/19/2011, 6:58 PM

## 2011-10-20 ENCOUNTER — Inpatient Hospital Stay (HOSPITAL_COMMUNITY): Payer: Medicare Other

## 2011-10-20 LAB — LIPID PANEL
Cholesterol: 175 mg/dL (ref 0–200)
HDL: 32 mg/dL — ABNORMAL LOW (ref >39)
LDL Cholesterol: 104 mg/dL — ABNORMAL HIGH (ref 0–99)
Total CHOL/HDL Ratio: 5.5 RATIO
Triglycerides: 195 mg/dL — ABNORMAL HIGH (ref <150)
VLDL: 39 mg/dL (ref 0–40)

## 2011-10-20 LAB — GLUCOSE, CAPILLARY

## 2011-10-20 MED ORDER — INSULIN GLARGINE 100 UNIT/ML ~~LOC~~ SOLN
55.0000 [IU] | Freq: Every day | SUBCUTANEOUS | Status: DC
  Administered 2011-10-20: 55 [IU] via SUBCUTANEOUS

## 2011-10-20 NOTE — Progress Notes (Signed)
Subjective: No events overnight. Patient denies chest pain, shortness of breath, abdominal pain. Had bowel movement and reports ambulating.  Objective:  Vital signs in last 24 hours:  Filed Vitals:   10/20/11 0415 10/20/11 0528 10/20/11 0601 10/20/11 1400  BP: 111/66  134/83 125/73  Pulse: 81  91 91  Temp: 98.3 F (36.8 C)  98.6 F (37 C) 98.5 F (36.9 C)  TempSrc: Oral  Oral Oral  Resp: 18  20 20   Height:      Weight:      SpO2:  92% 92% 93%    Intake/Output from previous day:   Intake/Output Summary (Last 24 hours) at 10/20/11 1518 Last data filed at 10/20/11 1200  Gross per 24 hour  Intake    480 ml  Output      0 ml  Net    480 ml    Physical Exam: General: Alert, awake, oriented x3, in no acute distress. HEENT: No bruits, no goiter. Moist mucous membranes, no scleral icterus, no conjunctival pallor. Heart: Regular rate and rhythm, without murmurs, rubs, gallops. Lungs: Clear to auscultation bilaterally. No wheezing, no rhonchi, no rales.  Abdomen: Soft, nontender, nondistended, positive bowel sounds. Extremities: No clubbing cyanosis or edema,  positive pedal pulses. Neuro: Grossly intact, nonfocal.    Lab Results:  Basic Metabolic Panel:    Component Value Date/Time   NA 137 10/19/2011 1620   K 3.4* 10/19/2011 1620   CL 99 10/19/2011 1620   CO2 27 10/19/2011 1620   BUN 13 10/19/2011 1620   CREATININE 0.73 10/19/2011 1620   GLUCOSE 417* 10/19/2011 1620   CALCIUM 9.2 10/19/2011 1620   CBC:    Component Value Date/Time   WBC 11.2* 10/19/2011 1620   HGB 11.8* 10/19/2011 1620   HCT 36.2 10/19/2011 1620   PLT 267 10/19/2011 1620   MCV 86.2 10/19/2011 1620   NEUTROABS 9.2* 10/19/2011 1620   LYMPHSABS 1.1 10/19/2011 1620   MONOABS 0.7 10/19/2011 1620   EOSABS 0.1 10/19/2011 1620   BASOSABS 0.0 10/19/2011 1620      Lab 10/19/11 1620  WBC 11.2*  HGB 11.8*  HCT 36.2  PLT 267  MCV 86.2  MCH 28.1  MCHC 32.6  RDW 13.0  LYMPHSABS 1.1  MONOABS 0.7  EOSABS 0.1   BASOSABS 0.0  BANDABS --    Lab 10/19/11 1620  NA 137  K 3.4*  CL 99  CO2 27  GLUCOSE 417*  BUN 13  CREATININE 0.73  CALCIUM 9.2  MG --    Lab 10/19/11 1620  INR 0.94  PROTIME --   Cardiac markers:  Lab 10/19/11 1620  CKMB 4.2*  TROPONINI <0.30  MYOGLOBIN --   No results found for this basename: POCBNP:3 in the last 168 hours No results found for this or any previous visit (from the past 240 hour(s)).  Studies/Results: Dg Chest 2 View  10/19/2011  *RADIOLOGY REPORT*  Clinical Data: Right hemiparesis.  Question mediastinal widening.  CHEST - 2 VIEW  Comparison: PA and lateral chest 08/22/2005 and single view of the chest 05/07/2011.  Findings: There is cardiomegaly.  Cardiac and mediastinal contours are unchanged.  Pulmonary edema seen on the most recent prior study has resolved.  No pleural effusion. Surgical clips left axilla noted in  IMPRESSION: Cardiomegaly without acute disease.  Unchanged appearance of the cardiomediastinal silhouette.  Original Report Authenticated By: Bernadene Bell. Maricela Curet, M.D.   Ct Head Wo Contrast  10/19/2011  *RADIOLOGY REPORT*  Clinical Data: Upper lower  extremity weakness.  CT HEAD WITHOUT CONTRAST  Technique:  Contiguous axial images were obtained from the base of the skull through the vertex without contrast.  Comparison: None.  Findings: A focal area of hypoattenuation is present along the posterior limb of the left external capsule.  This could account for the patient's right-sided symptoms.  Minimal periventricular white matter hypoattenuation is present otherwise.  There may be some involvement of the lateral left thalamus.  No other acute cortical infarct is present. An 8 mm hypodense lesion is present along the septum placement, near the foramen of Monro.  This may represent a colloid cyst.  There is no other hemorrhage or mass lesion.  No significant extra-axial fluid collection is present.  The paranasal sinuses and mastoid air cells are  clear.  The osseous skull is intact.  IMPRESSION:  1.  An ill-defined 10 mm area of hypoattenuation involves the posterior limb of the left internal capsule and lateral thalamus. This may represent an age indeterminate infarct.  Acute infarction is not excluded. 2.  8 mm density along the septum pellucidum near the foramen of Monro.  This may represent a colloid cyst. 3.  Minimal periventricular white matter disease.  Original Report Authenticated By: Jamesetta Orleans. MATTERN, M.D.   US Carotid Duplex Bilateral  10/20/2011  *RADIOLOGY REPORT*  Clinical Data: Stroke  BILATERAL CAROTID DUPLEX ULTRASOUND  Technique: Gray scale imaging, color Doppler and duplex ultrasound was performed of bilateral carotid and vertebral arteries in the neck.  Comparison:  None.  Criteria:  Quantification of carotid stenosis is based on velocity parameters that correlate the residual internal carotid diameter with NASCET-based stenosis levels, using the diameter of the distal internal carotid lumen as the denominator for stenosis measurement.  The following velocity measurements were obtained:                   PEAK SYSTOLIC/END DIASTOLIC RIGHT ICA:                        102cm/sec CCA:                        78cm/sec SYSTOLIC ICA/CCA RATIO:     1.31 DIASTOLIC ICA/CCA RATIO: ECA:                        75cm/sec  LEFT ICA:                        96cm/sec CCA:                        83cm/sec SYSTOLIC ICA/CCA RATIO:     1.15 DIASTOLIC ICA/CCA RATIO: ECA:                        101cm/sec  Findings:  RIGHT CAROTID ARTERY: Mild plaque in the bulb.  RIGHT VERTEBRAL ARTERY:  Antegrade.  LEFT CAROTID ARTERY: Mild plaque in the bulb.  LEFT VERTEBRAL ARTERY:  Antegrade.  IMPRESSION: Less than 50% stenosis in the right and left internal carotid arteries.  Original Report Authenticated By: Donavan Burnet, M.D.    Medications: Scheduled Meds:   . amLODipine  5 mg Oral Daily  . aspirin EC  325 mg Oral Daily  . clopidogrel  75 mg Oral Daily  .  enoxaparin  40 mg Subcutaneous Q24H  . furosemide  40 mg Oral Daily  . insulin aspart  0-20 Units Subcutaneous TID WC  . insulin aspart  0-5 Units Subcutaneous QHS  . insulin aspart  10 Units Subcutaneous Once  . insulin glargine  48 Units Subcutaneous QHS  . metoCLOPramide  5 mg Oral QID  . metoprolol tartrate  25 mg Oral BID  . pantoprazole  40 mg Oral Daily  . rosuvastatin  40 mg Oral Daily   Continuous Infusions:   . DISCONTD: sodium chloride 125 mL/hr at 10/19/11 1634   PRN Meds:.acetaminophen, acetaminophen, albuterol-ipratropium, nitroGLYCERIN, ondansetron (ZOFRAN) IV, senna-docusate  Assessment/Plan:  Principal Problem:  *CVA (cerebral vascular accident) Active Problems:  COPD (chronic obstructive pulmonary disease)  Systolic and diastolic CHF, chronic  CAD (coronary artery disease)  Hypertension  Hyperlipidemia  Hemiparesis, acute  DM type 2 (diabetes mellitus, type 2)  Hypokalemia  Plan: Stroke work up underway, carotid dopplers negative, will ask neurology to see in am for recommendations on the antiplatelet regimen Increase lantus as her fasting blood glucose is high PT to evaluate for disposition.   LOS: 1 day   Keinan Brouillet 10/20/2011, 3:18 PM

## 2011-10-21 ENCOUNTER — Inpatient Hospital Stay (HOSPITAL_COMMUNITY): Payer: Medicare Other

## 2011-10-21 ENCOUNTER — Encounter: Payer: Self-pay | Admitting: Pulmonary Disease

## 2011-10-21 DIAGNOSIS — I059 Rheumatic mitral valve disease, unspecified: Secondary | ICD-10-CM

## 2011-10-21 LAB — CBC
HCT: 33.8 % — ABNORMAL LOW (ref 36.0–46.0)
Hemoglobin: 11.2 g/dL — ABNORMAL LOW (ref 12.0–15.0)
MCH: 29.1 pg (ref 26.0–34.0)
MCHC: 33.1 g/dL (ref 30.0–36.0)
MCV: 87.8 fL (ref 78.0–100.0)
Platelets: 240 10*3/uL (ref 150–400)
RBC: 3.85 MIL/uL — ABNORMAL LOW (ref 3.87–5.11)
RDW: 13 % (ref 11.5–15.5)
WBC: 7.8 10*3/uL (ref 4.0–10.5)

## 2011-10-21 LAB — BASIC METABOLIC PANEL WITH GFR
BUN: 16 mg/dL (ref 6–23)
CO2: 28 meq/L (ref 19–32)
Calcium: 8.2 mg/dL — ABNORMAL LOW (ref 8.4–10.5)
Chloride: 103 meq/L (ref 96–112)
Creatinine, Ser: 0.9 mg/dL (ref 0.50–1.10)
GFR calc Af Amer: 79 mL/min — ABNORMAL LOW
GFR calc non Af Amer: 68 mL/min — ABNORMAL LOW
Glucose, Bld: 369 mg/dL — ABNORMAL HIGH (ref 70–99)
Potassium: 3.4 meq/L — ABNORMAL LOW (ref 3.5–5.1)
Sodium: 138 meq/L (ref 135–145)

## 2011-10-21 LAB — GLUCOSE, CAPILLARY
Glucose-Capillary: 358 mg/dL — ABNORMAL HIGH (ref 70–99)
Glucose-Capillary: 286 mg/dL — ABNORMAL HIGH (ref 70–99)
Glucose-Capillary: 286 mg/dL — ABNORMAL HIGH (ref 70–99)

## 2011-10-21 IMAGING — CR DG ABDOMEN ACUTE W/ 1V CHEST
3 series · 3 of 3 positions shown · non-contrast
Comparison: CT 01/05/2009

CLINICAL DATA: Nausea, vomiting.

ACUTE ABDOMEN SERIES (ABDOMEN 2 VIEW & CHEST 1 VIEW)

[view not recorded (1 of 3)]
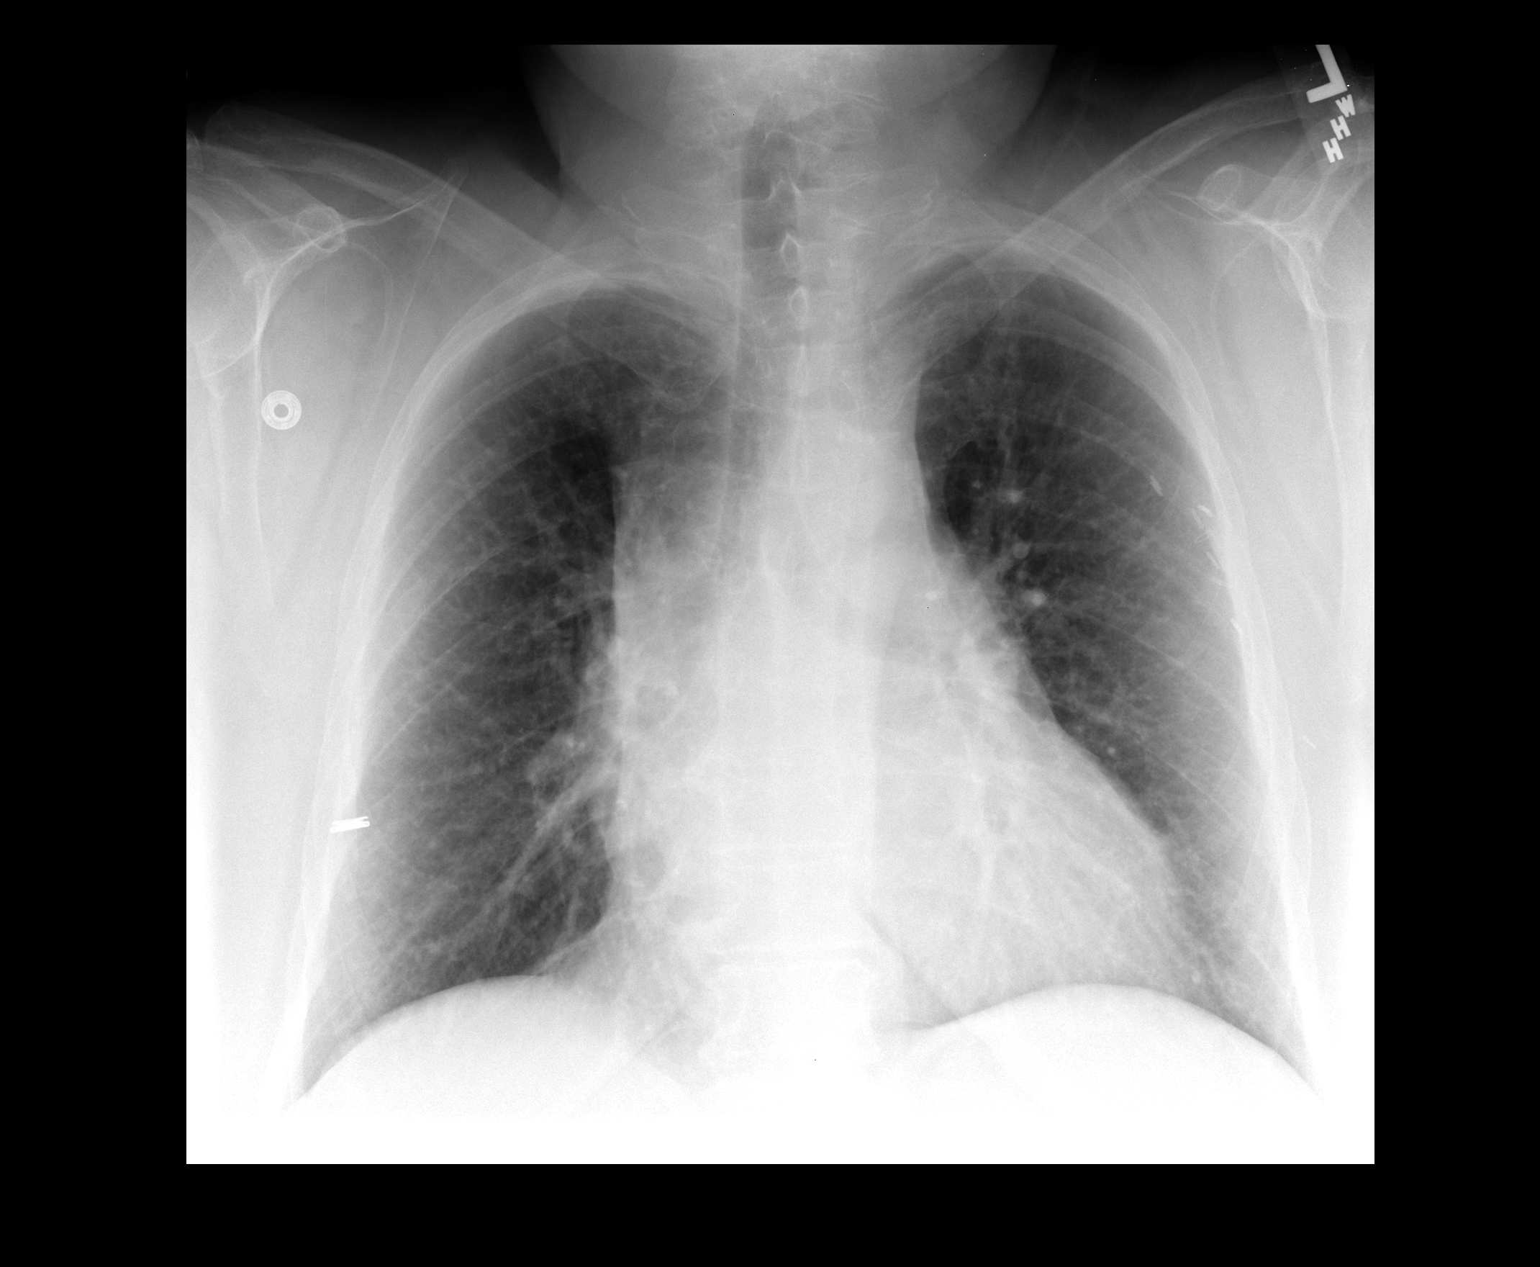

[view not recorded (2 of 3)]
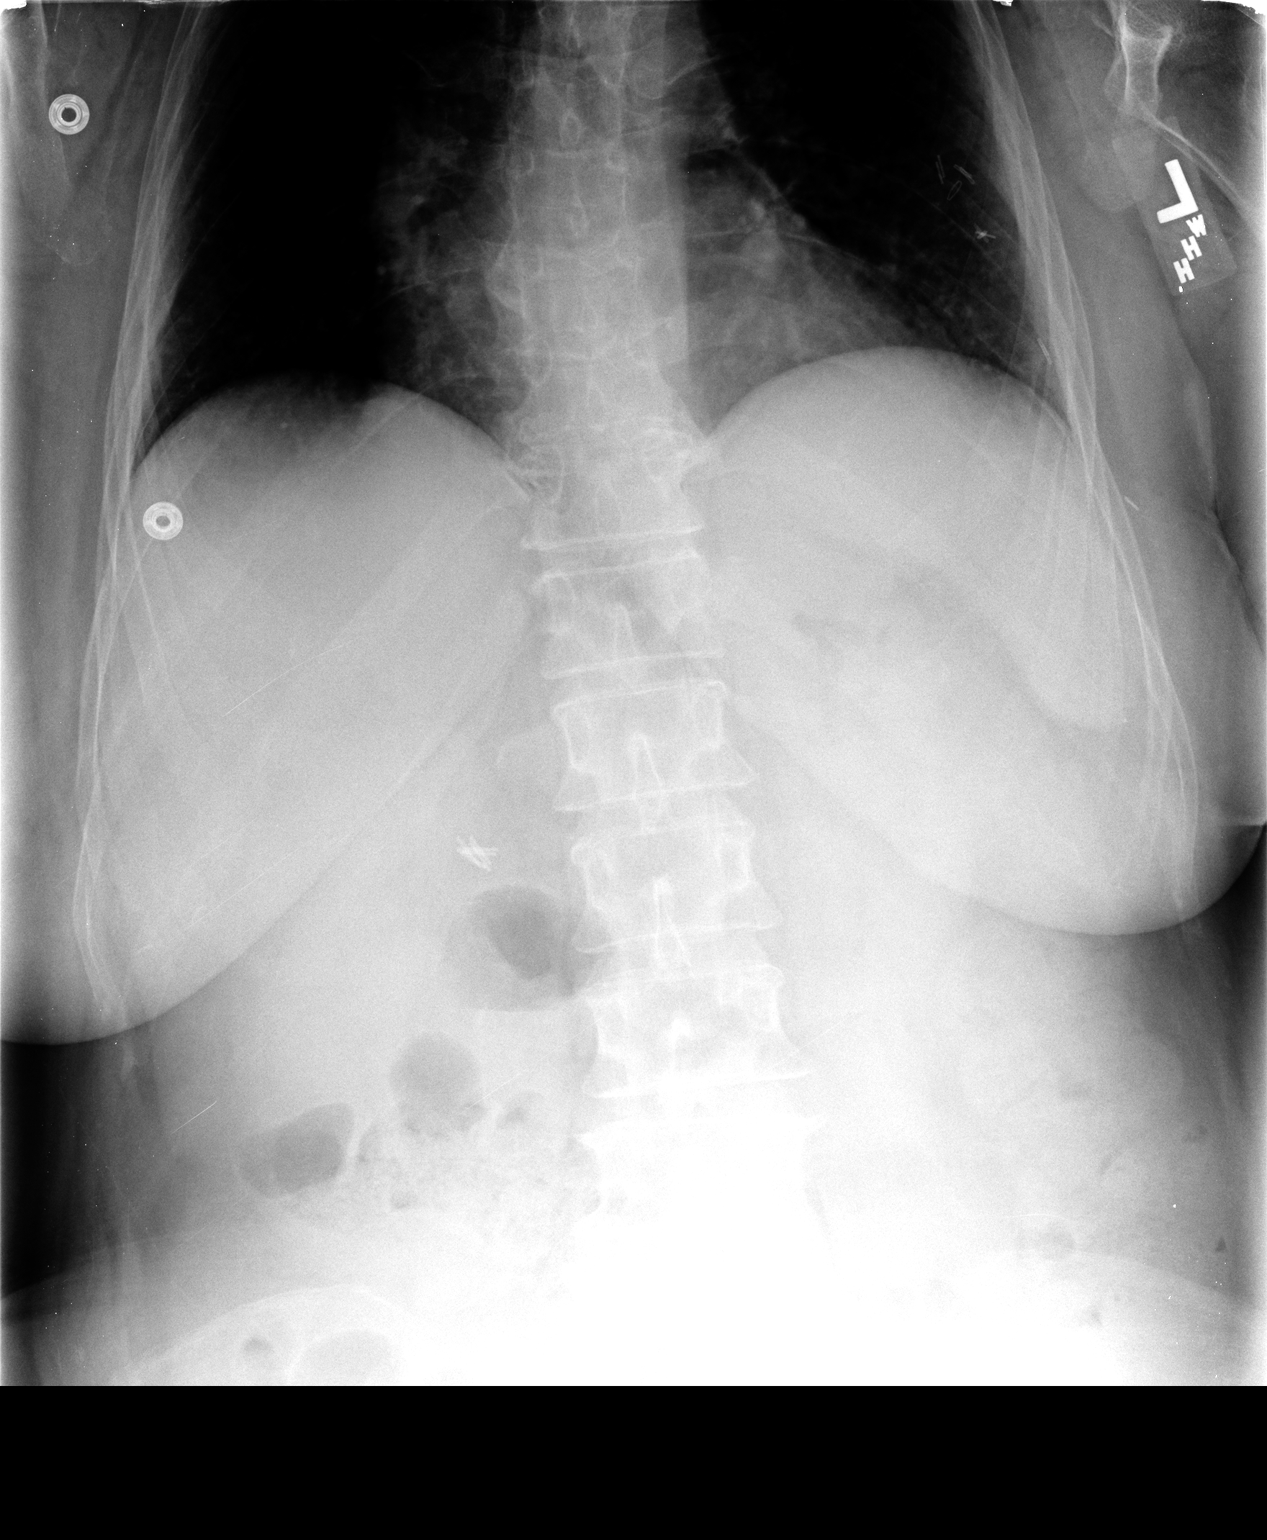

[view not recorded (3 of 3)]
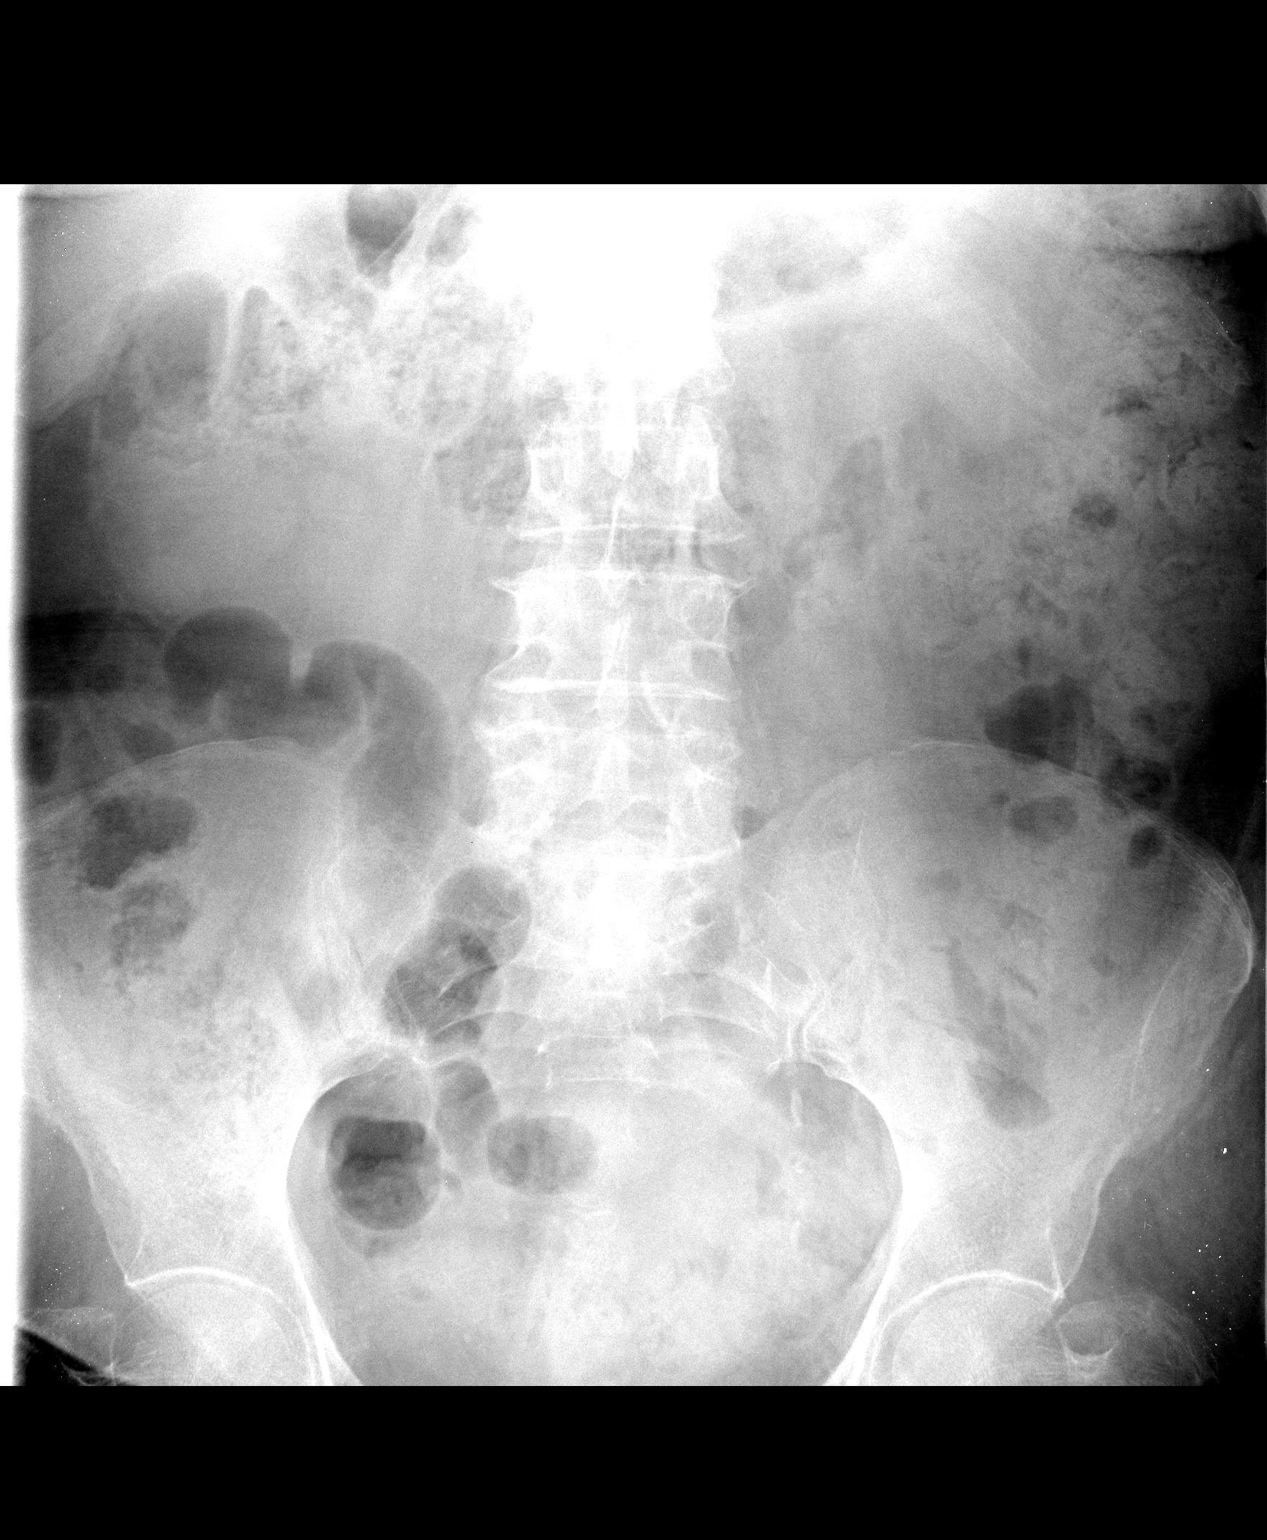

[3 of 3 positions shown; findings below may reference images not displayed]

FINDINGS: Mild cardiomegaly.  Lungs are clear.  No effusions or
acute bony abnormality.

Prior cholecystectomy.  Moderate stool throughout the colon.  No
obstruction or free air.  No organomegaly or suspicious
calcification.  No acute bony abnormality.
IMPRESSION: No obstruction or free air.

Moderate stool burden.

Cardiomegaly.

## 2011-10-21 MED ORDER — INSULIN ASPART 100 UNIT/ML ~~LOC~~ SOLN
6.0000 [IU] | Freq: Three times a day (TID) | SUBCUTANEOUS | Status: DC
  Administered 2011-10-21 – 2011-10-23 (×6): 6 [IU] via SUBCUTANEOUS

## 2011-10-21 MED ORDER — INSULIN GLARGINE 100 UNIT/ML ~~LOC~~ SOLN
65.0000 [IU] | Freq: Every day | SUBCUTANEOUS | Status: DC
  Administered 2011-10-21 – 2011-10-22 (×2): 65 [IU] via SUBCUTANEOUS

## 2011-10-21 NOTE — Progress Notes (Signed)
*  PRELIMINARY RESULTS* Echocardiogram 2D Echocardiogram has been performed.  [name] 10/21/2011, 2:05 PM

## 2011-10-21 NOTE — Progress Notes (Signed)
Subjective: Has persistent Right leg numbness. No new complaints  Objective:  Vital signs in last 24 hours:  Filed Vitals:   10/20/11 1800 10/20/11 2125 10/21/11 0234 10/21/11 0611  BP: 124/68 130/68 126/77 165/85  Pulse: 92 102 93 96  Temp: 98.2 F (36.8 C) 99 F (37.2 C) 98 F (36.7 C) 97.9 F (36.6 C)  TempSrc: Oral Oral Oral Oral  Resp: 20 18 16 20   Height:      Weight:      SpO2: 93% 93% 91% 96%    Intake/Output from previous day:   Intake/Output Summary (Last 24 hours) at 10/21/11 0950 Last data filed at 10/21/11 0857  Gross per 24 hour  Intake    660 ml  Output      0 ml  Net    660 ml    Physical Exam: General: Alert, awake, oriented x3, in no acute distress. HEENT: No bruits, no goiter. Moist mucous membranes, no scleral icterus, no conjunctival pallor. Heart: Regular rate and rhythm, without murmurs, rubs, gallops. Lungs: Clear to auscultation bilaterally. No wheezing, no rhonchi, no rales.  Abdomen: Soft, nontender, nondistended, positive bowel sounds. Extremities: No clubbing cyanosis or edema,  positive pedal pulses. Neuro: Grossly intact, nonfocal.    Lab Results:  Basic Metabolic Panel:    Component Value Date/Time   NA 138 10/21/2011 0515   K 3.4* 10/21/2011 0515   CL 103 10/21/2011 0515   CO2 28 10/21/2011 0515   BUN 16 10/21/2011 0515   CREATININE 0.90 10/21/2011 0515   GLUCOSE 369* 10/21/2011 0515   CALCIUM 8.2* 10/21/2011 0515   CBC:    Component Value Date/Time   WBC 7.8 10/21/2011 0515   HGB 11.2* 10/21/2011 0515   HCT 33.8* 10/21/2011 0515   PLT 240 10/21/2011 0515   MCV 87.8 10/21/2011 0515   NEUTROABS 9.2* 10/19/2011 1620   LYMPHSABS 1.1 10/19/2011 1620   MONOABS 0.7 10/19/2011 1620   EOSABS 0.1 10/19/2011 1620   BASOSABS 0.0 10/19/2011 1620      Lab 10/21/11 0515 10/19/11 1620  WBC 7.8 11.2*  HGB 11.2* 11.8*  HCT 33.8* 36.2  PLT 240 267  MCV 87.8 86.2  MCH 29.1 28.1  MCHC 33.1 32.6  RDW 13.0 13.0  LYMPHSABS --  1.1  MONOABS -- 0.7  EOSABS -- 0.1  BASOSABS -- 0.0  BANDABS -- --    Lab 10/21/11 0515 10/19/11 1620  NA 138 137  K 3.4* 3.4*  CL 103 99  CO2 28 27  GLUCOSE 369* 417*  BUN 16 13  CREATININE 0.90 0.73  CALCIUM 8.2* 9.2  MG -- --    Lab 10/19/11 1620  INR 0.94  PROTIME --   Cardiac markers:  Lab 10/19/11 1620  CKMB 4.2*  TROPONINI <0.30  MYOGLOBIN --   No results found for this basename: POCBNP:3 in the last 168 hours No results found for this or any previous visit (from the past 240 hour(s)).  Studies/Results: Dg Chest 2 View  10/19/2011  *RADIOLOGY REPORT*  Clinical Data: Right hemiparesis.  Question mediastinal widening.  CHEST - 2 VIEW  Comparison: PA and lateral chest 08/22/2005 and single view of the chest 05/07/2011.  Findings: There is cardiomegaly.  Cardiac and mediastinal contours are unchanged.  Pulmonary edema seen on the most recent prior study has resolved.  No pleural effusion. Surgical clips left axilla noted in  IMPRESSION: Cardiomegaly without acute disease.  Unchanged appearance of the cardiomediastinal silhouette.  Original Report Authenticated By: Maisie Fus  L. Maricela Curet, M.D.   Ct Head Wo Contrast  10/19/2011  *RADIOLOGY REPORT*  Clinical Data: Upper lower extremity weakness.  CT HEAD WITHOUT CONTRAST  Technique:  Contiguous axial images were obtained from the base of the skull through the vertex without contrast.  Comparison: None.  Findings: A focal area of hypoattenuation is present along the posterior limb of the left external capsule.  This could account for the patient's right-sided symptoms.  Minimal periventricular white matter hypoattenuation is present otherwise.  There may be some involvement of the lateral left thalamus.  No other acute cortical infarct is present. An 8 mm hypodense lesion is present along the septum placement, near the foramen of Monro.  This may represent a colloid cyst.  There is no other hemorrhage or mass lesion.  No significant  extra-axial fluid collection is present.  The paranasal sinuses and mastoid air cells are clear.  The osseous skull is intact.  IMPRESSION:  1.  An ill-defined 10 mm area of hypoattenuation involves the posterior limb of the left internal capsule and lateral thalamus. This may represent an age indeterminate infarct.  Acute infarction is not excluded. 2.  8 mm density along the septum pellucidum near the foramen of Monro.  This may represent a colloid cyst. 3.  Minimal periventricular white matter disease.  Original Report Authenticated By: Jamesetta Orleans. MATTERN, M.D.   US Carotid Duplex Bilateral  10/20/2011  *RADIOLOGY REPORT*  Clinical Data: Stroke  BILATERAL CAROTID DUPLEX ULTRASOUND  Technique: Gray scale imaging, color Doppler and duplex ultrasound was performed of bilateral carotid and vertebral arteries in the neck.  Comparison:  None.  Criteria:  Quantification of carotid stenosis is based on velocity parameters that correlate the residual internal carotid diameter with NASCET-based stenosis levels, using the diameter of the distal internal carotid lumen as the denominator for stenosis measurement.  The following velocity measurements were obtained:                   PEAK SYSTOLIC/END DIASTOLIC RIGHT ICA:                        102cm/sec CCA:                        78cm/sec SYSTOLIC ICA/CCA RATIO:     1.31 DIASTOLIC ICA/CCA RATIO: ECA:                        75cm/sec  LEFT ICA:                        96cm/sec CCA:                        83cm/sec SYSTOLIC ICA/CCA RATIO:     1.15 DIASTOLIC ICA/CCA RATIO: ECA:                        101cm/sec  Findings:  RIGHT CAROTID ARTERY: Mild plaque in the bulb.  RIGHT VERTEBRAL ARTERY:  Antegrade.  LEFT CAROTID ARTERY: Mild plaque in the bulb.  LEFT VERTEBRAL ARTERY:  Antegrade.  IMPRESSION: Less than 50% stenosis in the right and left internal carotid arteries.  Original Report Authenticated By: Donavan Burnet, M.D.    Medications: Scheduled Meds:   .  amLODipine  5 mg Oral Daily  . aspirin EC  325 mg Oral Daily  .  clopidogrel  75 mg Oral Daily  . enoxaparin  40 mg Subcutaneous Q24H  . furosemide  40 mg Oral Daily  . insulin aspart  0-20 Units Subcutaneous TID WC  . insulin aspart  0-5 Units Subcutaneous QHS  . insulin glargine  55 Units Subcutaneous QHS  . metoCLOPramide  5 mg Oral QID  . metoprolol tartrate  25 mg Oral BID  . pantoprazole  40 mg Oral Daily  . rosuvastatin  40 mg Oral Daily  . DISCONTD: insulin glargine  48 Units Subcutaneous QHS   Continuous Infusions:  PRN Meds:.acetaminophen, acetaminophen, albuterol-ipratropium, nitroGLYCERIN, ondansetron (ZOFRAN) IV, senna-docusate  Assessment/Plan:  Principal Problem:  *CVA (cerebral vascular accident) Active Problems:  COPD (chronic obstructive pulmonary disease)  Systolic and diastolic CHF, chronic  CAD (coronary artery disease)  Hypertension  Hyperlipidemia  Hemiparesis, acute  DM type 2 (diabetes mellitus, type 2)  Hypokalemia  Plan:  MRI to be done today, physical therapy to see Appreciate Neurology assistance Blood sugars still, high, will add meal coverage   LOS: 2 days   Tatiana Courter 10/21/2011, 9:50 AM

## 2011-10-21 NOTE — Progress Notes (Signed)
Inpatient Diabetes Program Recommendations  AACE/ADA: New Consensus Statement on Inpatient Glycemic Control (2009)  Target Ranges:  Prepandial:   less than 140 mg/dL      Peak postprandial:   less than 180 mg/dL (1-2 hours)      Critically ill patients:  140 - 180 mg/dL   Reason for Visit: Elevated glucose: 369 mg/dL  Inpatient Diabetes Program Recommendations Insulin - Basal: Increase Lantus to 65 units qhs Insulin - Meal Coverage: Add Novolog 4 units TID Outpatient Referral: HgbA1C 12.3%:  Patient would benefit from outpatient diabetes education  Note: RD consult for diet education

## 2011-10-21 NOTE — Plan of Care (Signed)
Problem: Food- and Nutrition-Related Knowledge Deficit (NB-1.1) Goal: Nutrition education Formal process to instruct or train a patient/client in a skill or to impart knowledge to help patients/clients voluntarily manage or modify food choices and eating behavior to maintain or improve health. Outcome: Completed/Met Date Met:  10/21/11 Provided pt with Basic Skills overview Diabetic Diet. Handouts included: CHO food list, label reading, meal planning. Portion control emphasized. Meal ideas handout also included. Anticipate good adherence. Agree with referral for outpatient RD follow-up.

## 2011-10-21 NOTE — Consult Note (Signed)
Reason for Consult: Referring Physician:   [name] is an 60 y.o. female.  HPI:   Past Medical History  Diagnosis Date  . GERD (gastroesophageal reflux disease)   . PAD (peripheral artery disease)     Stent, embolectomy, surgery  /     Dr. Darrick Penna, ABI 0.52 right, 0.54 left, February, 2010  . COPD (chronic obstructive pulmonary disease)     Dr. Craige Cotta, medications, PFTs August, 2012  . OSA (obstructive sleep apnea)     Dr. Craige Cotta  CPAP started August, 2012  . Systolic and diastolic CHF, chronic   . CAD (coronary artery disease)     June, 2012, DES to RCA, staged DES to circumflex and DES to diagonal with pressure wire to LAD, FFR 0.84  . DM (diabetes mellitus), type 2   . Hypertension   . Morbid obesity   . Hyperlipidemia   . Ejection fraction < 50%     EF 45-50%, echo, June, 2012  . Diastolic dysfunction     grade 2, echo, June, 2012  . Peripheral neuropathy   . Tobacco abuse   . Syncope     vasovagal  . Breast cancer     lumpectomy and radiation 2002  . MI, old     Past Surgical History  Procedure Date  . Appendectomy   . Cholecystectomy   . Tubal ligation   . Breast surgery     Left breast cancer  . Cardiac catheterization 2002  . Popliteal artery angioplasty 11/30/10    Left leg w/ embolectomy, endarterectomy  by Dr.Charles Fields  . Coronary angioplasty with stent placement     Family History  Problem Relation Age of Onset  . Hypertension Sister   . Hyperlipidemia Sister   . Hyperlipidemia Brother   . Lung cancer Father   . Stomach cancer Sister     Social History:  reports that she quit smoking about 10 months ago. Her smoking use included Cigarettes. She has a 66 pack-year smoking history. She has never used smokeless tobacco. She reports that she does not drink alcohol or use illicit drugs.  Allergies: No Known Allergies  Medications:  Prior to Admission medications   Medication Sig Start Date End Date Taking? Authorizing Provider    albuterol-ipratropium (COMBIVENT) 18-103 MCG/ACT inhaler Inhale 2 puffs into the lungs every 3 (three) hours as needed.    Yes Historical Provider, MD  amLODipine (NORVASC) 5 MG tablet Take 5 mg by mouth daily.  05/16/11  Yes Historical Provider, MD  aspirin 81 MG tablet Take 81 mg by mouth daily.     Yes Historical Provider, MD  clindamycin (CLEOCIN) 150 MG capsule Take 150 mg by mouth daily as needed. When patient feels that cellulitis in foot is going to flare up.    Yes Historical Provider, MD  clopidogrel (PLAVIX) 75 MG tablet Take 75 mg by mouth daily.     Yes Historical Provider, MD  furosemide (LASIX) 40 MG tablet Take 40 mg by mouth daily.     Yes Historical Provider, MD  insulin glargine (LANTUS) 100 UNIT/ML injection Inject 48 Units into the skin daily.     Yes Historical Provider, MD  insulin regular (HUMULIN R,NOVOLIN R) 100 units/mL injection Inject into the skin as directed. Per sliding scale   Yes Historical Provider, MD  metoCLOPramide (REGLAN) 5 MG tablet Take 5 mg by mouth 4 (four) times daily.     Yes Historical Provider, MD  metoprolol tartrate (LOPRESSOR) 25 MG tablet  Take 25 mg by mouth 2 (two) times daily.     Yes Historical Provider, MD  nitroGLYCERIN (NITROSTAT) 0.4 MG SL tablet Place 1 tablet (0.4 mg total) under the tongue every 5 (five) minutes as needed for chest pain. 06/17/11 06/16/12 Yes Luis Abed, MD  ondansetron (ZOFRAN) 4 MG tablet Take 4 mg by mouth every 8 (eight) hours as needed. Nausea    Yes Historical Provider, MD  pantoprazole (PROTONIX) 40 MG tablet Take 40 mg by mouth daily.     Yes Historical Provider, MD  rosuvastatin (CRESTOR) 40 MG tablet Take 40 mg by mouth daily.     Yes Historical Provider, MD   Scheduled Meds:   . amLODipine  5 mg Oral Daily  . aspirin EC  325 mg Oral Daily  . clopidogrel  75 mg Oral Daily  . enoxaparin  40 mg Subcutaneous Q24H  . furosemide  40 mg Oral Daily  . insulin aspart  0-20 Units Subcutaneous TID WC  . insulin  aspart  0-5 Units Subcutaneous QHS  . insulin glargine  55 Units Subcutaneous QHS  . metoCLOPramide  5 mg Oral QID  . metoprolol tartrate  25 mg Oral BID  . pantoprazole  40 mg Oral Daily  . rosuvastatin  40 mg Oral Daily  . DISCONTD: insulin glargine  48 Units Subcutaneous QHS   Continuous Infusions:  PRN Meds:.acetaminophen, acetaminophen, albuterol-ipratropium, nitroGLYCERIN, ondansetron (ZOFRAN) IV, senna-docusate   Results for orders placed during the hospital encounter of 10/19/11 (from the past 48 hour(s))  URINALYSIS, ROUTINE W REFLEX MICROSCOPIC     Status: Abnormal   Collection Time   10/19/11  4:08 PM      Component Value Range Comment   Color, Urine YELLOW  YELLOW     APPearance CLEAR  CLEAR     Specific Gravity, Urine 1.020  1.005 - 1.030     pH 6.5  5.0 - 8.0     Glucose, UA >1000 (*) NEGATIVE (mg/dL)    Hgb urine dipstick MODERATE (*) NEGATIVE     Bilirubin Urine NEGATIVE  NEGATIVE     Ketones, ur NEGATIVE  NEGATIVE (mg/dL)    Protein, ur 960 (*) NEGATIVE (mg/dL)    Urobilinogen, UA 0.2  0.0 - 1.0 (mg/dL)    Nitrite NEGATIVE  NEGATIVE     Leukocytes, UA NEGATIVE  NEGATIVE    URINE MICROSCOPIC-ADD ON     Status: Abnormal   Collection Time   10/19/11  4:08 PM      Component Value Range Comment   Squamous Epithelial / LPF MANY (*) RARE     WBC, UA 7-10  <3 (WBC/hpf)    RBC / HPF 7-10  <3 (RBC/hpf)    Bacteria, UA FEW (*) RARE    PROTIME-INR     Status: Normal   Collection Time   10/19/11  4:20 PM      Component Value Range Comment   Prothrombin Time 12.8  11.6 - 15.2 (seconds)    INR 0.94  0.00 - 1.49    APTT     Status: Normal   Collection Time   10/19/11  4:20 PM      Component Value Range Comment   aPTT 26  24 - 37 (seconds)   CBC     Status: Abnormal   Collection Time   10/19/11  4:20 PM      Component Value Range Comment   WBC 11.2 (*) 4.0 - 10.5 (K/uL)    RBC  4.20  3.87 - 5.11 (MIL/uL)    Hemoglobin 11.8 (*) 12.0 - 15.0 (g/dL)    HCT 16.1  09.6 -  04.5 (%)    MCV 86.2  78.0 - 100.0 (fL)    MCH 28.1  26.0 - 34.0 (pg)    MCHC 32.6  30.0 - 36.0 (g/dL)    RDW 40.9  81.1 - 91.4 (%)    Platelets 267  150 - 400 (K/uL)   DIFFERENTIAL     Status: Abnormal   Collection Time   10/19/11  4:20 PM      Component Value Range Comment   Neutrophils Relative 83 (*) 43 - 77 (%)    Neutro Abs 9.2 (*) 1.7 - 7.7 (K/uL)    Lymphocytes Relative 10 (*) 12 - 46 (%)    Lymphs Abs 1.1  0.7 - 4.0 (K/uL)    Monocytes Relative 6  3 - 12 (%)    Monocytes Absolute 0.7  0.1 - 1.0 (K/uL)    Eosinophils Relative 1  0 - 5 (%)    Eosinophils Absolute 0.1  0.0 - 0.7 (K/uL)    Basophils Relative 0  0 - 1 (%)    Basophils Absolute 0.0  0.0 - 0.1 (K/uL)   COMPREHENSIVE METABOLIC PANEL     Status: Abnormal   Collection Time   10/19/11  4:20 PM      Component Value Range Comment   Sodium 137  135 - 145 (mEq/L)    Potassium 3.4 (*) 3.5 - 5.1 (mEq/L)    Chloride 99  96 - 112 (mEq/L)    CO2 27  19 - 32 (mEq/L)    Glucose, Bld 417 (*) 70 - 99 (mg/dL)    BUN 13  6 - 23 (mg/dL)    Creatinine, Ser 7.82  0.50 - 1.10 (mg/dL)    Calcium 9.2  8.4 - 10.5 (mg/dL)    Total Protein 6.8  6.0 - 8.3 (g/dL)    Albumin 2.7 (*) 3.5 - 5.2 (g/dL)    AST 14  0 - 37 (U/L)    ALT 15  0 - 35 (U/L)    Alkaline Phosphatase 150 (*) 39 - 117 (U/L)    Total Bilirubin 0.8  0.3 - 1.2 (mg/dL)    GFR calc non Af Amer >90  >90 (mL/min)    GFR calc Af Amer >90  >90 (mL/min)   CK TOTAL AND CKMB     Status: Abnormal   Collection Time   10/19/11  4:20 PM      Component Value Range Comment   Total CK 77  7 - 177 (U/L)    CK, MB 4.2 (*) 0.3 - 4.0 (ng/mL)    Relative Index RELATIVE INDEX IS INVALID  0.0 - 2.5    TROPONIN I     Status: Normal   Collection Time   10/19/11  4:20 PM      Component Value Range Comment   Troponin I <0.30  <0.30 (ng/mL)   ETHANOL     Status: Normal   Collection Time   10/19/11  4:20 PM      Component Value Range Comment   Alcohol, Ethyl (B) <11  0 - 11 (mg/dL)     GLUCOSE, CAPILLARY     Status: Abnormal   Collection Time   10/19/11  4:57 PM      Component Value Range Comment   Glucose-Capillary 406 (*) 70 - 99 (mg/dL)   GLUCOSE, CAPILLARY  Status: Abnormal   Collection Time   10/19/11  9:12 PM      Component Value Range Comment   Glucose-Capillary 275 (*) 70 - 99 (mg/dL)   HEMOGLOBIN Z6X     Status: Abnormal   Collection Time   10/20/11  6:30 AM      Component Value Range Comment   Hemoglobin A1C 12.3 (*) <5.7 (%)    Mean Plasma Glucose 306 (*) <117 (mg/dL)   LIPID PANEL     Status: Abnormal   Collection Time   10/20/11  6:30 AM      Component Value Range Comment   Cholesterol 175  0 - 200 (mg/dL)    Triglycerides 096 (*) <150 (mg/dL)    HDL 32 (*) >04 (mg/dL)    Total CHOL/HDL Ratio 5.5      VLDL 39  0 - 40 (mg/dL)    LDL Cholesterol 540 (*) 0 - 99 (mg/dL)   GLUCOSE, CAPILLARY     Status: Abnormal   Collection Time   10/20/11  7:35 AM      Component Value Range Comment   Glucose-Capillary 433 (*) 70 - 99 (mg/dL)    Comment 1 Documented in Chart      Comment 2 Notify RN     GLUCOSE, CAPILLARY     Status: Abnormal   Collection Time   10/20/11 11:45 AM      Component Value Range Comment   Glucose-Capillary 297 (*) 70 - 99 (mg/dL)    Comment 1 Documented in Chart      Comment 2 Notify RN     GLUCOSE, CAPILLARY     Status: Abnormal   Collection Time   10/20/11  4:56 PM      Component Value Range Comment   Glucose-Capillary 291 (*) 70 - 99 (mg/dL)    Comment 1 Documented in Chart      Comment 2 Notify RN     GLUCOSE, CAPILLARY     Status: Abnormal   Collection Time   10/20/11  9:22 PM      Component Value Range Comment   Glucose-Capillary 315 (*) 70 - 99 (mg/dL)   CBC     Status: Abnormal   Collection Time   10/21/11  5:15 AM      Component Value Range Comment   WBC 7.8  4.0 - 10.5 (K/uL)    RBC 3.85 (*) 3.87 - 5.11 (MIL/uL)    Hemoglobin 11.2 (*) 12.0 - 15.0 (g/dL)    HCT 98.1 (*) 19.1 - 46.0 (%)    MCV 87.8  78.0 - 100.0  (fL)    MCH 29.1  26.0 - 34.0 (pg)    MCHC 33.1  30.0 - 36.0 (g/dL)    RDW 47.8  29.5 - 62.1 (%)    Platelets 240  150 - 400 (K/uL)   BASIC METABOLIC PANEL     Status: Abnormal   Collection Time   10/21/11  5:15 AM      Component Value Range Comment   Sodium 138  135 - 145 (mEq/L)    Potassium 3.4 (*) 3.5 - 5.1 (mEq/L)    Chloride 103  96 - 112 (mEq/L)    CO2 28  19 - 32 (mEq/L)    Glucose, Bld 369 (*) 70 - 99 (mg/dL)    BUN 16  6 - 23 (mg/dL)    Creatinine, Ser 3.08  0.50 - 1.10 (mg/dL)    Calcium 8.2 (*) 8.4 - 10.5 (mg/dL)  GFR calc non Af Amer 68 (*) >90 (mL/min)    GFR calc Af Amer 79 (*) >90 (mL/min)   GLUCOSE, CAPILLARY     Status: Abnormal   Collection Time   10/21/11  7:41 AM      Component Value Range Comment   Glucose-Capillary 358 (*) 70 - 99 (mg/dL)     Dg Chest 2 View  16/11/958  *RADIOLOGY REPORT*  Clinical Data: Right hemiparesis.  Question mediastinal widening.  CHEST - 2 VIEW  Comparison: PA and lateral chest 08/22/2005 and single view of the chest 05/07/2011.  Findings: There is cardiomegaly.  Cardiac and mediastinal contours are unchanged.  Pulmonary edema seen on the most recent prior study has resolved.  No pleural effusion. Surgical clips left axilla noted in  IMPRESSION: Cardiomegaly without acute disease.  Unchanged appearance of the cardiomediastinal silhouette.  Original Report Authenticated By: Bernadene Bell. Maricela Curet, M.D.   Ct Head Wo Contrast  10/19/2011  *RADIOLOGY REPORT*  Clinical Data: Upper lower extremity weakness.  CT HEAD WITHOUT CONTRAST  Technique:  Contiguous axial images were obtained from the base of the skull through the vertex without contrast.  Comparison: None.  Findings: A focal area of hypoattenuation is present along the posterior limb of the left external capsule.  This could account for the patient's right-sided symptoms.  Minimal periventricular white matter hypoattenuation is present otherwise.  There may be some involvement of the  lateral left thalamus.  No other acute cortical infarct is present. An 8 mm hypodense lesion is present along the septum placement, near the foramen of Monro.  This may represent a colloid cyst.  There is no other hemorrhage or mass lesion.  No significant extra-axial fluid collection is present.  The paranasal sinuses and mastoid air cells are clear.  The osseous skull is intact.  IMPRESSION:  1.  An ill-defined 10 mm area of hypoattenuation involves the posterior limb of the left internal capsule and lateral thalamus. This may represent an age indeterminate infarct.  Acute infarction is not excluded. 2.  8 mm density along the septum pellucidum near the foramen of Monro.  This may represent a colloid cyst. 3.  Minimal periventricular white matter disease.  Original Report Authenticated By: Jamesetta Orleans. MATTERN, M.D.   US Carotid Duplex Bilateral  10/20/2011  *RADIOLOGY REPORT*  Clinical Data: Stroke  BILATERAL CAROTID DUPLEX ULTRASOUND  Technique: Gray scale imaging, color Doppler and duplex ultrasound was performed of bilateral carotid and vertebral arteries in the neck.  Comparison:  None.  Criteria:  Quantification of carotid stenosis is based on velocity parameters that correlate the residual internal carotid diameter with NASCET-based stenosis levels, using the diameter of the distal internal carotid lumen as the denominator for stenosis measurement.  The following velocity measurements were obtained:                   PEAK SYSTOLIC/END DIASTOLIC RIGHT ICA:                        102cm/sec CCA:                        78cm/sec SYSTOLIC ICA/CCA RATIO:     1.31 DIASTOLIC ICA/CCA RATIO: ECA:                        75cm/sec  LEFT ICA:  96cm/sec CCA:                        83cm/sec SYSTOLIC ICA/CCA RATIO:     1.15 DIASTOLIC ICA/CCA RATIO: ECA:                        101cm/sec  Findings:  RIGHT CAROTID ARTERY: Mild plaque in the bulb.  RIGHT VERTEBRAL ARTERY:  Antegrade.  LEFT CAROTID  ARTERY: Mild plaque in the bulb.  LEFT VERTEBRAL ARTERY:  Antegrade.  IMPRESSION: Less than 50% stenosis in the right and left internal carotid arteries.  Original Report Authenticated By: Donavan Burnet, M.D.    Review of Systems  Constitutional: Negative.   HENT: Negative.   Eyes: Negative.   Cardiovascular: Negative.   Gastrointestinal: Negative.   Genitourinary: Negative.   Musculoskeletal: Negative.   Skin: Negative.   Psychiatric/Behavioral: Negative.    Blood pressure 165/85, pulse 96, temperature 97.9 F (36.6 C), temperature source Oral, resp. rate 20, height 5\' 3"  (1.6 m), weight 103.874 kg (229 lb), SpO2 96.00%. Physical Exam  Assessment/Plan: See dictation.   [name] 10/21/2011, 8:33 AM

## 2011-10-21 NOTE — Progress Notes (Signed)
Physical Therapy Evaluation Patient Details Name: [name] MRN: [id number] DOB: [birth year] Today's Date: 10/21/2011  Problem List:  Patient Active Problem List  Diagnoses  . OSA (obstructive sleep apnea)  . PAD (peripheral artery disease)  . COPD (chronic obstructive pulmonary disease)  . Systolic and diastolic CHF, chronic  . CAD (coronary artery disease)  . Hypertension  . Hyperlipidemia  . Ejection fraction < 50%  . Diastolic dysfunction  . Syncope  . CVA (cerebral vascular accident)  . Hemiparesis, acute  . DM type 2 (diabetes mellitus, type 2)  . Hypokalemia    Past Medical History:  Past Medical History  Diagnosis Date  . GERD (gastroesophageal reflux disease)   . PAD (peripheral artery disease)     Stent, embolectomy, surgery  /     Dr. Darrick Penna, ABI 0.52 right, 0.54 left, February, 2010  . COPD (chronic obstructive pulmonary disease)     Dr. Craige Cotta, medications, PFTs August, 2012  . OSA (obstructive sleep apnea)     Dr. Craige Cotta  CPAP started August, 2012  . Systolic and diastolic CHF, chronic   . CAD (coronary artery disease)     June, 2012, DES to RCA, staged DES to circumflex and DES to diagonal with pressure wire to LAD, FFR 0.84  . DM (diabetes mellitus), type 2   . Hypertension   . Morbid obesity   . Hyperlipidemia   . Ejection fraction < 50%     EF 45-50%, echo, June, 2012  . Diastolic dysfunction     grade 2, echo, June, 2012  . Peripheral neuropathy   . Tobacco abuse   . Syncope     vasovagal  . Breast cancer     lumpectomy and radiation 2002  . MI, old    Past Surgical History:  Past Surgical History  Procedure Date  . Appendectomy   . Cholecystectomy   . Tubal ligation   . Breast surgery     Left breast cancer  . Cardiac catheterization 2002  . Popliteal artery angioplasty 11/30/10    Left leg w/ embolectomy, endarterectomy  by Dr.Charles Fields  . Coronary angioplasty with stent placement     PT Assessment/Plan/Recommendation PT  Assessment Clinical Impression Statement: very pleasant, obese female who is very sedentary at baseline...now is severely impacted by R hemiparesis (is R dominant)...her daughter lives with pt, but according to pt, is not in very good health...will definately need rehab at d/c, either in SNF or inpatient rehab setting...pt is agreeable PT Recommendation/Assessment: Patient will need skilled PT in the acute care venue PT Problem List: Decreased strength;Decreased activity tolerance;Decreased balance;Decreased mobility;Decreased coordination;Decreased knowledge of precautions;Decreased safety awareness;Obesity;Impaired sensation Barriers to Discharge: Decreased caregiver support;Inaccessible home environment PT Therapy Diagnosis : Difficulty walking;Generalized weakness;Hemiplegia dominant side PT Plan PT Frequency: Min 4X/week PT Treatment/Interventions: Gait training;Functional mobility training;Therapeutic activities;Therapeutic exercise;Balance training;Neuromuscular re-education;Patient/family education PT Recommendation Recommendations for Other Services: OT consult Follow Up Recommendations: Inpatient Rehab;Skilled nursing facility Equipment Recommended: Defer to next venue PT Goals  Acute Rehab PT Goals PT Goal Formulation: With patient Time For Goal Achievement: 7 days Pt will go Supine/Side to Sit: with supervision;with HOB not 0 degrees (comment degree) (30 deg) Pt will Transfer Bed to Chair/Chair to Bed: with mod assist Pt will Ambulate: 1 - 15 feet;with max assist;with rolling walker  PT Evaluation Precautions/Restrictions  Precautions Precautions: Fall Precaution Comments: pt states that her balance isn't very good at baseline-tends to fall backward or forward and therefore uses a walker at home...hasn't had  a fall since this past summer Required Braces or Orthoses: No Restrictions Weight Bearing Restrictions: No Prior Functioning  Home Living Lives With: Daughter Receives  Help From: Family Type of Home: Mobile home Home Layout: One level Home Access: Stairs to enter Entrance Stairs-Rails: Right Entrance Stairs-Number of Steps: 3 Bathroom Shower/Tub: Engineer, manufacturing systems: Standard Home Adaptive Equipment: Walker - rolling;Wheelchair - manual Additional Comments: uses a w/c for outings Prior Function Level of Independence: Independent with basic ADLs;Independent with homemaking with ambulation;Independent with gait;Independent with transfers;Requires assistive device for independence Driving: Yes Cognition Cognition Arousal/Alertness: Awake/alert Overall Cognitive Status: Appears within functional limits for tasks assessed Orientation Level: Oriented X4 Sensation/Coordination Sensation Light Touch: Impaired by gross assessment (peripheral neuropathy both feet) Stereognosis: Not tested Hot/Cold: Not tested Proprioception: Impaired by gross assessment Coordination Gross Motor Movements are Fluid and Coordinated: No Fine Motor Movements are Fluid and Coordinated: No Coordination and Movement Description: has difficulty with spatial recognition R and L Finger Nose Finger Test: able to do with RUE, but very slow Extremity Assessment RUE Assessment RUE Assessment: Not tested LUE Assessment LUE Assessment: Not tested RLE Assessment RLE Assessment: Exceptions to [facility] RLE Strength RLE Overall Strength Comments: pt appears to be deconditioned at baseline, but RLE is still significantly weak...overall strength is 3/5 LLE Assessment LLE Assessment: Exceptions to [facility] LLE Strength LLE Overall Strength Comments: generally 3+/5 Mobility (including Balance) Bed Mobility Bed Mobility: Yes Rolling Right: 7: Independent;With rail Right Sidelying to Sit: 3: Mod assist;With rails Right Sidelying to Sit Details (indicate cue type and reason): has difficulty pushing up with UEs to achieve sitting...needed assist of trunk to sit erectly...she c/o some  lightheadedness with sitting Sit to Supine - Right: 6: Modified independent (Device/Increase time) Transfers Transfers: Yes Sit to Stand: 5: Supervision Sit to Stand Details (indicate cue type and reason): cues for proper hand placement, cues to increase base of support Stand to Sit: 5: Supervision Anterior-Posterior Transfer: 2: Max Forensic psychologist Details (indicate cue type and reason): attepted to use walker to take a step forward...only able to advance R foot slightly due to weakness of RLE  Ambulation/Gait Ambulation/Gait: No (unable) Stairs: No Wheelchair Mobility Wheelchair Mobility: No  Posture/Postural Control Posture/Postural Control: No significant limitations Balance Balance Assessed: Yes Static Sitting Balance Static Sitting - Level of Assistance: 7: Independent Dynamic Sitting Balance Reach (Patient is able to reach ___ inches to right, left, forward, back): able to reach 6" L and 3" R Exercise    End of Session PT - End of Session Equipment Utilized During Treatment: Gait belt Activity Tolerance: Patient tolerated treatment well Patient left: in bed;with call bell in reach;with bed alarm set General Behavior During Session: [facility] for tasks performed Cognition: [facility] for tasks performed  Konrad Penta 10/21/2011, 11:57 AM

## 2011-10-21 NOTE — Progress Notes (Signed)
UR Chart Review Completed  

## 2011-10-22 LAB — GLUCOSE, CAPILLARY
Glucose-Capillary: 203 mg/dL — ABNORMAL HIGH (ref 70–99)
Glucose-Capillary: 260 mg/dL — ABNORMAL HIGH (ref 70–99)

## 2011-10-22 MED ORDER — NYSTATIN 100000 UNIT/GM EX POWD
Freq: Two times a day (BID) | CUTANEOUS | Status: DC
  Administered 2011-10-22 – 2011-10-23 (×2): via TOPICAL
  Filled 2011-10-22: qty 15

## 2011-10-22 MED ORDER — ZOLPIDEM TARTRATE 5 MG PO TABS
5.0000 mg | ORAL_TABLET | Freq: Every evening | ORAL | Status: DC | PRN
  Administered 2011-10-22: 5 mg via ORAL
  Filled 2011-10-22: qty 1

## 2011-10-22 MED ORDER — NYSTATIN 100000 UNIT/GM EX POWD
CUTANEOUS | Status: AC
  Filled 2011-10-22: qty 15

## 2011-10-22 NOTE — Progress Notes (Signed)
Physical Therapy Treatment Patient Details Name: KATLYNE NISHIDA MRN: [id number] DOB: 08/16/1951 Today's Date: 10/22/2011  TIME: 1040-1107/3mins neuro re-ed/7mins GT  PT Assessment/Plan  PT - Assessment/Plan Comments on Treatment Session: Pt was able to advance RLE in EOB exercises today and complete gait training which pt needed Min/Mod A with RW for assistance with balance due to right lateral  and occasional posterior leaning; pt ambulated 3' fwd/retro x3 with seated rest breaks between each due to fatique,; vitals checked during  gait rest rest break BP 171/7 O2 99%   HR 103 PT Goals  Acute Rehab PT Goals PT Goal: Supine/Side to Sit - Progress: Progressing toward goal PT Transfer Goal: Bed to Chair/Chair to Bed - Progress: Met PT Goal: Ambulate - Progress: Progressing toward goal  PT Treatment Precautions/Restrictions  Precautions Precautions: Fall Precaution Comments: pt states that her balance isn't very good at baseline-tends to fall backward or forward and therefore uses a walker at home...hasn't had a fall since this past summer Required Braces or Orthoses: No Restrictions Weight Bearing Restrictions: No Mobility (including Balance) Bed Mobility Sit to Supine - Left: HOB elevated (comment degrees);4: Min assist Sit to Supine - Left Details (indicate cue type and reason): HOB elevated 50 degrees;slight assistance needed advancing RLE to EOB Transfers Sit to Stand: 5: Supervision Stand to Sit: 4: Min assist Stand to Sit Details: verbal cues for reaching back to surface and assistance with controlling descent Anterior-Posterior Transfer: 4: Min Forensic psychologist Details (indicate cue type and reason): verbal cues to widen BOS and sequencing of gait/RW technique Ambulation/Gait Ambulation/Gait: Yes (3' fwd/retro-rest break x3) Ambulation/Gait Assistance: 4: Min assist;3: Mod assist Ambulation/Gait Assistance Details (indicate cue type and reason): verbal  cues to widen BOS and use of UE on RW to assist with weakness of RLE Ambulation Distance (Feet): 9 Feet (3' fwd/retro-rest break x3) Assistive device: Rolling walker Gait Pattern: Step-through pattern;Lateral trunk lean to right;Decreased stride length Gait velocity: slow    Exercise  General Exercises - Lower Extremity Long Arc Quad: Both;10 reps Other Exercises Other Exercises: EOB reaching activities x10 bilaterally Other Exercises: seated toe touches forward x10 bilaterally Other Exercises: standing lateral weight shifting x56min End of Session PT - End of Session Equipment Utilized During Treatment: Gait belt Activity Tolerance: Patient limited by fatigue ("wiggly" feeling) Patient left: in chair;with call bell in reach (chair alarm) Nurse Communication: Mobility status for transfers General Behavior During Session: [facility] for tasks performed Cognition: [facility] for tasks performed  Hitoshi Werts ATKINSO 10/22/2011, 11:38 AM

## 2011-10-22 NOTE — Progress Notes (Signed)
Occupational Therapy Evaluation Patient Details Name: [name] MRN: [id number] DOB: [birth year] Today's Date: 10/22/2011 OT Eval 1230-100  Problem List:  Patient Active Problem List  Diagnoses  . OSA (obstructive sleep apnea)  . PAD (peripheral artery disease)  . COPD (chronic obstructive pulmonary disease)  . Systolic and diastolic CHF, chronic  . CAD (coronary artery disease)  . Hypertension  . Hyperlipidemia  . Ejection fraction < 50%  . Diastolic dysfunction  . Syncope  . CVA (cerebral vascular accident)  . Hemiparesis, acute  . DM type 2 (diabetes mellitus, type 2)  . Hypokalemia    Past Medical History:  Past Medical History  Diagnosis Date  . GERD (gastroesophageal reflux disease)   . PAD (peripheral artery disease)     Stent, embolectomy, surgery  /     Dr. Darrick Penna, ABI 0.52 right, 0.54 left, February, 2010  . COPD (chronic obstructive pulmonary disease)     Dr. Craige Cotta, medications, PFTs August, 2012  . OSA (obstructive sleep apnea)     Dr. Craige Cotta  CPAP started August, 2012  . Systolic and diastolic CHF, chronic   . CAD (coronary artery disease)     June, 2012, DES to RCA, staged DES to circumflex and DES to diagonal with pressure wire to LAD, FFR 0.84  . DM (diabetes mellitus), type 2   . Hypertension   . Morbid obesity   . Hyperlipidemia   . Ejection fraction < 50%     EF 45-50%, echo, June, 2012  . Diastolic dysfunction     grade 2, echo, June, 2012  . Peripheral neuropathy   . Tobacco abuse   . Syncope     vasovagal  . Breast cancer     lumpectomy and radiation 2002  . MI, old    Past Surgical History:  Past Surgical History  Procedure Date  . Appendectomy   . Cholecystectomy   . Tubal ligation   . Breast surgery     Left breast cancer  . Cardiac catheterization 2002  . Popliteal artery angioplasty 11/30/10    Left leg w/ embolectomy, endarterectomy  by Dr.Charles Fields  . Coronary angioplasty with stent placement     OT  Assessment/Plan/Recommendation OT Assessment Clinical Impression Statement: A:  Patient presents with decreased I with BADLs s/p CVA and right hemiparesis. OT Recommendation/Assessment: Patient will need skilled OT in the acute care venue OT Problem List: Decreased strength;Decreased range of motion;Decreased activity tolerance;Impaired vision/perception;Decreased coordination;Decreased safety awareness;Impaired tone;Impaired UE functional use OT Therapy Diagnosis : Hemiplegia dominant side OT Plan OT Frequency: Min 2X/week OT Treatment/Interventions: Self-care/ADL training;Therapeutic exercise;Neuromuscular education;Therapeutic activities;DME and/or AE instruction;Patient/family education;Visual/perceptual remediation/compensation OT Recommendation Recommendations for Other Services: Rehab consult Follow Up Recommendations: Inpatient Rehab Equipment Recommended: Defer to next venue Individuals Consulted Consulted and Agree with Results and Recommendations: Patient OT Goals Acute Rehab OT Goals OT Goal Formulation: With patient Time For Goal Achievement: 2 weeks ADL Goals Pt Will Perform Grooming: with set-up;Sitting, edge of bed;Other (comment) (using dominant right hand.) Pt Will Perform Lower Body Dressing: with mod assist;Sitting, bed;Other (comment) (seated eob, using right ue as an active assist) Miscellaneous OT Goals Miscellaneous OT Goal #1: Patient will use RUE as an active assist with all BADLs. Miscellaneous OT Goal #2: Patient will increase AROM in right upper extremity by 25% for increased ability to use RUE with daily tasks.  OT Evaluation Precautions/Restrictions  Precautions Precautions: Fall Precaution Comments: pt states that her balance isn't very good at baseline-tends to fall backward or  forward and therefore uses a walker at home...hasn't had a fall since this past summer Required Braces or Orthoses: No Restrictions Weight Bearing Restrictions: No Prior  Functioning Home Living Lives With: Daughter Receives Help From: Family   ADL ADL Eating/Feeding: Performed;Set up Eating/Feeding Details (indicate cue type and reason): using primarily left hand to eat, used dominant right hand to stabilize bowl. Where Assessed - Eating/Feeding: Chair Grooming: Performed;Brushing hair;Set up Grooming Details (indicate cue type and reason): Used right hand to comb hair with min-mod difficulty and then switched to non dominant left hand to complete task. Where Assessed - Grooming: Sitting, chair Lower Body Dressing: Performed;Maximal assistance Lower Body Dressing Details (indicate cue type and reason): Attempted to doff sock long sitting in recliner and bending knee.  Patient was unable to complete, will require max pa with LE dressing.  Patient states she typically does not wear socks and slides her shoes on.  She is typically able to don underwear and pants by fishing foot through leg hole.  Patient would not be able to do so this date. Where Assessed - Lower Body Dressing: Sitting, chair;Supported Statistician: Performed Vision/Perception  Vision - History Baseline Vision: No visual deficits Financial risk analyst Arousal/Alertness: Awake/alert Overall Cognitive Status: Appears within functional limits for tasks assessed Orientation Level: Oriented X4 Sensation/Coordination Sensation Light Touch: Appears Intact Proprioception: Impaired by gross assessment Additional Comments: With RUE, patient was slow to find correct position in space demonstrated by OT. Coordination Gross Motor Movements are Fluid and Coordinated: No (Decreased GMC in right arm ) Fine Motor Movements are Fluid and Coordinated: No (Patient was able to oppose thumb to IF, LF with labored mvmt) Finger Nose Finger Test: Completed with right hand with very slow, discoordinated movements. Extremity Assessment RUE Assessment RUE Assessment:  (PROM is WFL, Shoulder AROM is 30%.   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 LUE Assessment LUE Assessment: Within Functional Limits Mobility  Bed Mobility Sit to Supine - Left: HOB elevated (comment degrees);4: Min assist Sit to Supine - Left Details (indicate cue type and reason): HOB elevated 50 degrees;slight assistance needed advancing RLE to EOB Transfers Sit to Stand: 5: Supervision Stand to Sit: 4: Min assist Stand to Sit Details: verbal cues for reaching back to surface and assistance with controlling descent Exercises  End of Session OT - End of Session Activity Tolerance: Patient tolerated treatment well Patient left: in chair General Behavior During Session: [facility] for tasks performed Cognition: [facility] for tasks performed   Shirlean Mylar, OTR/L  10/22/2011, 1:17 PM

## 2011-10-22 NOTE — Discharge Summary (Signed)
Patient ID: [name] MRN: [id number] DOB/AGE: 04/02/1951 60 y.o.  Admit date: 10/19/2011 Tentative Discharge date: 10/23/2011  Primary Care Physician:  Kirstie Peri, MD  Discharge Diagnoses:    Present on Admission:  .CVA (cerebral vascular accident) .Hemiparesis, acute .DM type 2 (diabetes mellitus, type 2) .Hypokalemia .COPD (chronic obstructive pulmonary disease) .CAD (coronary artery disease) .Systolic and diastolic CHF, chronic .Hypertension .Hyperlipidemia    Current Discharge Medication List    CONTINUE these medications which have NOT CHANGED   Details  albuterol-ipratropium (COMBIVENT) 18-103 MCG/ACT inhaler Inhale 2 puffs into the lungs every 3 (three) hours as needed.     amLODipine (NORVASC) 5 MG tablet Take 5 mg by mouth daily.     aspirin 81 MG tablet Take 81 mg by mouth daily.      clindamycin (CLEOCIN) 150 MG capsule Take 150 mg by mouth daily as needed. When patient feels that cellulitis in foot is going to flare up.     clopidogrel (PLAVIX) 75 MG tablet Take 75 mg by mouth daily.      furosemide (LASIX) 40 MG tablet Take 40 mg by mouth daily.      insulin glargine (LANTUS) 100 UNIT/ML injection Inject 48 Units into the skin daily.      insulin regular (HUMULIN R,NOVOLIN R) 100 units/mL injection Inject into the skin as directed. Per sliding scale    metoCLOPramide (REGLAN) 5 MG tablet Take 5 mg by mouth 4 (four) times daily.      metoprolol tartrate (LOPRESSOR) 25 MG tablet Take 25 mg by mouth 2 (two) times daily.      nitroGLYCERIN (NITROSTAT) 0.4 MG SL tablet Place 1 tablet (0.4 mg total) under the tongue every 5 (five) minutes as needed for chest pain. Qty: 25 tablet, Refills: 3    ondansetron (ZOFRAN) 4 MG tablet Take 4 mg by mouth every 8 (eight) hours as needed. Nausea     pantoprazole (PROTONIX) 40 MG tablet Take 40 mg by mouth daily.      rosuvastatin (CRESTOR) 40 MG tablet Take 40 mg by mouth daily.          Disposition and  Follow-up: Patient will need physical therapy post discharge.  She is being evaluated for Inpatient Rehab placement vs. Short term rehab at nursing facility placement.  Consults: Dr. Gerilyn Pilgrim, Neurology  Significant Diagnostic Studies:  Dg Chest 2 View  10/19/2011  *RADIOLOGY REPORT*  Clinical Data: Right hemiparesis.  Question mediastinal widening.  CHEST - 2 VIEW  Comparison: PA and lateral chest 08/22/2005 and single view of the chest 05/07/2011.  Findings: There is cardiomegaly.  Cardiac and mediastinal contours are unchanged.  Pulmonary edema seen on the most recent prior study has resolved.  No pleural effusion. Surgical clips left axilla noted in  IMPRESSION: Cardiomegaly without acute disease.  Unchanged appearance of the cardiomediastinal silhouette.  Original Report Authenticated By: Bernadene Bell. Maricela Curet, M.D.   Ct Head Wo Contrast  10/19/2011  *RADIOLOGY REPORT*  Clinical Data: Upper lower extremity weakness.  CT HEAD WITHOUT CONTRAST  Technique:  Contiguous axial images were obtained from the base of the skull through the vertex without contrast.  Comparison: None.  Findings: A focal area of hypoattenuation is present along the posterior limb of the left external capsule.  This could account for the patient's right-sided symptoms.  Minimal periventricular white matter hypoattenuation is present otherwise.  There may be some involvement of the lateral left thalamus.  No other acute cortical infarct is present. An 8 mm hypodense  lesion is present along the septum placement, near the foramen of Monro.  This may represent a colloid cyst.  There is no other hemorrhage or mass lesion.  No significant extra-axial fluid collection is present.  The paranasal sinuses and mastoid air cells are clear.  The osseous skull is intact.  IMPRESSION:  1.  An ill-defined 10 mm area of hypoattenuation involves the posterior limb of the left internal capsule and lateral thalamus. This may represent an age  indeterminate infarct.  Acute infarction is not excluded. 2.  8 mm density along the septum pellucidum near the foramen of Monro.  This may represent a colloid cyst. 3.  Minimal periventricular white matter disease.  Original Report Authenticated By: Jamesetta Orleans. MATTERN, M.D.   US Carotid Duplex Bilateral  10/20/2011  *RADIOLOGY REPORT*  Clinical Data: Stroke  BILATERAL CAROTID DUPLEX ULTRASOUND  Technique: Gray scale imaging, color Doppler and duplex ultrasound was performed of bilateral carotid and vertebral arteries in the neck.  Comparison:  None.  Criteria:  Quantification of carotid stenosis is based on velocity parameters that correlate the residual internal carotid diameter with NASCET-based stenosis levels, using the diameter of the distal internal carotid lumen as the denominator for stenosis measurement.  The following velocity measurements were obtained:                   PEAK SYSTOLIC/END DIASTOLIC RIGHT ICA:                        102cm/sec CCA:                        78cm/sec SYSTOLIC ICA/CCA RATIO:     1.31 DIASTOLIC ICA/CCA RATIO: ECA:                        75cm/sec  LEFT ICA:                        96cm/sec CCA:                        83cm/sec SYSTOLIC ICA/CCA RATIO:     1.15 DIASTOLIC ICA/CCA RATIO: ECA:                        101cm/sec  Findings:  RIGHT CAROTID ARTERY: Mild plaque in the bulb.  RIGHT VERTEBRAL ARTERY:  Antegrade.  LEFT CAROTID ARTERY: Mild plaque in the bulb.  LEFT VERTEBRAL ARTERY:  Antegrade.  IMPRESSION: Less than 50% stenosis in the right and left internal carotid arteries.  Original Report Authenticated By: Donavan Burnet, M.D.    Brief H and P: For complete details please refer to admission H and P, but in brief This is a 60 year old female with history of coronary artery disease, combined congestive heart failure, uncontrolled diabetes, hypertension, hyperlipidemia. The patient was brought to the hospital today after she woke up at 3 AM this morning with  complaints of right-sided weakness. Patient had felt normal when she went to sleep the night before. She woke up with feelings of numbness on her right arm and leg and also she reports that her extremities felt heavier than normal. She denies any changes in her vision, no difficulty swallowing. Her family reports that earlier she did have some slurring of her speech which has since improved. She denies any chest pain or shortness  of breath. She is brought to the emergency room for evaluation and has been referred for further workup of stroke   Physical Exam on Discharge:  Filed Vitals:   10/21/11 1300 10/21/11 2240 10/22/11 0145 10/22/11 0628  BP: 121/73 163/68 151/92 128/88  Pulse: 92 102 94 101  Temp: 98.8 F (37.1 C) 98.6 F (37 C) 98.1 F (36.7 C) 98.3 F (36.8 C)  TempSrc:  Oral Oral Oral  Resp: 16  12 16   Height:      Weight:    98.4 kg (216 lb 14.9 oz)  SpO2: 92% 95% 91% 98%     Intake/Output Summary (Last 24 hours) at 10/22/11 1159 Last data filed at 10/22/11 0500  Gross per 24 hour  Intake    240 ml  Output   1050 ml  Net   -810 ml    General: Alert, awake, oriented x3, in no acute distress. HEENT: No bruits, no goiter. Heart: Regular rate and rhythm, without murmurs, rubs, gallops. Lungs: crackles at right base Abdomen: Soft, nontender, nondistended, positive bowel sounds. Extremities: No clubbing cyanosis or edema with positive pedal pulses.   CBC:    Component Value Date/Time   WBC 7.8 10/21/2011 0515   HGB 11.2* 10/21/2011 0515   HCT 33.8* 10/21/2011 0515   PLT 240 10/21/2011 0515   MCV 87.8 10/21/2011 0515   NEUTROABS 9.2* 10/19/2011 1620   LYMPHSABS 1.1 10/19/2011 1620   MONOABS 0.7 10/19/2011 1620   EOSABS 0.1 10/19/2011 1620   BASOSABS 0.0 10/19/2011 1620    Basic Metabolic Panel:    Component Value Date/Time   NA 138 10/21/2011 0515   K 3.4* 10/21/2011 0515   CL 103 10/21/2011 0515   CO2 28 10/21/2011 0515   BUN 16 10/21/2011 0515   CREATININE  0.90 10/21/2011 0515   GLUCOSE 369* 10/21/2011 0515   CALCIUM 8.2* 10/21/2011 0515    Hospital Course:  1. CVA.  Patient presented with acute Right sided weakness.  There was concern for stroke, so patient was admitted for further evaluation. MRI of the brain revealed a acute infarct in the left lateral thalamus and internal capsule.  She was seen in consultation by neurology.  She was already on aspirin and plavix for her recent cardiac stents.  She was continued on the same.  She is already on a statin.  She will need to implement further lifestyle changes. Basically she will need risk factor modifications.   She is being evaluated by inpatient rehab to see if she qualifies for therapy.  The alternative would be going to SNF for therapy 2. Uncontrolled diabetes.  Her insulin is being adjsuted.  This appears to be a chronic problem and she may benefit from an outpatient endocrinologist appt 3. HTN stable 4. Obesity 5. Chronic Combined CHF, compensated. 6. DNR.  Time spent on Discharge:  Signed: MEMON,JEHANZEB 10/22/2011, 11:59 AM

## 2011-10-22 NOTE — Progress Notes (Signed)
Subjective: Interval History:   Objective: Vital signs in last 24 hours: Temp:  [98 F (36.7 C)-98.8 F (37.1 C)] 98.3 F (36.8 C) (12/11 0628) Pulse Rate:  [92-102] 101  (12/11 0628) Resp:  [12-20] 16  (12/11 0628) BP: (121-163)/(63-92) 128/88 mmHg (12/11 0628) SpO2:  [91 %-99 %] 98 % (12/11 0628) Weight:  [98.4 kg (216 lb 14.9 oz)] 216 lb 14.9 oz (98.4 kg) (12/11 0628)  Intake/Output from previous day: 12/10 0701 - 12/11 0700 In: 420 [P.O.:420] Out: 1050 [Urine:1050] Intake/Output this shift:   Nutritional status: Carb Control    Lab Results:  Basename 10/21/11 0515 10/19/11 1620  WBC 7.8 11.2*  HGB 11.2* 11.8*  HCT 33.8* 36.2  PLT 240 267  NA 138 137  K 3.4* 3.4*  CL 103 99  CO2 28 27  GLUCOSE 369* 417*  BUN 16 13  CREATININE 0.90 0.73  CALCIUM 8.2* 9.2  LABA1C -- --   Lipid Panel  Basename 10/20/11 0630  CHOL 175  TRIG 195*  HDL 32*  CHOLHDL 5.5  VLDL 39  LDLCALC 161*    Studies/Results: Mr Angiogram Head Wo Contrast  10/21/2011  *RADIOLOGY REPORT*  Clinical Data: 60 year old female with right side arm and leg weakness and speech changes.  History of breast cancer.  Possible stroke.  Comparison:  [facility] Head CT without contrast 02/14/2008.  [facility] Head CT without contrast 10/19/2011.  MRI HEAD WITHOUT CONTRAST  Technique: Multiplanar, multiecho pulse sequences of the brain and surrounding structures were obtained according to standard protocol without intravenous contrast.  Findings: There is a globular acute infarct centered at the left deep gray matter nuclei involving the posterior limb of the left internal capsule and left thalamus measuring 15 mm.  There is associated T2 and FLAIR hyperintensity.  No associated acute hemorrhage or mass effect.  There also appears to be a patchy area of restricted diffusion in the inferior left medial occipital lobe (series 2 image 8) with associated T2 and FLAIR hyperintensity but no  mass effect or definite hemorrhage.  This might be more subacute.  Diffusion elsewhere is within normal limits. Major intracranial vascular flow voids are preserved, see MRA findings below.   T2 heterogeneous lesion associated with the interhemispheric fissure at or near the left foramen of Monro has signal loss on T2* imaging and is isointense on T1 (series 6 image 47). This measures 12 x 13 x 8 mm (AP by transverse by CC).  This appears to be just anterior to the columns of the fornix, rather than posterior as expected for colloid cyst.  There is no associated ventriculomegaly or ventricular signal abnormality.  No second similar lesion is identified.  No other intracranial mass lesion.  Patchy periventricular T2 hyperintensity.  Negative pituitary, cervicomedullary junction, and cerebellum.  Patchy white matter T2 and FLAIR hyperintensity in the pons. Visualized bone marrow signal is within normal limits.  Visualized paranasal sinuses and mastoids are clear.  Visualized orbit soft tissues are within normal limits.  Visualized scalp soft tissues are within normal limits.  IMPRESSION: 1.  Acute infarct (15 mm diameter) centered at the left lateral thalamus and internal capsule. No mass effect or associated hemorrhage. 2.  Subacute appearing left inferior PCA territory infarct in the left occipital lobe.  No mass effect or hemorrhage. 3. Chronic rounded mass at or near the left foramen of Monro with blood products or mineralization is stable since 2009 and measures 12 x 13 x 8 mm. This is  not typical of a colloid cyst. I favor a small benign neoplasm (possibly a meningioma given T2* characteristics, central neurocytoma is another consideration). Aneurysm is not favored (see MRA below). Recommend repeat pre and post contrast brain MRI (ideally when the patient can better tolerate with less motion artifact). 4.  MRA findings are below.  MRA HEAD WITHOUT CONTRAST  Technique: Angiographic images of the Circle of Willis  were obtained using MRA technique without  intravenous contrast.  Findings: Intermittently degraded by motion.  Antegrade flow in the posterior circulation with codominant distal vertebral arteries.  Basilar artery irregularity without hemodynamically significant stenosis.  PCA origins are patent. There does appear to be asymmetrically decreased distal left PCA flow, but without focal high-grade stenosis.  The right posterior communicating artery is present.  Antegrade flow in both ICA siphons.  Signal loss in the vertical petrous and cavernous segments is favored to reflect atherosclerosis and genu and stenosis rather than an artifact. Subsequently there are bilateral high-grade ICA stenoses, worse on the right (series 7 image 10).  The supraclinoid ICA is and ICA termini remain patent.  MCA and ACA origins are patent.  Anterior communicating artery appears diminutive, normal.  Visualized ACA branches are within normal limits and not in proximity to the left foramen of Monro lesion described above.  The right MCA M1 segment is within normal limits.  There is early right MCA branching.  No major right MCA branch occlusion is evident.  The left MCA M1 segment is irregular with mildly decreased left MCA flow compared to the right.  No major left MCA branch occlusion is identified.  IMPRESSION: 1.  Degraded by motion.  Left MCA irregularity but no major left MCA branch occlusion. 2.  High-grade tandem bilateral ICA siphon stenoses, worse on the right. 3.  ACA branches and anterior communicating artery appear within normal limits and not in proximity to the left foramen of Monro area lesion described on the MRI.  Original Report Authenticated By: Harley Hallmark, M.D.   Mr Brain Wo Contrast  10/21/2011  *RADIOLOGY REPORT*  Clinical Data: 60 year old female with right side arm and leg weakness and speech changes.  History of breast cancer.  Possible stroke.  Comparison:  [facility] Head CT without  contrast 02/14/2008.  [facility] Head CT without contrast 10/19/2011.  MRI HEAD WITHOUT CONTRAST  Technique: Multiplanar, multiecho pulse sequences of the brain and surrounding structures were obtained according to standard protocol without intravenous contrast.  Findings: There is a globular acute infarct centered at the left deep gray matter nuclei involving the posterior limb of the left internal capsule and left thalamus measuring 15 mm.  There is associated T2 and FLAIR hyperintensity.  No associated acute hemorrhage or mass effect.  There also appears to be a patchy area of restricted diffusion in the inferior left medial occipital lobe (series 2 image 8) with associated T2 and FLAIR hyperintensity but no mass effect or definite hemorrhage.  This might be more subacute.  Diffusion elsewhere is within normal limits. Major intracranial vascular flow voids are preserved, see MRA findings below.   T2 heterogeneous lesion associated with the interhemispheric fissure at or near the left foramen of Monro has signal loss on T2* imaging and is isointense on T1 (series 6 image 47). This measures 12 x 13 x 8 mm (AP by transverse by CC).  This appears to be just anterior to the columns of the fornix, rather than posterior as expected for colloid cyst.  There is no associated ventriculomegaly or ventricular signal abnormality.  No second similar lesion is identified.  No other intracranial mass lesion.  Patchy periventricular T2 hyperintensity.  Negative pituitary, cervicomedullary junction, and cerebellum.  Patchy white matter T2 and FLAIR hyperintensity in the pons. Visualized bone marrow signal is within normal limits.  Visualized paranasal sinuses and mastoids are clear.  Visualized orbit soft tissues are within normal limits.  Visualized scalp soft tissues are within normal limits.  IMPRESSION: 1.  Acute infarct (15 mm diameter) centered at the left lateral thalamus and internal capsule. No mass effect or  associated hemorrhage. 2.  Subacute appearing left inferior PCA territory infarct in the left occipital lobe.  No mass effect or hemorrhage. 3. Chronic rounded mass at or near the left foramen of Monro with blood products or mineralization is stable since 2009 and measures 12 x 13 x 8 mm. This is not typical of a colloid cyst. I favor a small benign neoplasm (possibly a meningioma given T2* characteristics, central neurocytoma is another consideration). Aneurysm is not favored (see MRA below). Recommend repeat pre and post contrast brain MRI (ideally when the patient can better tolerate with less motion artifact). 4.  MRA findings are below.  MRA HEAD WITHOUT CONTRAST  Technique: Angiographic images of the Circle of Willis were obtained using MRA technique without  intravenous contrast.  Findings: Intermittently degraded by motion.  Antegrade flow in the posterior circulation with codominant distal vertebral arteries.  Basilar artery irregularity without hemodynamically significant stenosis.  PCA origins are patent. There does appear to be asymmetrically decreased distal left PCA flow, but without focal high-grade stenosis.  The right posterior communicating artery is present.  Antegrade flow in both ICA siphons.  Signal loss in the vertical petrous and cavernous segments is favored to reflect atherosclerosis and genu and stenosis rather than an artifact. Subsequently there are bilateral high-grade ICA stenoses, worse on the right (series 7 image 10).  The supraclinoid ICA is and ICA termini remain patent.  MCA and ACA origins are patent.  Anterior communicating artery appears diminutive, normal.  Visualized ACA branches are within normal limits and not in proximity to the left foramen of Monro lesion described above.  The right MCA M1 segment is within normal limits.  There is early right MCA branching.  No major right MCA branch occlusion is evident.  The left MCA M1 segment is irregular with mildly decreased left  MCA flow compared to the right.  No major left MCA branch occlusion is identified.  IMPRESSION: 1.  Degraded by motion.  Left MCA irregularity but no major left MCA branch occlusion. 2.  High-grade tandem bilateral ICA siphon stenoses, worse on the right. 3.  ACA branches and anterior communicating artery appear within normal limits and not in proximity to the left foramen of Monro area lesion described on the MRI.  Original Report Authenticated By: Harley Hallmark, M.D.   US Carotid Duplex Bilateral  10/20/2011  *RADIOLOGY REPORT*  Clinical Data: Stroke  BILATERAL CAROTID DUPLEX ULTRASOUND  Technique: Gray scale imaging, color Doppler and duplex ultrasound was performed of bilateral carotid and vertebral arteries in the neck.  Comparison:  None.  Criteria:  Quantification of carotid stenosis is based on velocity parameters that correlate the residual internal carotid diameter with NASCET-based stenosis levels, using the diameter of the distal internal carotid lumen as the denominator for stenosis measurement.  The following velocity measurements were obtained:  PEAK SYSTOLIC/END DIASTOLIC RIGHT ICA:                        102cm/sec CCA:                        78cm/sec SYSTOLIC ICA/CCA RATIO:     1.31 DIASTOLIC ICA/CCA RATIO: ECA:                        75cm/sec  LEFT ICA:                        96cm/sec CCA:                        83cm/sec SYSTOLIC ICA/CCA RATIO:     1.15 DIASTOLIC ICA/CCA RATIO: ECA:                        101cm/sec  Findings:  RIGHT CAROTID ARTERY: Mild plaque in the bulb.  RIGHT VERTEBRAL ARTERY:  Antegrade.  LEFT CAROTID ARTERY: Mild plaque in the bulb.  LEFT VERTEBRAL ARTERY:  Antegrade.  IMPRESSION: Less than 50% stenosis in the right and left internal carotid arteries.  Original Report Authenticated By: Donavan Burnet, M.D.    Medications:  Scheduled Meds:   . amLODipine  5 mg Oral Daily  . aspirin EC  325 mg Oral Daily  . clopidogrel  75 mg Oral Daily  .  enoxaparin  40 mg Subcutaneous Q24H  . furosemide  40 mg Oral Daily  . insulin aspart  0-20 Units Subcutaneous TID WC  . insulin aspart  0-5 Units Subcutaneous QHS  . insulin aspart  6 Units Subcutaneous TID WC  . insulin glargine  65 Units Subcutaneous QHS  . metoCLOPramide  5 mg Oral QID  . metoprolol tartrate  25 mg Oral BID  . pantoprazole  40 mg Oral Daily  . rosuvastatin  40 mg Oral Daily  . DISCONTD: insulin glargine  55 Units Subcutaneous QHS   Continuous Infusions:  PRN Meds:.acetaminophen, acetaminophen, albuterol-ipratropium, nitroGLYCERIN, ondansetron (ZOFRAN) IV, senna-docusate   Assessment/Plan: See dictation.    LOS: 3 days   [name]

## 2011-10-22 NOTE — Consult Note (Signed)
[name], [name]                 ACCOUNT NO.:  [id number]  MEDICAL RECORD NO.:  [id number]  LOCATION:  A319                          FACILITY:  [facility]  PHYSICIAN:  Swanson Farnell A. Gerilyn Pilgrim, M.D. DATE OF BIRTH:  15-Sep-1951  DATE OF CONSULTATION: DATE OF DISCHARGE:                                CONSULTATION   REASON FOR CONSULTATION:  Possible stroke.  This is a 60 year old white female who presents with relatively acute onset of numbness and weakness of the right upper and lower extremity. Please see the computer generated notes for further details.  She has a long history of coronary artery disease and peripheral vascular disease, status post multiple stents and embolectomy of the left lower extremity and coronary vessels, she is on aspirin and Plavix combination therapy and also a Crestor.  She reports being compliant all of these medications.  She does have a significant history of sleep apnea, but reports noncompliance because apparently the mask causes her to gag and she has tried multiple different types of mask.  The workup was significant for elevated blood sugars in the 300-400 range.  She does report the blood sugars running in the 300 range at home.  She also is positive for low-grade UTI with wbc 7-10, rbc 7-10, bacteria few, but the epithelial cells are many.  Glucose greater than 1000 on the urinalysis.  CPK has been normal.  PHYSICAL EXAMINATION:  GENERAL:  She is awake and alert.  She is lucid and coherent.  Speech, language, and cognition are intact. HEENT EVALUATION:  Head is normocephalic, atraumatic.  She does have a large neck and large tongue base. NECK:  Supple. ABDOMEN:  Soft, but obese. EXTREMITIES:  Incisional scar, left lower extremity, medial calf area. MENTATION:  She is awake and alert, although she did complain of some dysarthria.  Per family member, she actually seems to talking well, I do not appreciate significant dysarthria, she is not aphasic,  she comprehends well.  Speech language is normal.  Cognition is fine. CRANIAL EVALUATION:  Pupils are equal, round, reactive to light and accommodation.  Extraocular movements are full.  Facial muscle strength is symmetric.  Tongue is midline.  Uvula midline.  Shoulder shrug is normal.  Visual fields are intact.  Motor examination shows downward pronator drift to right upper extremity.  Strength is 4 on triceps, deltoids, and handgrip.  Right lower extremity 4- on hip flexion and dorsiflexion is 4+.  Left upper and lower extremity shows normal tone, bulk, and strength.  Coordination shows no dysmetria.  There is no tremors.  No parkinsonism.  Reflexes are present, slightly diminished throughout.  Plantars are equivocal.  Sensation is normal to light touch and temperature.  Head CT scan of the brain is reviewed in person and shows fullness, slight hyperintensity involving the septum pellucidum.  There is hypodensity suspicious for an acute infarct involving the posterior limb of the internal capsule on the left side.  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  She does have a carotid Doppler, which shows  no hemodynamic significant stenosis.  MRI and MRA are pending.  Risk factors many including untreated sleep apnea, apparently she cannot tolerate her machine, hypertension, hypolipidemia, morbid obesity, type 2 diabetes, coronary artery disease, and peripheral vascular disease.  RECOMMENDATION:  She is already on aspirin and Plavix combination pernicious because of her stenting.  I think the dose of aspirin has been increased, which seems fine.  She is already on statin medications, blood pressure seems fine.  Blood sugar, however, is poorly controlled, this is one area which can be improved  upon.  She recently quit smoking about 10 months ago, which should help.  Additional suggestion in the weight loss and exercise as tolerated.  We will continue to follow the patient.  Thanks for this consultation.     Cris Gibby A. Gerilyn Pilgrim, M.D.     KAD/MEDQ  D:  10/21/2011  T:  10/22/2011  Job:  [id number]

## 2011-10-22 NOTE — Progress Notes (Signed)
[facility] evaluated by [facility] Ralph Dowdy rn. Notified by Lavonna Rua that bed will be available tomorrow 12/12 for inpt rehab. Notified pt and she is in agreement to go.

## 2011-10-22 NOTE — Progress Notes (Signed)
Called to evaluate patient for possible CIR admission. Patient appears appropriate for CIR. Will speak to her daughter to ensure she will have 24 hr care once home after brief CIR stay. Observed PT session. Would like to see OT notes to gauge what type of assistance she will need with ADLs. Will discuss with Rehab MD and follow up with case management regarding bed availability and 24hr care by family. Thanks for consult. Toni Amend [phone]

## 2011-10-23 ENCOUNTER — Inpatient Hospital Stay (HOSPITAL_COMMUNITY)
Admission: RE | Admit: 2011-10-23 | Discharge: 2011-11-01 | DRG: 945 | Disposition: A | Discharge status: Home Health | Payer: Medicare Other | Source: Ambulatory Visit | Attending: Physical Medicine & Rehabilitation | Admitting: Physical Medicine & Rehabilitation

## 2011-10-23 DIAGNOSIS — I6350 Cerebral infarction due to unspecified occlusion or stenosis of unspecified cerebral artery: Secondary | ICD-10-CM | POA: Diagnosis present

## 2011-10-23 DIAGNOSIS — Z5189 Encounter for other specified aftercare: Secondary | ICD-10-CM

## 2011-10-23 DIAGNOSIS — R471 Dysarthria and anarthria: Secondary | ICD-10-CM | POA: Diagnosis present

## 2011-10-23 DIAGNOSIS — E1165 Type 2 diabetes mellitus with hyperglycemia: Secondary | ICD-10-CM

## 2011-10-23 DIAGNOSIS — R42 Dizziness and giddiness: Secondary | ICD-10-CM | POA: Diagnosis present

## 2011-10-23 DIAGNOSIS — Z7902 Long term (current) use of antithrombotics/antiplatelets: Secondary | ICD-10-CM

## 2011-10-23 DIAGNOSIS — Z9861 Coronary angioplasty status: Secondary | ICD-10-CM

## 2011-10-23 DIAGNOSIS — G4733 Obstructive sleep apnea (adult) (pediatric): Secondary | ICD-10-CM | POA: Diagnosis present

## 2011-10-23 DIAGNOSIS — J4489 Other specified chronic obstructive pulmonary disease: Secondary | ICD-10-CM | POA: Diagnosis present

## 2011-10-23 DIAGNOSIS — K5900 Constipation, unspecified: Secondary | ICD-10-CM | POA: Diagnosis present

## 2011-10-23 DIAGNOSIS — Z853 Personal history of malignant neoplasm of breast: Secondary | ICD-10-CM

## 2011-10-23 DIAGNOSIS — I509 Heart failure, unspecified: Secondary | ICD-10-CM | POA: Diagnosis present

## 2011-10-23 DIAGNOSIS — I6330 Cerebral infarction due to thrombosis of unspecified cerebral artery: Secondary | ICD-10-CM

## 2011-10-23 DIAGNOSIS — IMO0002 Reserved for concepts with insufficient information to code with codable children: Secondary | ICD-10-CM | POA: Diagnosis present

## 2011-10-23 DIAGNOSIS — I5042 Chronic combined systolic (congestive) and diastolic (congestive) heart failure: Secondary | ICD-10-CM | POA: Diagnosis present

## 2011-10-23 DIAGNOSIS — Z87891 Personal history of nicotine dependence: Secondary | ICD-10-CM

## 2011-10-23 DIAGNOSIS — Z7982 Long term (current) use of aspirin: Secondary | ICD-10-CM

## 2011-10-23 DIAGNOSIS — I10 Essential (primary) hypertension: Secondary | ICD-10-CM

## 2011-10-23 DIAGNOSIS — Z794 Long term (current) use of insulin: Secondary | ICD-10-CM

## 2011-10-23 DIAGNOSIS — E785 Hyperlipidemia, unspecified: Secondary | ICD-10-CM | POA: Diagnosis present

## 2011-10-23 DIAGNOSIS — I639 Cerebral infarction, unspecified: Secondary | ICD-10-CM

## 2011-10-23 DIAGNOSIS — J449 Chronic obstructive pulmonary disease, unspecified: Secondary | ICD-10-CM | POA: Diagnosis present

## 2011-10-23 DIAGNOSIS — I2510 Atherosclerotic heart disease of native coronary artery without angina pectoris: Secondary | ICD-10-CM | POA: Diagnosis present

## 2011-10-23 LAB — GLUCOSE, CAPILLARY
Glucose-Capillary: 268 mg/dL — ABNORMAL HIGH (ref 70–99)
Glucose-Capillary: 229 mg/dL — ABNORMAL HIGH (ref 70–99)
Glucose-Capillary: 200 mg/dL — ABNORMAL HIGH (ref 70–99)
Glucose-Capillary: 196 mg/dL — ABNORMAL HIGH (ref 70–99)

## 2011-10-23 MED ORDER — INSULIN ASPART 100 UNIT/ML ~~LOC~~ SOLN
6.0000 [IU] | Freq: Three times a day (TID) | SUBCUTANEOUS | Status: DC
  Administered 2011-10-23 – 2011-10-24 (×4): 6 [IU] via SUBCUTANEOUS

## 2011-10-23 MED ORDER — PROMETHAZINE HCL 12.5 MG RE SUPP: 12.5000 mg | Freq: Four times a day (QID) | RECTAL | Status: DC | PRN

## 2011-10-23 MED ORDER — AMLODIPINE BESYLATE 5 MG PO TABS
5.0000 mg | ORAL_TABLET | Freq: Every day | ORAL | Status: DC
  Administered 2011-10-24 – 2011-11-01 (×9): 5 mg via ORAL
  Filled 2011-10-23 (×11): qty 1

## 2011-10-23 MED ORDER — CLOPIDOGREL BISULFATE 75 MG PO TABS
75.0000 mg | ORAL_TABLET | Freq: Every day | ORAL | Status: DC
  Administered 2011-10-24 – 2011-11-01 (×9): 75 mg via ORAL
  Filled 2011-10-23 (×11): qty 1

## 2011-10-23 MED ORDER — ACETAMINOPHEN 325 MG PO TABS
325.0000 mg | ORAL_TABLET | ORAL | Status: DC | PRN
  Administered 2011-10-23 – 2011-11-01 (×14): 650 mg via ORAL
  Filled 2011-10-23 (×14): qty 2

## 2011-10-23 MED ORDER — SORBITOL 70 % SOLN: 30.0000 mL | Freq: Every day | Status: DC | PRN

## 2011-10-23 MED ORDER — ENOXAPARIN SODIUM 40 MG/0.4ML ~~LOC~~ SOLN
40.0000 mg | SUBCUTANEOUS | Status: DC
  Administered 2011-10-23 – 2011-10-31 (×9): 40 mg via SUBCUTANEOUS
  Filled 2011-10-23 (×11): qty 0.4

## 2011-10-23 MED ORDER — INSULIN GLARGINE 100 UNIT/ML ~~LOC~~ SOLN
65.0000 [IU] | Freq: Every day | SUBCUTANEOUS | Status: DC
  Administered 2011-10-23 – 2011-10-24 (×2): 65 [IU] via SUBCUTANEOUS
  Filled 2011-10-23: qty 3

## 2011-10-23 MED ORDER — ROSUVASTATIN CALCIUM 40 MG PO TABS
40.0000 mg | ORAL_TABLET | Freq: Every day | ORAL | Status: DC
  Administered 2011-10-24 – 2011-10-31 (×8): 40 mg via ORAL
  Filled 2011-10-23 (×10): qty 1

## 2011-10-23 MED ORDER — NITROGLYCERIN 0.4 MG SL SUBL: 0.4000 mg | SUBLINGUAL_TABLET | SUBLINGUAL | Status: DC | PRN

## 2011-10-23 MED ORDER — METOPROLOL TARTRATE 25 MG PO TABS
25.0000 mg | ORAL_TABLET | Freq: Two times a day (BID) | ORAL | Status: DC
  Administered 2011-10-23 – 2011-11-01 (×18): 25 mg via ORAL
  Filled 2011-10-23 (×21): qty 1

## 2011-10-23 MED ORDER — IPRATROPIUM-ALBUTEROL 18-103 MCG/ACT IN AERO
2.0000 | INHALATION_SPRAY | RESPIRATORY_TRACT | Status: DC | PRN
  Filled 2011-10-23: qty 14.7

## 2011-10-23 MED ORDER — TRAZODONE HCL 50 MG PO TABS
25.0000 mg | ORAL_TABLET | Freq: Every evening | ORAL | Status: DC | PRN
  Filled 2011-10-23: qty 0.5

## 2011-10-23 MED ORDER — ZOLPIDEM TARTRATE 5 MG PO TABS
5.0000 mg | ORAL_TABLET | Freq: Every day | ORAL | Status: DC
  Administered 2011-10-23 – 2011-10-31 (×9): 5 mg via ORAL
  Filled 2011-10-23 (×9): qty 1

## 2011-10-23 MED ORDER — POLYETHYLENE GLYCOL 3350 17 G PO PACK
17.0000 g | PACK | Freq: Every day | ORAL | Status: DC | PRN
  Filled 2011-10-23: qty 1

## 2011-10-23 MED ORDER — INSULIN ASPART 100 UNIT/ML ~~LOC~~ SOLN
0.0000 [IU] | Freq: Three times a day (TID) | SUBCUTANEOUS | Status: DC
  Administered 2011-10-23: 4 [IU] via SUBCUTANEOUS
  Administered 2011-10-24: 3 [IU] via SUBCUTANEOUS
  Administered 2011-10-24: 7 [IU] via SUBCUTANEOUS
  Administered 2011-10-24: 4 [IU] via SUBCUTANEOUS
  Administered 2011-10-25 (×3): 3 [IU] via SUBCUTANEOUS
  Administered 2011-10-26 (×2): 4 [IU] via SUBCUTANEOUS
  Administered 2011-10-26: 7 [IU] via SUBCUTANEOUS
  Administered 2011-10-27: 3 [IU] via SUBCUTANEOUS
  Administered 2011-10-27: 11 [IU] via SUBCUTANEOUS
  Administered 2011-10-28 – 2011-10-29 (×2): 3 [IU] via SUBCUTANEOUS
  Administered 2011-10-29 (×2): 4 [IU] via SUBCUTANEOUS
  Administered 2011-10-30 (×2): 7 [IU] via SUBCUTANEOUS
  Administered 2011-10-30 – 2011-10-31 (×3): 3 [IU] via SUBCUTANEOUS
  Administered 2011-10-31: 7 [IU] via SUBCUTANEOUS
  Administered 2011-11-01: 4 [IU] via SUBCUTANEOUS
  Filled 2011-10-23 (×3): qty 3

## 2011-10-23 MED ORDER — PANTOPRAZOLE SODIUM 40 MG PO TBEC
40.0000 mg | DELAYED_RELEASE_TABLET | Freq: Every day | ORAL | Status: DC
  Administered 2011-10-24 – 2011-11-01 (×9): 40 mg via ORAL
  Filled 2011-10-23 (×9): qty 1

## 2011-10-23 MED ORDER — ASPIRIN EC 325 MG PO TBEC
325.0000 mg | DELAYED_RELEASE_TABLET | Freq: Every day | ORAL | Status: DC
  Administered 2011-10-24 – 2011-11-01 (×9): 325 mg via ORAL
  Filled 2011-10-23 (×10): qty 1

## 2011-10-23 MED ORDER — PROMETHAZINE HCL 12.5 MG PO TABS: 12.5000 mg | ORAL_TABLET | Freq: Four times a day (QID) | ORAL | Status: DC | PRN

## 2011-10-23 MED ORDER — METOCLOPRAMIDE HCL 5 MG PO TABS
5.0000 mg | ORAL_TABLET | Freq: Three times a day (TID) | ORAL | Status: DC
  Administered 2011-10-23 – 2011-11-01 (×37): 5 mg via ORAL
  Filled 2011-10-23 (×41): qty 1

## 2011-10-23 MED ORDER — PROMETHAZINE HCL 25 MG/ML IJ SOLN: 12.5000 mg | Freq: Four times a day (QID) | INTRAMUSCULAR | Status: DC | PRN

## 2011-10-23 MED ORDER — METOCLOPRAMIDE HCL 5 MG PO TABS
5.0000 mg | ORAL_TABLET | Freq: Four times a day (QID) | ORAL | Status: DC
  Filled 2011-10-23 (×3): qty 1

## 2011-10-23 MED ORDER — FUROSEMIDE 40 MG PO TABS
40.0000 mg | ORAL_TABLET | Freq: Every day | ORAL | Status: DC
  Administered 2011-10-24 – 2011-11-01 (×9): 40 mg via ORAL
  Filled 2011-10-23 (×11): qty 1

## 2011-10-23 NOTE — PMR Pre-admission (Signed)
PMR Admission Coordinator Pre-Admission Assessment  Patient:  [name] is an 60 y.o., female MRN:  [id number] DOB:  1951-08-20 Height: 5'3inches; Weight: 229lbs  Insurance Information:  PRIMARY:Medicare      QIONGE#:[id number]      Subscriber:patient CM Name:      Phone#:     Fax#: Pre-Cert#:      Employer:disabled Benefits:  Phone #:visionshare     Name: Eff. Date:02/09/09 A &B     Deduct:$1156      Out of Pocket Max:0      Life MWN:UUVOZ CIR:100%      SNF:LBD=05/14/11; 100 days Outpatient:80%     Co-Pay:20% Home Health:100%      Co-Pay:0% DME:80%     Co-Pay:20% Providers:patient's choice   Current Medical History:     Patient Admitting Diagnosis:  L-CVA History of Present Illness: Patient admitted on 10/19/11 to [facility] after waking up in am with R-sided weakness and facial droop. Patient has extensive cardiac history with stent placements in Juneafter MI. Patient is a diabetic with peripheral neuropathy and has obstructive sleep apnea. Work up for CVA being done which shows L-lateral thalamus and internal capsule CVA. Patients Past Medical History:   Past Medical History  Diagnosis Date  . GERD (gastroesophageal reflux disease)   . PAD (peripheral artery disease)     Stent, embolectomy, surgery  /     Dr. Darrick Penna, ABI 0.52 right, 0.54 left, February, 2010  . COPD (chronic obstructive pulmonary disease)     Dr. Craige Cotta, medications, PFTs August, 2012  . OSA (obstructive sleep apnea)     Dr. Craige Cotta  CPAP started August, 2012  . Systolic and diastolic CHF, chronic   . CAD (coronary artery disease)     June, 2012, DES to RCA, staged DES to circumflex and DES to diagonal with pressure wire to LAD, FFR 0.84  . DM (diabetes mellitus), type 2   . Hypertension   . Morbid obesity   . Hyperlipidemia   . Ejection fraction < 50%     EF 45-50%, echo, June, 2012  . Diastolic dysfunction     grade 2, echo, June, 2012  . Peripheral neuropathy   . Tobacco abuse   . Syncope    vasovagal  . Breast cancer     lumpectomy and radiation 2002  . MI, old    Family Medical History:   family history includes Hyperlipidemia in her brother and sister; Hypertension in her sister; Lung cancer in her father; and Stomach cancer in her sister. NIH Stroke scale:  not done at outside hospital Patients Current Diet: Carb Control  Prior Rehab/Hospitalizations: patient reports no prior rehab  Medications  Current Medications: Current facility-administered medications:acetaminophen (TYLENOL) suppository 650 mg, 650 mg, Rectal, Q4H PRN, Jehanzeb Memon;  acetaminophen (TYLENOL) tablet 650 mg, 650 mg, Oral, Q4H PRN, Jehanzeb Memon;  albuterol-ipratropium (COMBIVENT) inhaler 2 puff, 2 puff, Inhalation, Q3H PRN, Jehanzeb Memon;  amLODipine (NORVASC) tablet 5 mg, 5 mg, Oral, Daily, Jehanzeb Memon, 5 mg at 10/23/11 0947 aspirin EC tablet 325 mg, 325 mg, Oral, Daily, Jehanzeb Memon, 325 mg at 10/23/11 0946;  clopidogrel (PLAVIX) tablet 75 mg, 75 mg, Oral, Daily, Jehanzeb Memon, 75 mg at 10/23/11 0946;  enoxaparin (LOVENOX) injection 40 mg, 40 mg, Subcutaneous, Q24H, Jehanzeb Memon, 40 mg at 10/22/11 2246;  furosemide (LASIX) tablet 40 mg, 40 mg, Oral, Daily, Jehanzeb Memon, 40 mg at 10/23/11 0947 insulin aspart (novoLOG) injection 0-20 Units, 0-20 Units, Subcutaneous, TID WC, Jehanzeb Memon, 11  Units at 10/23/11 0817;  insulin aspart (novoLOG) injection 0-5 Units, 0-5 Units, Subcutaneous, QHS, Jehanzeb Memon, 3 Units at 10/22/11 2237;  insulin aspart (novoLOG) injection 6 Units, 6 Units, Subcutaneous, TID WC, Jehanzeb Memon, 6 Units at 10/23/11 0817 insulin glargine (LANTUS) injection 65 Units, 65 Units, Subcutaneous, QHS, Jehanzeb Memon, 65 Units at 10/22/11 2237;  metoCLOPramide (REGLAN) tablet 5 mg, 5 mg, Oral, QID, Jehanzeb Memon, 5 mg at 10/23/11 0946;  metoprolol tartrate (LOPRESSOR) tablet 25 mg, 25 mg, Oral, BID, Jehanzeb Memon, 25 mg at 10/23/11 0947;  nitroGLYCERIN (NITROSTAT) SL tablet 0.4  mg, 0.4 mg, Sublingual, Q5 min PRN, Jehanzeb Memon nystatin (NYSTOP) topical powder, , Topical, BID, Jehanzeb Memon;  ondansetron (ZOFRAN) injection 4 mg, 4 mg, Intravenous, Q6H PRN, Jehanzeb Memon;  pantoprazole (PROTONIX) EC tablet 40 mg, 40 mg, Oral, Daily, Jehanzeb Memon, 40 mg at 10/23/11 0946;  rosuvastatin (CRESTOR) tablet 40 mg, 40 mg, Oral, Daily, Jehanzeb Memon, 40 mg at 10/23/11 0946;  senna-docusate (Senokot-S) tablet 1 tablet, 1 tablet, Oral, QHS PRN, Jehanzeb Memon zolpidem (AMBIEN) tablet 5 mg, 5 mg, Oral, QHS PRN, Jehanzeb Memon, 5 mg at 10/22/11 2236  Precautions/Special Needs:   Precautions/Special Needs:  (Falls) Vision Conditions/Impairments: normally wears glasses for driving or reading; blurry with this admission Balance Conditions/Impairments: R-hemiparesis Sensory Changes Conditions/Impairments: complains of right side being numb Coordination Conditions/Impairments: very staggered movements with upper extremities Dizziness Conditions/Impairments: complained of dizziness when up to chair yesterday Other Conditions/Impairments: dysarthria  Additional Precautions/Restrictions: Precautions Precautions: Fall Precaution Comments: pt states that her balance isn't very good at baseline-tends to fall backward or forward and therefore uses a walker at home...hasn't had a fall since this past summer Required Braces or Orthoses: No Restrictions Weight Bearing Restrictions: No  Therapy Assessments Physical Therapy: Precautions Precautions: Fall Precaution Comments: pt states that her balance isn't very good at baseline-tends to fall backward or forward and therefore uses a walker at home...hasn't had a fall since this past summer Required Braces or Orthoses: No Home Living Lives With: Daughter Receives Help From: Family Type of Home: Mobile home Home Layout: One level Home Access: Stairs to enter Entrance Stairs-Rails: Right Entrance Stairs-Number of Steps: 3 Bathroom  Shower/Tub: Engineer, manufacturing systems: Standard Bathroom Accessibility: Yes How Accessible: Accessible via walker Home Adaptive Equipment: Walker - rolling;Wheelchair - manual Additional Comments: uses a w/c for outings Prior Function Level of Independence: Independent with basic ADLs;Independent with homemaking with ambulation;Independent with gait;Independent with transfers;Requires assistive device for independence Able to Take Stairs?: Yes Driving: Yes Vocation: On disability Coordination Gross Motor Movements are Fluid and Coordinated: No (Decreased GMC in right arm ) Fine Motor Movements are Fluid and Coordinated: No (Patient was able to oppose thumb to IF, LF with labored mvmt) Coordination and Movement Description: has difficulty with spatial recognition R and L Finger Nose Finger Test: Completed with right hand with very slow, discoordinated movements.  Occupational Therapy: Precautions Precautions: Fall Precaution Comments: pt states that her balance isn't very good at baseline-tends to fall backward or forward and therefore uses a walker at home...hasn't had a fall since this past summer Required Braces or Orthoses: No Home Living Lives With: Daughter Receives Help From: Family Type of Home: Mobile home Home Layout: One level Home Access: Stairs to enter Entrance Stairs-Rails: Right Entrance Stairs-Number of Steps: 3 Bathroom Shower/Tub: Engineer, manufacturing systems: Standard Bathroom Accessibility: Yes How Accessible: Accessible via walker Home Adaptive Equipment: Walker - rolling;Wheelchair - manual Additional Comments: uses a w/c for outings Prior Function  Level of Independence: Independent with basic ADLs;Independent with homemaking with ambulation;Independent with gait;Independent with transfers;Requires assistive device for independence Able to Take Stairs?: Yes Driving: Yes Vocation: On disability Coordination Gross Motor Movements are Fluid and  Coordinated: No (Decreased GMC in right arm ) Fine Motor Movements are Fluid and Coordinated: No (Patient was able to oppose thumb to IF, LF with labored mvmt) Coordination and Movement Description: has difficulty with spatial recognition R and L Finger Nose Finger Test: Completed with right hand with very slow, discoordinated movements. Restrictions Weight Bearing Restrictions: No ADL Eating/Feeding: Performed;Set up Eating/Feeding Details (indicate cue type and reason): using primarily left hand to eat, used dominant right hand to stabilize bowl. Where Assessed - Eating/Feeding: Chair Grooming: Performed;Brushing hair;Set up Grooming Details (indicate cue type and reason): Used right hand to comb hair with min-mod difficulty and then switched to non dominant left hand to complete task. Where Assessed - Grooming: Sitting, chair Lower Body Dressing: Performed;Maximal assistance Lower Body Dressing Details (indicate cue type and reason): Attempted to doff sock long sitting in recliner and bending knee.  Patient was unable to complete, will require max pa with LE dressing.  Patient states she typically does not wear socks and slides her shoes on.  She is typically able to don underwear and pants by fishing foot through leg hole.  Patient would not be able to do so this date. Where Assessed - Lower Body Dressing: Sitting, chair;Supported Statistician: Performed  SLP Recommendations: Equipment Recommended: Defer to next venue    Prior Function: Level of Independence: Independent with basic ADLs;Independent with homemaking with ambulation;Independent with gait;Independent with transfers;Requires assistive device for independence Able to Take Stairs?: Yes Driving: Yes Vocation: On disability ADL Eating/Feeding: Performed;Set up Eating/Feeding Details (indicate cue type and reason): using primarily left hand to eat, used dominant right hand to stabilize bowl. Where Assessed -  Eating/Feeding: Chair Grooming: Performed;Brushing hair;Set up Grooming Details (indicate cue type and reason): Used right hand to comb hair with min-mod difficulty and then switched to non dominant left hand to complete task. Where Assessed - Grooming: Sitting, chair Lower Body Dressing: Performed;Maximal assistance Lower Body Dressing Details (indicate cue type and reason): Attempted to doff sock long sitting in recliner and bending knee.  Patient was unable to complete, will require max pa with LE dressing.  Patient states she typically does not wear socks and slides her shoes on.  She is typically able to don underwear and pants by fishing foot through leg hole.  Patient would not be able to do so this date. Where Assessed - Lower Body Dressing: Sitting, chair;Supported Statistician: Performed  Additional Prior Functional Levels:  Bed Mobility: Independent Transfers: Independent Mobility - Walk/Wheelchair: used walker for home to car and longer distrances Upper Body Dressing: indpendent Lower Body Dressing: independent Grooming: independent Eating/Drinking: independent Toilet Transfer: independent Bladder Continence: independent Bowel Management: independent Stair Climbing: independent Communication: WNL Memory: WNL Cooking/Meal Prep: independent Housework: light Money Management: independnet Driving: independent  Prior Activity Level: Limited Community (1-2x/wk): dtrs states her mother has been a little sedentary since this summer  ADLs/Mobility: ADL Eating/Feeding: Performed;Set up Eating/Feeding Details (indicate cue type and reason): using primarily left hand to eat, used dominant right hand to stabilize bowl. Where Assessed - Eating/Feeding: Chair Grooming: Performed;Brushing hair;Set up Grooming Details (indicate cue type and reason): Used right hand to comb hair with min-mod difficulty and then switched to non dominant left hand to complete task. Where Assessed -  Grooming: Sitting, chair  Lower Body Dressing: Performed;Maximal assistance Lower Body Dressing Details (indicate cue type and reason): Attempted to doff sock long sitting in recliner and bending knee.  Patient was unable to complete, will require max pa with LE dressing.  Patient states she typically does not wear socks and slides her shoes on.  She is typically able to don underwear and pants by fishing foot through leg hole.  Patient would not be able to do so this date. Where Assessed - Lower Body Dressing: Sitting, chair;Supported Statistician: Performed  Bed Mobility Bed Mobility: Yes Rolling Right: 7: Independent;With rail Right Sidelying to Sit: 3: Mod assist;With rails Right Sidelying to Sit Details (indicate cue type and reason): has difficulty pushing up with UEs to achieve sitting...needed assist of trunk to sit erectly...she c/o some lightheadedness with sitting Sit to Supine - Right: 6: Modified independent (Device/Increase time) Sit to Supine - Left: HOB elevated (comment degrees);4: Min assist Sit to Supine - Left Details (indicate cue type and reason): HOB elevated 50 degrees;slight assistance needed advancing RLE to EOB Transfers Transfers: Yes Sit to Stand: 5: Supervision Sit to Stand Details (indicate cue type and reason): cues for proper hand placement, cues to increase base of support Stand to Sit: 4: Min assist Stand to Sit Details: verbal cues for reaching back to surface and assistance with controlling descent Anterior-Posterior Transfer: 4: Min Forensic psychologist Details (indicate cue type and reason): verbal cues to widen BOS and sequencing of gait/RW technique Ambulation/Gait Ambulation/Gait: Yes (3' fwd/retro-rest break x3) Ambulation/Gait Assistance: 4: Min assist;3: Mod assist Ambulation/Gait Assistance Details (indicate cue type and reason): verbal cues to widen BOS and use of UE on RW to assist with weakness of RLE Ambulation Distance  (Feet): 9 Feet (3' fwd/retro-rest break x3) Assistive device: Rolling walker Gait Pattern: Step-through pattern;Lateral trunk lean to right;Decreased stride length Gait velocity: slow Stairs: No Wheelchair Mobility Wheelchair Mobility: No Posture/Postural Control Posture/Postural Control: No significant limitations Balance Balance Assessed: Yes Static Sitting Balance Static Sitting - Level of Assistance: 7: Independent Dynamic Sitting Balance Reach (Patient is able to reach ___ inches to right, left, forward, back): able to reach 6" L and 3" R  Home Assistive Devices/Equipment:  Home Assistive Devices/Equipment Home Assistive Devices/Equipment: Environmental consultant (specify type)  Discharge Planning:  Living Arrangements: Children;Family members Support Systems: Family members;Friends/neighbors Assistance Needed: will need at least supervision at d/c Do you have any problems obtaining your medications?: No Type of Residence: Private residence Home Care Services: No Patient expects to be discharged to:: home (home) Expected Discharge Date:  (to be determined) Case Management Consult Needed: No  Current Functional Levels:  Bed Mobility: see therapy notes Transfers: see therapy notes Mobility - Walk/Wheelchair: see therapy notes Upper Body Dressing: see therapy notes Lower Body Dressing: see therapy notes Grooming: see therapy notes Eating/Drinking: see therapy notes Toilet Transfer: see therapy notes Bladder Continence: wears diapers for incontinence Bowel Management: wears diapers for incontinence Stair Climbing: not attempted Communication: slight dysarthria Memory: WNL Previous Home Environment:  Living Arrangements: Children;Family members Support Systems: Family members;Friends/neighbors Assistance Needed: will need at least supervision at d/c Do you have any problems obtaining your medications?: No Type of Residence: Private residence Home Care Services: No Patient expects to be  discharged to:: home (home) Expected Discharge Date:  (to be determined) Discharge Living Setting:  Plans for Discharge Living Setting: Mobile Home;Lives with (comment) (daughter) Discharge Living Setting Number of Levels: 1 Discharge Living Setting Number of Steps: 3 Discharge Living Setting is Bedroom  on Main Floor?: Yes Discharge Living Setting is Bathroom on Main Floor?: Yes  Social/Family/Support Systems:  Patient Roles: Parent Anticipated Caregiver: daughterDarnelle Maffucci [phone] cell, home [phone] Ability/Limitations of Caregiver: can provide care Caregiver Availability: 24/7 Discharge Plan Discussed with Primary Caregiver: Yes Is Caregiver In Agreement with Plan?: Yes Does Caregiver/Family have Issues with Lodging/Transportation while Pt is in Rehab?: No  Goals/Additional Needs:  Patient/Family Goal for Rehab: to be as independent as possible Cultural Considerations: none Dietary Needs: Carb modified Equipment Needs: to be determined Pt/Family Agrees to Admission and willing to participate: Yes Program Orientation Provided & Reviewed with Pt/Caregiver Including Roles  & Responsibilities: Yes  Preadmission Screen Completed By:  Oletta Darter, 10/23/2011 9:59 AM  Patient's condition:  Patient was evaluated by Toni Amend and discussed with MD prior to admission and deemed appropriate.  Preadmission Screen Competed AO:ZHYQM Thomas,RN, Time/Date,10:18am on 10/23/11.  Discussed status with Dr. Riley Kill on 10/23/11 at 0900am (time/date) and received telephone approval for admission today.  Admission Coordinator:  Oletta Darter, time 10:18am /Date12/12/12

## 2011-10-23 NOTE — Progress Notes (Signed)
  CARE MANAGEMENT NOTE 10/23/2011  Patient:  [name], [name]   Account Number:  [id number]  Date Initiated:  10/21/2011  Documentation initiated by:  Andi Devon Assessment:   60 yr old female with new cva lives at home with her daughter Physicial thearpy states pt is candidate for inpt rehab.  [facility] sheri she will evaluate pt tomorrow     Action/Plan:   Anticipated DC Date:  10/23/2011   Anticipated DC Plan:  IP REHAB FACILITY      DC Planning Services  CM consult      Choice offered to / List presented to:             Status of service:   Medicare Important Message given?   (If response is "NO", the following Medicare IM given date fields will be blank) Date Medicare IM given:   Date Additional Medicare IM given:    Discharge Disposition:  IP REHAB FACILITY  Per UR Regulation:    Comments:  10/23/2011 Mendel Corning rn bsn notified by Ralph Dowdy rn of [facility] of bed availablility today. notified patient and md and family. arrangements complete for pt transfer to  rehab by [facility] today. room 4005.notified pts nurse tina.

## 2011-10-23 NOTE — Progress Notes (Signed)
PIV removed without complaint. Patient transported by [facility] to [facility], report called to [employer]. Stacy, Daughter, notified of transfer.

## 2011-10-23 NOTE — Progress Notes (Signed)
[name], [name]                 ACCOUNT NO.:  [id number]  MEDICAL RECORD NO.:  [id number]  LOCATION:  A319                          FACILITY:  [facility]  PHYSICIAN:  Nathalie Cavendish A. Gerilyn Pilgrim, M.D. DATE OF BIRTH:  1950-12-24  DATE OF PROCEDURE: DATE OF DISCHARGE:                                PROGRESS NOTE   HISTORY:  The patient reports that she still has right-sided weakness. She reports difficulty standing aside.  Physical Therapy did tried to stand her up, but she has had difficulties there.  PHYSICAL EXAMINATION:  Please see my generated computer notes.  Further in detail today, GENERAL:  She is awake, alert.  She is lucid and coherent.  Speech, coordination and cognition are intact. HEENT:  Evaluation shows neck is supple.  Head is normocephalic, atraumatic. ABDOMEN:  Obese, but soft. CRANIAL NERVES:  Evaluation, pupils are reactive.  Extraocular movements full.  Facial muscle strength symmetric.  Motor examination again shows downward drift mild right upper extremity.  Strength is actually improved today, it is 4+.  Strength in the right leg is also improved to 3-4 minus.  MRI is reviewed in person shows acute infarct with increasing on diffusion imaging.  The left thalamus and posterior limb of the internal capsule on the left side, there is a more subacute infarct also seen in diffusion imaging involving the left occipital area.  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  Again, there is fullness seen in the frontal area at the proximal of the septum pellucidum.  The outside area is isodense and in that area seems to be CSF density, unclear what that is, but apparently is stable since 2009.  Possibly cystic structure, possibly low-grade tumor and raised by the radiologist possibly meningioma.  ASSESSMENT/PLAN:  The patient appears to have had 2 strokes in approximately for the last 2 weeks, more recent one involving the left thalamus and another one involving the  left occipital area.  Risk factors, again hypertension, diabetes, which is very poorly controlled. Hyperlipidemia, peripheral vascular disease, obesity and nicotine use. She has to discontinue the nicotine use.  She has issues with poorly controlled diabetes.  For now, we will continue with both aspirin and Plavix combination, continue with Crestor.  She can continue with aspirin and Plavix combination until deemed necessary about by her stents placed a few months ago, afterwards a single antiplatelet agent either aspirin or Plavix should be fine.  Further risk factor reduction including again diabetes control, weight loss and use of her CPAP machine if possible.  The MRA done shows intracranial occlusive disease, particularly involved the siphon of the carotid, however, I think medical management is a way to go given recent data from control trials.     Jadis Pitter A. Gerilyn Pilgrim, M.D.     KAD/MEDQ  D:  10/22/2011  T:  10/23/2011  Job:  [id number]

## 2011-10-23 NOTE — H&P (Addendum)
Physical Medicine and Rehabilitation Admission H&P  [name] is an 60 y.o. female.   No chief complaint on file. : HPI: 60 year old right-handed female with history of coronary artery disease congestive heart failure as well as uncontrolled diabetes mellitus. Admitted [facility] hospital on December 8 with right-sided weakness and numbness as well as slurred speech. 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 It was also a subacute-appearing left inferior PCA territory infarct in the left occipital lobe. MRA of the head with no major occlusion. Carotid Doppler showed less than 50% stenosis bilateral internal carotid arteries. Echocardiogram ejection fraction 55% grade 2 diastolic dysfunction. Neurology followup maintained on aspirin and Plavix therapy as prior to hospital admission for history of cardiac stents. Subcutaneous Lovenox was added for deep vein thrombosis prophylaxis. Noted hemoglobin A1c of 12.3 and her Lantus insulin was adjusted. She currently requires minimal assistance to moderate assistance to ambulate 9 feet with a rolling walker with limited endurance. Physical medicine and rehabilitation were consulted for inpatient rehabilitation services and it was ultimately decided that the patient was appropriate for this level of care.  Review of Systems  Constitutional: Positive for malaise/fatigue.  Eyes: Negative for double vision.  Respiratory: Positive for shortness of breath. Negative for cough.   Cardiovascular: Negative for chest pain.  Gastrointestinal: Positive for constipation. Negative for nausea.  Genitourinary: Negative for dysuria.  Musculoskeletal: Negative.   Skin: Negative.   Neurological: Positive for dizziness and headaches. Negative for seizures.  Psychiatric/Behavioral: Negative for depression.   Past Medical History  Diagnosis Date  . GERD (gastroesophageal reflux disease)   . PAD (peripheral artery disease)     Stent,  embolectomy, surgery  /     Dr. Darrick Penna, ABI 0.52 right, 0.54 left, February, 2010  . COPD (chronic obstructive pulmonary disease)     Dr. Craige Cotta, medications, PFTs August, 2012  . OSA (obstructive sleep apnea)     Dr. Craige Cotta  CPAP started August, 2012  . Systolic and diastolic CHF, chronic   . CAD (coronary artery disease)     June, 2012, DES to RCA, staged DES to circumflex and DES to diagonal with pressure wire to LAD, FFR 0.84  . DM (diabetes mellitus), type 2   . Hypertension   . Morbid obesity   . Hyperlipidemia   . Ejection fraction < 50%     EF 45-50%, echo, June, 2012  . Diastolic dysfunction     grade 2, echo, June, 2012  . Peripheral neuropathy   . Tobacco abuse   . Syncope     vasovagal  . Breast cancer     lumpectomy and radiation 2002  . MI, old    Past Surgical History  Procedure Date  . Appendectomy   . Cholecystectomy   . Tubal ligation   . Breast surgery     Left breast cancer  . Cardiac catheterization 2002  . Popliteal artery angioplasty 11/30/10    Left leg w/ embolectomy, endarterectomy  by Dr.Charles Fields  . Coronary angioplasty with stent placement    Family History  Problem Relation Age of Onset  . Hypertension Sister   . Hyperlipidemia Sister   . Hyperlipidemia Brother   . Lung cancer Father   . Stomach cancer Sister    Social History:  reports that she quit smoking about 10 months ago. Her smoking use included Cigarettes. She has a 66 pack-year smoking history. She has never used smokeless tobacco. She reports that she does not drink  alcohol or use illicit drugs. Allergies: No Known Allergies Medications Prior to Admission  Medication Dose Route Frequency Provider Last Rate Last Dose  . DISCONTD: acetaminophen (TYLENOL) suppository 650 mg  650 mg Rectal Q4H PRN Jehanzeb Memon      . DISCONTD: acetaminophen (TYLENOL) tablet 650 mg  650 mg Oral Q4H PRN Jehanzeb Memon      . DISCONTD: albuterol-ipratropium (COMBIVENT) inhaler 2 puff  2 puff  Inhalation Q3H PRN Jehanzeb Memon      . DISCONTD: amLODipine (NORVASC) tablet 5 mg  5 mg Oral Daily Jehanzeb Memon   5 mg at 10/23/11 0947  . DISCONTD: aspirin EC tablet 325 mg  325 mg Oral Daily Jehanzeb Memon   325 mg at 10/23/11 0946  . DISCONTD: clopidogrel (PLAVIX) tablet 75 mg  75 mg Oral Daily Jehanzeb Memon   75 mg at 10/23/11 0946  . DISCONTD: enoxaparin (LOVENOX) injection 40 mg  40 mg Subcutaneous Q24H Jehanzeb Memon   40 mg at 10/22/11 2246  . DISCONTD: furosemide (LASIX) tablet 40 mg  40 mg Oral Daily Jehanzeb Memon   40 mg at 10/23/11 0947  . DISCONTD: insulin aspart (novoLOG) injection 0-20 Units  0-20 Units Subcutaneous TID WC Jehanzeb Memon   7 Units at 10/23/11 1137  . DISCONTD: insulin aspart (novoLOG) injection 0-5 Units  0-5 Units Subcutaneous QHS Jehanzeb Memon   3 Units at 10/22/11 2237  . DISCONTD: insulin aspart (novoLOG) injection 6 Units  6 Units Subcutaneous TID WC Jehanzeb Memon   6 Units at 10/23/11 1136  . DISCONTD: insulin glargine (LANTUS) injection 65 Units  65 Units Subcutaneous QHS Jehanzeb Memon   65 Units at 10/22/11 2237  . DISCONTD: metoCLOPramide (REGLAN) tablet 5 mg  5 mg Oral QID Jehanzeb Memon   5 mg at 10/23/11 0946  . DISCONTD: metoprolol tartrate (LOPRESSOR) tablet 25 mg  25 mg Oral BID Jehanzeb Memon   25 mg at 10/23/11 0947  . DISCONTD: nitroGLYCERIN (NITROSTAT) SL tablet 0.4 mg  0.4 mg Sublingual Q5 min PRN Jehanzeb Memon      . DISCONTD: nystatin (NYSTOP) topical powder   Topical BID Jehanzeb Memon      . DISCONTD: ondansetron (ZOFRAN) injection 4 mg  4 mg Intravenous Q6H PRN Jehanzeb Memon      . DISCONTD: pantoprazole (PROTONIX) EC tablet 40 mg  40 mg Oral Daily Jehanzeb Memon   40 mg at 10/23/11 0946  . DISCONTD: rosuvastatin (CRESTOR) tablet 40 mg  40 mg Oral Daily Jehanzeb Memon   40 mg at 10/23/11 0946  . DISCONTD: senna-docusate (Senokot-S) tablet 1 tablet  1 tablet Oral QHS PRN Jehanzeb Memon      . DISCONTD: zolpidem (AMBIEN) tablet 5 mg   5 mg Oral QHS PRN Jehanzeb Memon   5 mg at 10/22/11 2236   Medications Prior to Admission  Medication Sig Dispense Refill  . albuterol-ipratropium (COMBIVENT) 18-103 MCG/ACT inhaler Inhale 2 puffs into the lungs every 3 (three) hours as needed.       Marland Kitchen amLODipine (NORVASC) 5 MG tablet Take 5 mg by mouth daily.       Marland Kitchen aspirin 81 MG tablet Take 81 mg by mouth daily.        . clindamycin (CLEOCIN) 150 MG capsule Take 150 mg by mouth daily as needed. When patient feels that cellulitis in foot is going to flare up.       . clopidogrel (PLAVIX) 75 MG tablet Take 75 mg by mouth daily.        Marland Kitchen  furosemide (LASIX) 40 MG tablet Take 40 mg by mouth daily.        . insulin glargine (LANTUS) 100 UNIT/ML injection Inject 48 Units into the skin daily.        . insulin regular (HUMULIN R,NOVOLIN R) 100 units/mL injection Inject into the skin as directed. Per sliding scale      . metoCLOPramide (REGLAN) 5 MG tablet Take 5 mg by mouth 4 (four) times daily.        . metoprolol tartrate (LOPRESSOR) 25 MG tablet Take 25 mg by mouth 2 (two) times daily.        . nitroGLYCERIN (NITROSTAT) 0.4 MG SL tablet Place 1 tablet (0.4 mg total) under the tongue every 5 (five) minutes as needed for chest pain.  25 tablet  3  . ondansetron (ZOFRAN) 4 MG tablet Take 4 mg by mouth every 8 (eight) hours as needed. Nausea       . pantoprazole (PROTONIX) 40 MG tablet Take 40 mg by mouth daily.        . rosuvastatin (CRESTOR) 40 MG tablet Take 40 mg by mouth daily.          Home: Lives with her daughter at eat in [place]. Daughter provides 24 hour assistance is needed. The patient is on disability. She was independent prior to admission using a walker. Patient still drives     Functional History:   Independent prior to admission using a walker. Patient still drives. She was independent with basic activities of daily living and simple meal preparation.  Functional Status:  Mobility: Prior to admission to the rehabilitation  center she was ambulating 4 feet with a rolling walker and minimal assistance with limited endurance. She required minimum to moderate assist for transfers          ADL: She required minimal to moderate assist for activities of daily living as this was variable due to her endurance and fatigue factors    Cognition:     Not documented   136/68 P92 RR18 T98.3 Physical Exam  Constitutional: She is oriented to person, place, and time. She appears well-developed.       Obese  HENT:  Head: Normocephalic.       A dentulous  Neck: Normal range of motion. Neck supple. No thyromegaly present.  Cardiovascular: Normal rate and regular rhythm.   Pulmonary/Chest: Effort normal. She has no wheezes.  Abdominal: She exhibits no distension. There is no tenderness.       obese  Musculoskeletal: She exhibits no edema.  Neurological: She is alert and oriented to person, place, and time.       Mild dysarthria. Decreased visual acuity but no focal visual deficits. No nystagmus is seen. She has diminished fine motor coordination of right arm and right leg. Positive pronator drift is seen on the right. Sensory exam is 1+ out of 2 right arm and right leg. Strength right upper extremity is grossly 4/5 proximal distal. Right lower extremity strength is 3/5 proximal to 4/5 distally. Left upper lower extremity strength are 5 out of 5 grossly. Cognitively patient is alert and appropriate with good insight and awareness.  Skin: Skin is warm and dry.  Psychiatric: She has a normal mood and affect.    Results for orders placed during the hospital encounter of 10/19/11 (from the past 48 hour(s))  GLUCOSE, CAPILLARY     Status: Abnormal   Collection Time   10/21/11  4:08 PM      Component  Value Range Comment   Glucose-Capillary 286 (*) 70 - 99 (mg/dL)    Comment 1 Notify RN      Comment 2 Documented in Chart     GLUCOSE, CAPILLARY     Status: Abnormal   Collection Time   10/21/11 10:42 PM      Component  Value Range Comment   Glucose-Capillary 314 (*) 70 - 99 (mg/dL)   GLUCOSE, CAPILLARY     Status: Abnormal   Collection Time   10/22/11  7:48 AM      Component Value Range Comment   Glucose-Capillary 306 (*) 70 - 99 (mg/dL)   GLUCOSE, CAPILLARY     Status: Abnormal   Collection Time   10/22/11 11:21 AM      Component Value Range Comment   Glucose-Capillary 433 (*) 70 - 99 (mg/dL)   GLUCOSE, CAPILLARY     Status: Abnormal   Collection Time   10/22/11  5:16 PM      Component Value Range Comment   Glucose-Capillary 203 (*) 70 - 99 (mg/dL)   GLUCOSE, CAPILLARY     Status: Abnormal   Collection Time   10/22/11  9:23 PM      Component Value Range Comment   Glucose-Capillary 260 (*) 70 - 99 (mg/dL)    Comment 1 Notify RN     GLUCOSE, CAPILLARY     Status: Abnormal   Collection Time   10/23/11  7:48 AM      Component Value Range Comment   Glucose-Capillary 268 (*) 70 - 99 (mg/dL)   GLUCOSE, CAPILLARY     Status: Abnormal   Collection Time   10/23/11 11:34 AM      Component Value Range Comment   Glucose-Capillary 229 (*) 70 - 99 (mg/dL)    No results found.  Post Admission Physician Evaluation: 1. Functional deficits secondary  To left thalamus and internal capsule strokes, subacute left inferior PCA territory infarct in the left occipital lobe 2. Patient is admitted to receive collaborative, interdisciplinary care between the physiatrist, rehab nursing staff, and therapy team. 3. Patient's level of medical complexity and substantial therapy needs in context of that medical necessity cannot be provided at a lesser intensity of care such as a SNF. 4. Patient has experienced substantial functional loss from his/her baseline which was documented above under the "Functional History" and "Functional Status" headings.  Judging by the patient's diagnosis, physical exam, and functional history, the patient has potential for functional progress which will result in measurable gains while on  inpatient rehab.  These gains will be of substantial and practical use upon discharge  in facilitating mobility and self-care at the household level. 5. Physiatrist will provide 24 hour management of medical needs as well as oversight of the therapy plan/treatment and provide guidance as appropriate regarding the interaction of the two. 6. 24 hour rehab nursing will assist with bladder management, bowel management, safety, skin/wound care, disease management, medication administration and patient education  and help integrate therapy concepts, techniques,education, etc. 7. PT will assess and treat for: Balance gait, neuromuscular reeducation, adaptive equipment training cognitive visual spatial awareness and transfers. Goals are modified independent to occasional supervision. 8. OT will assess and treat for: Upper extremity strength, fine motor coordination, neuromuscular education, adaptive equipment training, ADLs, safety,. Goals are modified independent to contact guard assistance.  9. SLP will assess and treat for: speech.  Goals are: independent. 10. Case Management and Social Worker will assess and treat for psychological issues and discharge  planning. 11. Team conference will be held weekly to assess progress toward goals and to determine barriers to discharge. 12.  Patient will receive at least 3 hours of therapy per day at least 5 days per week. 13. ELOS and Prognosis: 7-10 days excellent   Medical Problem List and Plan: 1. Left lateral thalamus and internal capsule infarction as well as subacute left inferior PCA territory infarction left occipital lobe.PT/OT eval and treat.  2. DVT Prophylaxis/Anticoagulation: Subcutaneous Lovenox as well as support hose. Monitor for any signs of deep vein thrombosis. Monitor platelet counts closely and any bleeding episodes.  3. Uncontrolled diabetes mellitus-hemoglobin A1c of 12.3. Continue Lantus insulin and NovoLog as directed. Check blood sugars a.c.  and at bedtime and adjust insulin as needed. Provide full diabetic education. It sounds as if sugars were poorly controlled before  4. Hypertension-Norvasc 5 mg daily/Lopressor 25 mg twice daily/Lasix 40 mg daily. Monitor closely with increased activity.  Secondary education regarding blood pressure control.  5. Coronary artery disease with PTCA-continue aspirin therapy as well as Plavix. Patient no current complaints of chest pain and monitor his therapy progresses.  6. COPD/OSA. Continue Combivent inhaler. Check oxygen saturations every shift and applied 2 L oxygen saturations less than 90%.  7. Hyperlipidemia-Crestor  8. Bladder: Patient with baseline incontinence. Place patient on timed voiding schedule. Check PVRs. Catheterize as needed. White the anatomic and neurological components to her incontinence. Check urinalysis and culture of gonorrhea done    10/23/2011, 2:12 PM

## 2011-10-23 NOTE — Progress Notes (Signed)
Overall Plan of Care [facility]) Patient Details Name: [name] MRN: [id number] DOB: Jun 18, 1951  Diagnosis:  Left thalamic and internal capsule infarcts  Primary Diagnosis:    CVA (cerebral infarction) Co-morbidities: Hypertension, diabetes, COPD, coronary artery disease  Functional Problem List  Patient demonstrates impairments in the following areas: Balance, Bladder, Bowel, Edema, Endurance, Medication Management, Motor, Pain, Perception, Safety, Sensory , Skin Integrity and Vision  Basic ADL's: eating, grooming, bathing, dressing and toileting Advanced ADL's: simple meal preparation  Transfers:  bed mobility, bed to chair, toilet, tub/shower, car and furniture Locomotion:  ambulation, wheelchair mobility and stairs  Additional Impairments:  Communication  expression  Anticipated Outcomes Item Anticipated Outcome  Eating/Swallowing  Independence, set up for opening containers  Basic self-care  supervision  Tolieting  supervision  Bowel/Bladder  Modified independence with changing depends, incontinent little over 1 year  Transfers  Mod I  Locomotion  Mod I  Communication  Mod I  Cognition  Mod I  Pain  5 or less on scale 1-10  Safety/Judgment  supervision  Other     Therapy Plan:         Team Interventions: Item RN PT OT SLP SW TR Other  Self Care/Advanced ADL Retraining   x      Neuromuscular Re-Education  x x      Therapeutic Activities  x x x  x   UE/LE Strength Training/ROM  x x   x   UE/LE Coordination Activities  x x   x   Visual/Perceptual Remediation/Compensation   x      DME/Adaptive Equipment Instruction  x x   x   Therapeutic Exercise  x x   x   Balance/Vestibular Training  x x   x   Patient/Family Education x x x x  x   Cognitive Remediation/Compensation  x x x  x   Functional Mobility Training  x x   x   Ambulation/Gait Training  x       Stair Training  x       Wheelchair Propulsion/Positioning  x       Functional Technical brewer Reintegration      x   Dysphagia/Aspiration Landscape architect Facilitation    x     Bladder Management x        Bowel Management x        Disease Management/Prevention x        Pain Management x x       Medication Management x        Skin Care/Wound Management x        Splinting/Orthotics         Discharge Planning   x  x x   Psychosocial Support     x x                      Team Discharge Planning: Destination:  Home Projected Follow-up:  PT, OT and Outpatient Projected Equipment Needs:  Tub Bench Patient/family involved in discharge planning:  Yes  MD ELOS: 10-14 days Medical Rehab Prognosis:  Excellent Assessment: Patient has been admitted to inpatient rehabilitation for therapies which will consist of PT OT and speech. Therapies will run at least 3 hours a day at least 5 days a week with the focus on upper and lower chimneys coordination and neuromuscular reeducation, balance training, functional mobility,  self-care and ADLs, adaptive equipment training, safety education. Goals overall as supervision to modified independent level.

## 2011-10-23 NOTE — Progress Notes (Signed)
Patient ID: [name], female   DOB: 09/03/1951, 60 y.o.   MRN: [id number]   Pt admitted to [facility].  Pt is alert and oriented times four with right sided weakness.  Pt oriented to Rehab Unit, Room, Safety Plan discussed along with the Rehab specific services.  Pt verbalized an understanding. Pt resting in bed  With call bell in reach and bed alarm on [name]

## 2011-10-23 NOTE — Progress Notes (Signed)
Subjective: Patient seen and examined this morning. denies any symptoms. No overnight issues.  Objective:  Vital signs in last 24 hours:  Filed Vitals:   10/22/11 1754 10/22/11 2120 10/23/11 0531 10/23/11 1024  BP: 160/80 151/86 153/94 146/68  Pulse: 96 105 92 92  Temp: 98.3 F (36.8 C) 99 F (37.2 C) 98.5 F (36.9 C) 98.3 F (36.8 C)  TempSrc: Oral   Oral  Resp: 22 20 20 18   Height:      Weight:      SpO2: 96% 97% 100% 100%    Intake/Output from previous day:   Intake/Output Summary (Last 24 hours) at 10/23/11 1032 Last data filed at 10/23/11 0851  Gross per 24 hour  Intake    540 ml  Output      0 ml  Net    540 ml    Physical Exam:  Elderly female lying in bed in NAD HEENT: no pallor, moist oral mucosa, no LAD chest : clear to auscultation b/l, no added sounds  CVS: N S1 &S2, no murmurs rubs or gallop Abd: soft, NT, ND, BS+ Ext: warm, no edema  CNS: AAOX3, 4+ Power over RLE. 5/5 power in all extremities.   Lab Results:  Basic Metabolic Panel:    Component Value Date/Time   NA 138 10/21/2011 0515   K 3.4* 10/21/2011 0515   CL 103 10/21/2011 0515   CO2 28 10/21/2011 0515   BUN 16 10/21/2011 0515   CREATININE 0.90 10/21/2011 0515   GLUCOSE 369* 10/21/2011 0515   CALCIUM 8.2* 10/21/2011 0515   CBC:    Component Value Date/Time   WBC 7.8 10/21/2011 0515   HGB 11.2* 10/21/2011 0515   HCT 33.8* 10/21/2011 0515   PLT 240 10/21/2011 0515   MCV 87.8 10/21/2011 0515   NEUTROABS 9.2* 10/19/2011 1620   LYMPHSABS 1.1 10/19/2011 1620   MONOABS 0.7 10/19/2011 1620   EOSABS 0.1 10/19/2011 1620   BASOSABS 0.0 10/19/2011 1620    No results found for this or any previous visit (from the past 240 hour(s)).  Studies/Results: Mr Angiogram Head Wo Contrast  10/21/2011  *RADIOLOGY REPORT*  Clinical Data: 60 year old female with right side arm and leg weakness and speech changes.  History of breast cancer.  Possible stroke.  Comparison:  [facility]  Head CT without contrast 02/14/2008.  [facility] Head CT without contrast 10/19/2011.  MRI HEAD WITHOUT CONTRAST  Technique: Multiplanar, multiecho pulse sequences of the brain and surrounding structures were obtained according to standard protocol without intravenous contrast.  Findings: There is a globular acute infarct centered at the left deep gray matter nuclei involving the posterior limb of the left internal capsule and left thalamus measuring 15 mm.  There is associated T2 and FLAIR hyperintensity.  No associated acute hemorrhage or mass effect.  There also appears to be a patchy area of restricted diffusion in the inferior left medial occipital lobe (series 2 image 8) with associated T2 and FLAIR hyperintensity but no mass effect or definite hemorrhage.  This might be more subacute.  Diffusion elsewhere is within normal limits. Major intracranial vascular flow voids are preserved, see MRA findings below.   T2 heterogeneous lesion associated with the interhemispheric fissure at or near the left foramen of Monro has signal loss on T2* imaging and is isointense on T1 (series 6 image 47). This measures 12 x 13 x 8 mm (AP by transverse by CC).  This appears to be just anterior to the columns  of the fornix, rather than posterior as expected for colloid cyst.  There is no associated ventriculomegaly or ventricular signal abnormality.  No second similar lesion is identified.  No other intracranial mass lesion.  Patchy periventricular T2 hyperintensity.  Negative pituitary, cervicomedullary junction, and cerebellum.  Patchy white matter T2 and FLAIR hyperintensity in the pons. Visualized bone marrow signal is within normal limits.  Visualized paranasal sinuses and mastoids are clear.  Visualized orbit soft tissues are within normal limits.  Visualized scalp soft tissues are within normal limits.  IMPRESSION: 1.  Acute infarct (15 mm diameter) centered at the left lateral thalamus and internal capsule. No  mass effect or associated hemorrhage. 2.  Subacute appearing left inferior PCA territory infarct in the left occipital lobe.  No mass effect or hemorrhage. 3. Chronic rounded mass at or near the left foramen of Monro with blood products or mineralization is stable since 2009 and measures 12 x 13 x 8 mm. This is not typical of a colloid cyst. I favor a small benign neoplasm (possibly a meningioma given T2* characteristics, central neurocytoma is another consideration). Aneurysm is not favored (see MRA below). Recommend repeat pre and post contrast brain MRI (ideally when the patient can better tolerate with less motion artifact). 4.  MRA findings are below.  MRA HEAD WITHOUT CONTRAST  Technique: Angiographic images of the Circle of Willis were obtained using MRA technique without  intravenous contrast.  Findings: Intermittently degraded by motion.  Antegrade flow in the posterior circulation with codominant distal vertebral arteries.  Basilar artery irregularity without hemodynamically significant stenosis.  PCA origins are patent. There does appear to be asymmetrically decreased distal left PCA flow, but without focal high-grade stenosis.  The right posterior communicating artery is present.  Antegrade flow in both ICA siphons.  Signal loss in the vertical petrous and cavernous segments is favored to reflect atherosclerosis and genu and stenosis rather than an artifact. Subsequently there are bilateral high-grade ICA stenoses, worse on the right (series 7 image 10).  The supraclinoid ICA is and ICA termini remain patent.  MCA and ACA origins are patent.  Anterior communicating artery appears diminutive, normal.  Visualized ACA branches are within normal limits and not in proximity to the left foramen of Monro lesion described above.  The right MCA M1 segment is within normal limits.  There is early right MCA branching.  No major right MCA branch occlusion is evident.  The left MCA M1 segment is irregular with mildly  decreased left MCA flow compared to the right.  No major left MCA branch occlusion is identified.  IMPRESSION: 1.  Degraded by motion.  Left MCA irregularity but no major left MCA branch occlusion. 2.  High-grade tandem bilateral ICA siphon stenoses, worse on the right. 3.  ACA branches and anterior communicating artery appear within normal limits and not in proximity to the left foramen of Monro area lesion described on the MRI.  Original Report Authenticated By: Harley Hallmark, M.D.   Mr Brain Wo Contrast  10/21/2011  *RADIOLOGY REPORT*  Clinical Data: 60 year old female with right side arm and leg weakness and speech changes.  History of breast cancer.  Possible stroke.  Comparison:  [facility] Head CT without contrast 02/14/2008.  [facility] Head CT without contrast 10/19/2011.  MRI HEAD WITHOUT CONTRAST  Technique: Multiplanar, multiecho pulse sequences of the brain and surrounding structures were obtained according to standard protocol without intravenous contrast.  Findings: There is a globular acute infarct centered  at the left deep gray matter nuclei involving the posterior limb of the left internal capsule and left thalamus measuring 15 mm.  There is associated T2 and FLAIR hyperintensity.  No associated acute hemorrhage or mass effect.  There also appears to be a patchy area of restricted diffusion in the inferior left medial occipital lobe (series 2 image 8) with associated T2 and FLAIR hyperintensity but no mass effect or definite hemorrhage.  This might be more subacute.  Diffusion elsewhere is within normal limits. Major intracranial vascular flow voids are preserved, see MRA findings below.   T2 heterogeneous lesion associated with the interhemispheric fissure at or near the left foramen of Monro has signal loss on T2* imaging and is isointense on T1 (series 6 image 47). This measures 12 x 13 x 8 mm (AP by transverse by CC).  This appears to be just anterior to the  columns of the fornix, rather than posterior as expected for colloid cyst.  There is no associated ventriculomegaly or ventricular signal abnormality.  No second similar lesion is identified.  No other intracranial mass lesion.  Patchy periventricular T2 hyperintensity.  Negative pituitary, cervicomedullary junction, and cerebellum.  Patchy white matter T2 and FLAIR hyperintensity in the pons. Visualized bone marrow signal is within normal limits.  Visualized paranasal sinuses and mastoids are clear.  Visualized orbit soft tissues are within normal limits.  Visualized scalp soft tissues are within normal limits.  IMPRESSION: 1.  Acute infarct (15 mm diameter) centered at the left lateral thalamus and internal capsule. No mass effect or associated hemorrhage. 2.  Subacute appearing left inferior PCA territory infarct in the left occipital lobe.  No mass effect or hemorrhage. 3. Chronic rounded mass at or near the left foramen of Monro with blood products or mineralization is stable since 2009 and measures 12 x 13 x 8 mm. This is not typical of a colloid cyst. I favor a small benign neoplasm (possibly a meningioma given T2* characteristics, central neurocytoma is another consideration). Aneurysm is not favored (see MRA below). Recommend repeat pre and post contrast brain MRI (ideally when the patient can better tolerate with less motion artifact). 4.  MRA findings are below.  MRA HEAD WITHOUT CONTRAST  Technique: Angiographic images of the Circle of Willis were obtained using MRA technique without  intravenous contrast.  Findings: Intermittently degraded by motion.  Antegrade flow in the posterior circulation with codominant distal vertebral arteries.  Basilar artery irregularity without hemodynamically significant stenosis.  PCA origins are patent. There does appear to be asymmetrically decreased distal left PCA flow, but without focal high-grade stenosis.  The right posterior communicating artery is present.   Antegrade flow in both ICA siphons.  Signal loss in the vertical petrous and cavernous segments is favored to reflect atherosclerosis and genu and stenosis rather than an artifact. Subsequently there are bilateral high-grade ICA stenoses, worse on the right (series 7 image 10).  The supraclinoid ICA is and ICA termini remain patent.  MCA and ACA origins are patent.  Anterior communicating artery appears diminutive, normal.  Visualized ACA branches are within normal limits and not in proximity to the left foramen of Monro lesion described above.  The right MCA M1 segment is within normal limits.  There is early right MCA branching.  No major right MCA branch occlusion is evident.  The left MCA M1 segment is irregular with mildly decreased left MCA flow compared to the right.  No major left MCA branch occlusion is identified.  IMPRESSION:  1.  Degraded by motion.  Left MCA irregularity but no major left MCA branch occlusion. 2.  High-grade tandem bilateral ICA siphon stenoses, worse on the right. 3.  ACA branches and anterior communicating artery appear within normal limits and not in proximity to the left foramen of Monro area lesion described on the MRI.  Original Report Authenticated By: Harley Hallmark, M.D.    Medications: Scheduled Meds:   . amLODipine  5 mg Oral Daily  . aspirin EC  325 mg Oral Daily  . clopidogrel  75 mg Oral Daily  . enoxaparin  40 mg Subcutaneous Q24H  . furosemide  40 mg Oral Daily  . insulin aspart  0-20 Units Subcutaneous TID WC  . insulin aspart  0-5 Units Subcutaneous QHS  . insulin aspart  6 Units Subcutaneous TID WC  . insulin glargine  65 Units Subcutaneous QHS  . metoCLOPramide  5 mg Oral QID  . metoprolol tartrate  25 mg Oral BID  . nystatin   Topical BID  . pantoprazole  40 mg Oral Daily  . 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  Plan:  acute CVA, embolic MRI of the brain revealed a acute infarct in the left lateral thalamus and internal capsule. She was seen in consultation by neurology. She was already on aspirin and plavix for her recent cardiac stents. She was continued on the same. She is already on a statin. She will need to implement lifestyle changes and  risk factor modifications. She is being evaluated by inpatient rehab at [facility] and awaiting bed there. 2. Uncontrolled diabetes. Her insulin has been  adjsuted. This appears to be a chronic problem and she may benefit from an outpatient endocrinologist appt  3. HTN  stable  4. Obesity  5. Chronic Combined CHF,  compensated.   Code status  DNR.   Patient clincially stable to be discharged to inpt rehab at [facility]      LOS: 4 days   [name] 10/23/2011, 10:32 AM

## 2011-10-24 DIAGNOSIS — IMO0001 Reserved for inherently not codable concepts without codable children: Secondary | ICD-10-CM

## 2011-10-24 DIAGNOSIS — E1165 Type 2 diabetes mellitus with hyperglycemia: Secondary | ICD-10-CM

## 2011-10-24 DIAGNOSIS — I10 Essential (primary) hypertension: Secondary | ICD-10-CM

## 2011-10-24 DIAGNOSIS — Z5189 Encounter for other specified aftercare: Secondary | ICD-10-CM

## 2011-10-24 DIAGNOSIS — I6330 Cerebral infarction due to thrombosis of unspecified cerebral artery: Secondary | ICD-10-CM

## 2011-10-24 LAB — COMPREHENSIVE METABOLIC PANEL
ALT: 12 U/L (ref 0–35)
Alkaline Phosphatase: 115 U/L (ref 39–117)
CO2: 25 mEq/L (ref 19–32)
GFR calc Af Amer: >90 mL/min (ref >90)
GFR calc non Af Amer: >90 mL/min (ref >90)
Glucose, Bld: 149 mg/dL — ABNORMAL HIGH (ref 70–99)
Potassium: 3.2 mEq/L — ABNORMAL LOW (ref 3.5–5.1)
Sodium: 136 mEq/L (ref 135–145)

## 2011-10-24 LAB — DIFFERENTIAL
Basophils Absolute: 0 10*3/uL (ref 0.0–0.1)
Eosinophils Absolute: 0.3 10*3/uL (ref 0.0–0.7)
Eosinophils Relative: 3 % (ref 0–5)

## 2011-10-24 LAB — GLUCOSE, CAPILLARY: Glucose-Capillary: 247 mg/dL — ABNORMAL HIGH (ref 70–99)

## 2011-10-24 LAB — CBC
Hemoglobin: 12.2 g/dL (ref 12.0–15.0)
MCH: 27.8 pg (ref 26.0–34.0)
RBC: 4.39 MIL/uL (ref 3.87–5.11)

## 2011-10-24 MED ORDER — POTASSIUM CHLORIDE CRYS ER 20 MEQ PO TBCR
20.0000 meq | EXTENDED_RELEASE_TABLET | Freq: Every day | ORAL | Status: DC
  Administered 2011-10-24 – 2011-10-31 (×8): 20 meq via ORAL
  Filled 2011-10-24 (×10): qty 1

## 2011-10-24 NOTE — Progress Notes (Signed)
Patient information reviewed and entered into [employer] system by Solyana Nonaka, RN, CRRN, PPS Coordinator.  Information including medical coding and functional independence measure will be reviewed and updated through discharge.     Per nursing patient was given "Data Collection Information Summary for Patients in Inpatient Rehabilitation Facilities with attached "Privacy Act Statement-Health Care Records" upon admission.   

## 2011-10-24 NOTE — Progress Notes (Signed)
Physical Therapy Assessment and Plan  Patient Details  Name: [name] MRN: [id number] Date of Birth: [birth year]  PT Diagnosis: Abnormality of gait, Cognitive deficits, Hemiparesis dominant, Impaired sensation and Muscle weakness Rehab Potential:  good ELOS: 10-14 days   Today's Date: 10/24/2011 Time: [phone]  60 minutes   Assessment & Plan Clinical Impression: Patient is a 60 y.o. year old female with recent admission to the hospital on December 8 with right-sided weakness and numbness as well as slurred speech. 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 It was also a subacute-appearing left inferior PCA territory infarct in the left occipital lobe   Patient transferred to CIR on 10/23/2011 .  Patient's past medical history is significant for CHF, DM, COPD.    Patient currently requires max with mobility secondary to decreased cardiorespiratoy endurance, unbalanced muscle activation and decreased coordination, decreased safety awareness , impaired gait,and decreased balance.  Prior to hospitalization, patient was independent with assistive devices for mobility and lived with Daughter in a Mobile home home.  Home access is 4-5Stairs to enter.  Patient will benefit from skilled PT intervention to maximize safe functional mobility, minimize fall risk and decrease caregiver burden for planned discharge home with intermittent assist.  Anticipate patient will benefit from follow up OP at discharge.  PT - End of Session Activity Tolerance: Tolerates 30+ min activity with multiple rests Endurance Deficit: Yes Endurance Deficit Description: requires multiple rests, deconditioning PT Assessment Barriers to Discharge: None PT Plan PT Frequency: 1-2 X/day, 60-90 minutes Estimated Length of Stay: 10-14 days PT Treatment/Interventions: Ambulation/gait training;Balance/vestibular training;Cognitive remediation/compensation;DME/adaptive equipment  instruction;Community reintegration;Functional mobility training;Neuromuscular re-education;Pain management;Patient/family education;Therapeutic Exercise;Therapeutic Activities;Stair training;UE/LE Strength taining/ROM;UE/LE Coordination activities;Wheelchair propulsion/positioning PT Recommendation Follow Up Recommendations: Outpatient PT Equipment Recommended:  (pt owns RW, w/c)  Precautions/Restrictions Precautions Precautions: Fall Required Braces or Orthoses: No Restrictions Weight Bearing Restrictions: No  Vital Signs O2 98% on room air with activity Pain Pain Assessment Pain Assessment: No/denies pain Home Living/Prior Functioning Home Living Lives With: Daughter Receives Help From: Family Type of Home: Mobile home Home Layout: One level Home Access: Stairs to enter Entrance Stairs-Rails: Left Entrance Stairs-Number of Steps: 4-5 Home Adaptive Equipment: Walker - rolling;Wheelchair - manual Prior Function Level of Independence: Independent with basic ADLs;Independent with homemaking with ambulation;Independent with transfers;Independent with gait (used RW out of house) Able to Take Stairs?: Yes Driving: Yes  Cognition Overall Cognitive Status: Impaired Orientation Level: Oriented X4 Attention: Selective Selective Attention: Appears intact Memory: Appears intact Safety/Judgment: Impaired Comments: impulsive when fatigued Sensation Sensation Light Touch: Impaired by gross assessment Light Touch Impaired Details: Impaired LLE;Impaired RLE (bilateral feet numb) Stereognosis: Not tested Hot/Cold: Not tested Proprioception: Not tested Coordination Gross Motor Movements are Fluid and Coordinated: No Coordination and Movement Description: decreased coordination R>L Motor  Motor Motor: Abnormal postural alignment and control Motor - Skilled Clinical Observations: decreased wt bearing on R  Mobility Bed Mobility Bed Mobility: No Transfers Stand Pivot Transfers:  3: Mod assist Stand Pivot Transfer Details: Verbal cues for sequencing;Verbal cues for technique;Manual facilitation for weight shifting;Manual facilitation for weight bearing;Manual facilitation for placement;Verbal cues for precautions/safety Locomotion  Ambulation Ambulation: Yes Ambulation/Gait Assistance: 1: +2 Total assist Ambulation Distance (Feet): 30 Feet Assistive device: 2 person hand held assist Ambulation/Gait Assistance Details: Manual facilitation for weight shifting;Manual facilitation for weight bearing;Tactile cues for weight shifting;Verbal cues for technique;Verbal cues for sequencing;Verbal cues for gait pattern Gait Gait: Yes Gait Pattern: Impaired Gait Pattern: Shuffle;Trunk flexed;Decreased stride length;Decreased weight shift to  right (decreased foot clearance on R) Stairs / Additional Locomotion Stairs: Yes Stairs Assistance: 3: Mod assist Stairs Assistance Details: Manual facilitation for weight shifting;Manual facilitation for weight bearing;Verbal cues for technique;Verbal cues for sequencing;Verbal cues for gait pattern Stair Management Technique: One rail Left;Sideways;Step to pattern Number of Stairs: 3  Height of Stairs: 4  Wheelchair Mobility Wheelchair Mobility: Yes Wheelchair Assistance: 4: Administrator, sports Details: Verbal cues for technique;Verbal cues for sequencing;Verbal cues for Engineer, drilling: Both upper extremities (needed cues to use R UE) Wheelchair Parts Management: Needs assistance Distance: 30  Trunk/Postural Assessment  Cervical Assessment Cervical Assessment: Within Functional Limits Thoracic Assessment Thoracic Assessment:  (forward flexed posture) Lumbar Assessment Lumbar Assessment: Within Functional Limits Postural Control Postural Control:  (decreased wt bearing on R)  Balance Balance Balance Assessed: Yes Static Sitting Balance Static Sitting - Level of Assistance: 5: Stand by  assistance Dynamic Sitting Balance Dynamic Sitting - Balance Support: Bilateral upper extremity supported;Feet supported Dynamic Sitting - Level of Assistance: 5: Stand by assistance Static Standing Balance Static Standing - Balance Support: No upper extremity supported Static Standing - Level of Assistance: 4: Min assist Static Standing - Comment/# of Minutes: able to maintain 45 seconds before fatigue Dynamic Standing Balance Dynamic Standing - Level of Assistance: 2: Max assist (with reaching out of BOS) Extremity Assessment      RLE Assessment RLE Assessment: Exceptions to [facility] RLE AROM (degrees) Overall AROM Right Lower Extremity:  (hip ROM limited by strength) RLE Strength RLE Overall Strength Comments: hip flex 3-/5, knee ext 3+/5, ankle DF 3+/5, limitation in functional actitivities LLE Assessment LLE Assessment: Within Functional Limits (grossly 4/5)  Recommendations for other services: None  Discharge Criteria: Patient will be discharged from PT if patient refuses treatment 3 consecutive times without medical reason, if treatment goals not met, if there is a change in medical status, if patient makes no progress towards goals or if patient is discharged from hospital.  The above assessment, treatment plan, treatment alternatives and goals were discussed and mutually agreed upon: by patient  PT treatment PT treatment initiated following eval:  Gait training with RW with min A 30' continues with decreased wt shift and decreased R foot clearance.  Standing balance without UE support reaching out of BOS with mod-max assist to correct LOB.  Wt shifts in squat position to increase wt shifting to R and increase proximal hip musculature strength.  DONAWERTH,KAREN 10/24/2011, 4:05 PM

## 2011-10-24 NOTE — Progress Notes (Signed)
Physical Therapy Note  Patient Details  Name: [name] MRN: [id number] Date of Birth: Jul 29, 1951 Today's Date: 10/24/2011  Time: 1447-1515 28 minutes  No c/o pain.  NMR standing with wt shifts, wt shifts in squat position with manual facilitation for glut contraction, proximal hip musculature contraction, full wt shifts to both sides.  Pt demo's hesitancy to shift both directions due to fear of falling.  Supine NMR with focus on abdominal, core and proximal hip musculature with bridging, bridging with hip adduction, LTR with hip adduction.  Pt displays significant weakness in hip adductors on R.  Individual Therapy   Zylan Almquist 10/24/2011, 3:28 PM

## 2011-10-24 NOTE — Progress Notes (Signed)
Speech Language Pathology Assessment and Plan  Patient Details  Name: [name] MRN: [id number] Date of Birth: [birth year]  SLP Diagnosis: Mild Dysarthria, will continue to assess higher-level cognitive functions Rehab Potential: Good ELOS: 10 days  Time: 1030-1130 Time Calculation (min): 60 min  Session 1: Administered cognitive-linguistic evaluation. Pt also observed with regular textures and thin liquids via straw without overt s/s of aspiration.   Short-Term Goals: 1. Demonstrate higher-level problem solving with complex tasks with Mod I. 2. Utilize speech intelligibility strategies to increase intelligibility to 100% at the conversation level with Mod I.  Assessment & Plan  Clinical Impression: 60 year old female with history of coronary artery disease congestive heart failure as well as uncontrolled diabetes mellitus. Admitted [facility] hospital on December 8 with right-sided weakness and numbness as well as slurred speech. 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 It was also a subacute-appearing left inferior PCA territory infarct in the left occipital lobe. Patient transferred to CIR on 10/23/2011 and presents with mild dysarthria impacting speech intelligibly. Basic cognitive function intact, will continue to assess for any higher-level cognitive impairment.  SLP - End of Session Patient left: in chair;with call bell in reach Assessment Rehab Potential: Good Barriers to Discharge: None Therapy Diagnosis: Dysarthria;Cognitive Impairments;Other (comment) (basic cognitive function intact, higher-level to be assessed)  Precautions/Restrictions  Precautions Precautions: Fall Required Braces or Orthoses: No Restrictions Weight Bearing Restrictions: No  Vital Signs Oxygen Therapy SpO2: 94 % (remained above 90 with ADL tasks) O2 Device: None (Room air) Pain Pain Assessment Pain Assessment: No/denies pain Prior Functioning Type of Home:  Mobile home Lives With: Daughter Receives Help From: Family Cognition Overall Cognitive Status: Appears within functional limits for tasks assessed Arousal/Alertness: Awake/alert Orientation Level: Oriented X4 Attention: Selective Selective Attention: Appears intact Awareness: Appears intact Problem Solving: Appears intact (called nurse to ask question about medications) Safety/Judgment: Appears intact (verbalized unable to get out of chair without assistance) Comments: Will continue cognitive assessment to address higher-level cognitive functions Comprehension Auditory Comprehension Yes/No Questions: Within Functional Limits Commands: Impaired Multistep Basic Commands: 50-74% accurate Conversation: Simple Visual Recognition/Discrimination Discrimination: Within Function Limits Reading Comprehension Reading Status: Within funtional limits Expression Expression Primary Mode of Expression: Verbal Verbal Expression Initiation: No impairment Level of Generative/Spontaneous Verbalization: Conversation Repetition: No impairment Naming: No impairment Pragmatics: No impairment Interfering Components: Speech intelligibility;Other (comment) (mild dysarthria) Other Verbal Expression Comments: Pt with minimal word-finding deficits. Pt independently utilized comepnsatory strategy of description to increase recall.  Written Expression Dominant Hand: Right Written Expression: Unable to assess (comment) (legibility impacted by weakness in RUE) Oral/Motor Motor Speech Intelligibility: Intelligibility reduced   Recommendations for other services: None  Discharge Criteria: Patient will be discharged from SLP if patient refuses treatment 3 consecutive times without medical reason, if treatment goals not met, if there is a change in medical status, if patient makes no progress towards goals or if patient is discharged from hospital.  The above assessment, treatment plan, treatment  alternatives and goals were discussed and mutually agreed upon: by patient  [name] 10/24/2011 12:01 PM

## 2011-10-24 NOTE — Progress Notes (Signed)
Patient ID: [name], female   DOB: 18-Oct-1951, 60 y.o.   MRN: [id number] Subjective/Complaints: Review of Systems  Constitutional: Negative.   HENT: Negative.   Eyes: Negative.   Respiratory: Negative.   Cardiovascular: Negative.   Gastrointestinal: Negative.   Genitourinary: Positive for urgency and frequency.  Musculoskeletal: Negative.   Skin: Negative.    Had a really good night  Objective: Vital Signs: Blood pressure 135/80, pulse 93, temperature 98.3 F (36.8 C), temperature source Oral, resp. rate 19, height 5\' 3"  (1.6 m), weight 100.1 kg (220 lb 10.9 oz), SpO2 97.00%. No results found.  Basename 10/24/11 0540  WBC 9.0  HGB 12.2  HCT 37.9  PLT 274   No results found for this basename: NA:2,K:2,CL:2,CO2:2,GLUCOSE:2,BUN:2,CREATININE:2,CALCIUM:2 in the last 72 hours CBG (last 3)   Basename 10/24/11 0714 10/23/11 2227 10/23/11 1549  GLUCAP 130* 196* 200*    Wt Readings from Last 3 Encounters:  10/23/11 100.1 kg (220 lb 10.9 oz)  10/22/11 98.4 kg (216 lb 14.9 oz)  09/02/11 98.34 kg (216 lb 12.8 oz)    Physical Exam:  General appearance: alert, cooperative and no distress Head: Normocephalic, without obvious abnormality, atraumatic Eyes: conjunctivae/corneas clear. PERRL, EOM's intact. Fundi benign. Ears: normal TM's and external ear canals both ears Nose: Nares normal. Septum midline. Mucosa normal. No drainage or sinus tenderness. Throat: lips, mucosa, and tongue normal; teeth and gums normal Neck: no adenopathy, no carotid bruit, no JVD, supple, symmetrical, trachea midline and thyroid not enlarged, symmetric, no tenderness/mass/nodules Back: symmetric, no curvature. ROM normal. No CVA tenderness. Resp: clear to auscultation bilaterally Cardio: regular rate and rhythm, S1, S2 normal, no murmur, click, rub or gallop GI: soft, non-tender; bowel sounds normal; no masses,  no organomegaly Extremities: extremities normal, atraumatic, no cyanosis or  edema Pulses: 2+ and symmetric Skin: Skin color, texture, turgor normal. No rashes or lesions Neurologic: She is alert and oriented to person, place, and time.  Mild dysarthria. Decreased visual acuity but no focal visual deficits. No nystagmus is seen. She has diminished fine motor coordination of right arm and right leg. Positive pronator drift is seen on the right. Sensory exam is 1+ out of 2 right arm and right leg. Strength right upper extremity is grossly 4/5 proximal distal. Right lower extremity strength is 3/5 proximal to 4/5 distally. Left upper lower extremity strength are 5 out of 5 grossly. Cognitively patient is alert and appropriate with good insight and awareness.   Incision/Wound: skin clear   Assessment/Plan: 1. 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 Physiatrist is providing close team supervision and 24 hour management of active medical problems listed below. Physiatrist and rehab team continue to assess barriers to discharge/monitor patient progress toward functional and medical goals. Mobility:         ADL:   Cognition: Cognition Orientation Level: Oriented to person;Oriented to place;Oriented to situation Cognition Orientation Level: Oriented to person;Oriented to place;Oriented to situation   2. DVT Prophylaxis/Anticoagulation: Subcutaneous Lovenox as well as support hose. Monitor for any signs of deep vein thrombosis. Monitor platelet counts closely and any bleeding episodes.   3. Uncontrolled diabetes mellitus-hemoglobin A1c of 12.3. Continue Lantus insulin and NovoLog as directed. Check blood sugars  a.c. and at bedtime and adjust insulin as needed. Provide full diabetic education. It sounds as if sugars were poorly controlled before. Some improvement in  CBG's  yesterday.  4. Hypertension-Norvasc 5 mg daily/Lopressor 25 mg twice daily/Lasix 40 mg daily. Monitor closely with increased activity. Secondary education regarding blood pressure control.   5. Coronary artery disease with PTCA-continue aspirin therapy as well as Plavix. Patient no current complaints of chest pain and monitor  therapy progresses.   6. COPD/OSA. Continue Combivent inhaler. Check oxygen saturations every shift and applied 2 L oxygen saturations less than 90%.   7. Hyperlipidemia-Crestor   8. Bladder: Patient with baseline incontinence. Place patient on timed voiding schedule. Check PVRs. Catheterize as needed. White the anatomic and neurological components to her incontinence. Urinalysis equivocal. Urine culture      LOS (Days) 1 A FACE TO FACE EVALUATION WAS PERFORMED  Kailynne Ferrington T 10/24/2011, 7:56 AM

## 2011-10-24 NOTE — Progress Notes (Signed)
Occupational Therapy Assessment and Plan  Patient Details  Name: [name] MRN: [id number] Date of Birth: 1951/10/09  OT Diagnosis: disturbance of vision and hemiplegia affecting dominant side Rehab Potential: Rehab Potential: Good ELOS: 10 -12days   Today's Date: 10/24/2011 Time: 730-830( )   Assessment & Plan Clinical Impression: Patient is a 60 y.o. year old right-handed female with history of coronary artery disease congestive heart failure as well as uncontrolled diabetes mellitus. Admitted [facility] hospital on December 8 with right-sided weakness and numbness as well as slurred speech. 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 It was also a subacute-appearing left inferior PCA territory infarct in the left occipital lobe. MRA of the head with no major occlusion. Carotid Doppler showed less than 50% stenosis bilateral internal carotid arteries. Echocardiogram ejection fraction 55% grade 2 diastolic dysfunction. Neurology followup maintained on aspirin and Plavix therapy as prior to hospital admission for history of cardiac stents. Subcutaneous Lovenox was added for deep vein thrombosis prophylaxis. Noted hemoglobin A1c of 12.3 and her Lantus insulin was adjusted.   Patient transferred to CIR on 10/23/2011 .  Patient's past medical history is significant for:  GERD (gastroesophageal reflux disease)    .  PAD (peripheral artery disease)      Stent, embolectomy, surgery / Dr. Darrick Penna, ABI 0.52 right, 0.54 left, February, 2010   .  COPD (chronic obstructive pulmonary disease)      Dr. Craige Cotta, medications, PFTs August, 2012   .  OSA (obstructive sleep apnea)      Dr. Craige Cotta CPAP started August, 2012   .  Systolic and diastolic CHF, chronic    .  CAD (coronary artery disease)      June, 2012, DES to RCA, staged DES to circumflex and DES to diagonal with pressure wire to LAD, FFR 0.84   .  DM (diabetes mellitus), type 2    .  Hypertension    .  Morbid  obesity    .  Hyperlipidemia    .  Ejection fraction < 50%      EF 45-50%, echo, June, 2012   .  Diastolic dysfunction      grade 2, echo, June, 2012   .  Peripheral neuropathy    .  Tobacco abuse    .  Syncope      vasovagal   .  Breast cancer      lumpectomy and radiation 2002   .  MI, old     Past Surgical History   Procedure  Date   .  Appendectomy    .  Cholecystectomy    .  Tubal ligation    .  Breast surgery      Left breast cancer   .  Cardiac catheterization  2002   .  Popliteal artery angioplasty  11/30/10     Left leg w/ embolectomy, endarterectomy by Dr.Charles Fields   .  Coronary angioplasty with stent placement       Patient currently requires mod with basic self-care skills secondary to muscle weakness, decreased oxygen support, impaired timing and sequencing, abnormal tone, motor apraxia, decreased coordination and decreased motor planning, decreased visual acuity, decreased attention to right and decreased motor planning, delayed processing and decreased standing balance, hemiplegia and decreased balance strategies and decreased activity tolerance- requiring rest breaks.  Prior to hospitalization, patient could complete ADLs with independence.  Patient will benefit from skilled intervention to increase independence with basic self-care skills and decrease fall risk  prior to discharge home with care partner.  Anticipate patient will require 24 hour supervision and follow up outpatient.  OT - End of Session Activity Tolerance: Tolerates 30+ min activity with multiple rests Endurance Deficit: Yes OT Assessment Rehab Potential: Good OT Plan OT Frequency: 1-2 X/day, 60-90 minutes Estimated Length of Stay: 10 -12days OT Treatment/Interventions: Ambulation/gait training;Community reintegration;Functional mobility training;Self Care/advanced ADL retraining;Therapeutic Exercise;Visual/perceptual remediation/compensation;UE/LE Strength taining/ROM;Neuromuscular  re-education;DME/adaptive equipment instruction;Balance/vestibular training;Cognitive remediation/compensation;Patient/family education;Therapeutic Activities;UE/LE Coordination activities OT Recommendation Follow Up Recommendations: Outpatient OT Equipment Recommended: Tub/shower bench  Precautions/Restrictions  Precautions Precautions: Fall Required Braces or Orthoses: No Restrictions Weight Bearing Restrictions: No General Chart Reviewed: Yes Family/Caregiver Present: No Vital Signs Therapy Vitals Temp: 98.3 F (36.8 C) Temp src: Oral Pulse Rate: 93  Resp: 19  BP: 135/80 mmHg Patient Position, if appropriate: Sitting Oxygen Therapy SpO2: 94 % (remained above 90 with ADL tasks) O2 Device: None (Room air) Pain Pain Assessment Pain Assessment: No/denies pain Home Living/Prior Functioning Home Living Lives With: Daughter Receives Help From: Family Type of Home: Mobile home Home Layout: One level Home Access: Stairs to enter Entrance Stairs-Rails: Left Entrance Stairs-Number of Steps: 5 steps to get in Bathroom Shower/Tub: Tub/shower unit [employer]: Standard Home Adaptive Equipment: Walker - rolling IADL History Current License: Yes Mode of Transportation: Car Occupation: On disability ADL ADL Eating: Set up;Minimal cueing Where Assessed-Eating: Chair Grooming: Minimal assistance Where Assessed-Grooming: Chair Upper Body Bathing: Setup;Moderate cueing Where Assessed-Upper Body Bathing: Shower Lower Body Bathing: Moderate assistance;Minimal cueing Where Assessed-Lower Body Bathing: Shower Upper Body Dressing: Minimal cueing;Minimal assistance Where Assessed-Upper Body Dressing: Chair Lower Body Dressing: Minimal cueing;Maximal assistance Where Assessed-Lower Body Dressing: Standing at sink;Sitting at sink [employer]: Moderate assistance Film/video editor Method: Warden/ranger: Photographer Vision/Perception  Vision - History Baseline Vision: Bifocals Patient Visual Report:  (reports central vision is blurry, decr visual acuity) Vision - Assessment Eye Alignment: Within Functional Limits Perception Perception: Within Functional Limits Praxis Praxis: Impaired Praxis Impairment Details: Motor planning  Cognition Arousal/Alertness: Awake/alert Orientation Level: Oriented X4 Attention: Selective Selective Attention: Appears intact Awareness: Appears intact Problem Solving: Appears intact Safety/Judgment: Appears intact Sensation Sensation Light Touch: Impaired Detail Light Touch Impaired Details: Impaired RUE;Impaired RLE Stereognosis: Impaired by gross assessment Hot/Cold: Impaired Detail Hot/Cold Impaired Details: Impaired RUE Proprioception: Impaired Detail Proprioception Impaired Details: Impaired RUE Coordination Gross Motor Movements are Fluid and Coordinated: No Fine Motor Movements are Fluid and Coordinated: No Coordination and Movement Description: decreased coordination in right UE and LE with functional task Motor  Motor Motor: Hemiplegia;Motor apraxia Mobility  Transfers Transfers: Yes Sit to Stand: 4: Min assist Stand to Sit: 4: Min assist  Trunk/Postural Assessment  Cervical Assessment Cervical Assessment: Within Functional Limits Thoracic Assessment Thoracic Assessment: Within Functional Limits (flexed posture) Lumbar Assessment Lumbar Assessment: Within Functional Limits Postural Control Postural Control: Within Functional Limits  Balance Balance Balance Assessed: Yes Dynamic Sitting Balance Dynamic Sitting - Balance Support: Feet supported;During functional activity Dynamic Sitting - Level of Assistance: 4: Min assist Static Standing Balance Static Standing - Balance Support: During functional activity Static Standing - Level of Assistance: 4: Min assist;3: Mod assist Extremity/Trunk Assessment RUE Assessment RUE  Assessment: Exceptions to WFL (AROM 0 -90, decr FMC, pronator dift ) RUE Strength RUE Overall Strength: Deficits (3/5) RUE Tone RUE Tone Comments: Brunstrom V LUE Assessment LUE Assessment: Within Functional Limits  Recommendations for other services: None  Discharge Criteria: Patient will be discharged from OT if patient  refuses treatment 3 consecutive times without medical reason, if treatment goals not met, if there is a change in medical status, if patient makes no progress towards goals or if patient is discharged from hospital.  The above assessment, treatment plan, treatment alternatives and goals were discussed and mutually agreed upon: by patient   Treatment session: 1:1 OT eval initiated; OT purpose, role and goals discussed. Self care retraining at shower level with focus on transfers- stand pivot, standing balance, attention to right hand, body awareness, use of right hand in functional task, gross and fine motor coordination.  Pt demonstrated decr memory ?- different responses about home environment than what another therapist had gathered.  Melonie Florida 10/24/2011, 8:40 AM

## 2011-10-24 NOTE — Progress Notes (Signed)
Recreational Therapy Assessment and Plan  Patient Details  Name: [name] MRN: [id number] Date of Birth: 09-20-1951  Rehab Potential: Good ELOS: 10 days   Assessment Patient is a 60 y.o. year old female with recent admission to the hospital on December 8 with right-sided weakness and numbness as well as slurred speech. 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 It was also a subacute-appearing left inferior PCA territory infarct in the left occipital lobe  Patient transferred to CIR on 10/23/2011 . Patient's past medical history is significant for CHF, DM, COPD.  Pt presents with decreased activity tolerance, decreased functional mobility, decreased balance, decreased coordination, decreased safety awareness limiting p'ts independence with leisure/community pursuits. Clinical Impression:   Recreational Therapy Leisure History/Participation Premorbid leisure interest/current participation: Ashby Dawes - Flower gardening;Community - Grocery store;Community - Other (Comment);Games - Other (Comment) (go to [employer], houseplants) Premorbid leisure interest/past participation (>2 yrs ago): Ashby Dawes - Flower gardening;Nature - Vegetable gardening Expression Interests: Music (Comment);Dance (country) Other Leisure Interests: Cooking/Baking;Housework;Computer;Television (computer games) Leisure Participation Style: With Family/Friends Awareness of Community Resources: Good-identify 3 post discharge leisure resources [employer] Appropriate for Education?: Yes Patient Agreeable to Outing?: Yes Stress Management: Good Methods of Stress Management: deal with it head on... Patient agreeable to Pet Therapy: Yes Does patient have pets?: No Social interaction - Mood/Behavior: Cooperative Recreational Therapy Orientation Orientation -Reviewed with patient: Available activity resources Strengths/Weaknesses Patient Strengths/Abilities: Willingness to  participate;Active premorbidly Patient weaknesses: Physical limitations  Plan Rec Therapy Plan Is patient appropriate for Therapeutic Recreation?: Yes Treatment times per week: min 1 time per week > 20 minutes Estimated Length of Stay: 10 days Therapy Goals Achieved By:: Recreation/leisure participation;Community reintegration/education;1:1 session;Adaptive equipment instruction;Patient/family education;Provide activity resources in room  Recommendations for other services: None  Discharge Criteria: Patient will be discharged from TR if patient refuses treatment 3 consecutive times without medical reason.  If treatment goals not met, if there is a change in medical status, if patient makes no progress towards goals or if patient is discharged from hospital.  The above assessment, treatment plan, treatment alternatives and goals were discussed and mutually agreed upon: by patient  Hazley Dezeeuw 10/24/2011, 4:54 PM

## 2011-10-25 DIAGNOSIS — I10 Essential (primary) hypertension: Secondary | ICD-10-CM

## 2011-10-25 DIAGNOSIS — I6330 Cerebral infarction due to thrombosis of unspecified cerebral artery: Secondary | ICD-10-CM

## 2011-10-25 DIAGNOSIS — Z5189 Encounter for other specified aftercare: Secondary | ICD-10-CM

## 2011-10-25 DIAGNOSIS — E1165 Type 2 diabetes mellitus with hyperglycemia: Secondary | ICD-10-CM

## 2011-10-25 LAB — GLUCOSE, CAPILLARY: Glucose-Capillary: 135 mg/dL — ABNORMAL HIGH (ref 70–99)

## 2011-10-25 MED ORDER — INSULIN GLARGINE 100 UNIT/ML ~~LOC~~ SOLN
70.0000 [IU] | Freq: Every day | SUBCUTANEOUS | Status: DC
  Administered 2011-10-25 – 2011-10-26 (×2): 70 [IU] via SUBCUTANEOUS
  Filled 2011-10-25: qty 3

## 2011-10-25 MED ORDER — INSULIN ASPART 100 UNIT/ML ~~LOC~~ SOLN
8.0000 [IU] | Freq: Three times a day (TID) | SUBCUTANEOUS | Status: DC
  Administered 2011-10-25 – 2011-10-28 (×12): 8 [IU] via SUBCUTANEOUS

## 2011-10-25 NOTE — Progress Notes (Addendum)
Inpatient Rehabilitation Center Individual Statement of Services  Patient Name:  [name]  Date:  10/25/2011  Welcome to the Inpatient Rehabilitation Center.  Our goal is to provide you with an individualized program based on your diagnosis and situation, designed to meet your specific needs.  With this comprehensive rehabilitation program, you will be expected to participate in at least 3 hours of rehabilitation therapies Monday-Friday, with modified therapy programming on the weekends.  Your rehabilitation program will include the following services:  Physical Therapy (PT), Occupational Therapy (OT), Speech Therapy (ST), 24 hour per day rehabilitation nursing, Therapeutic Recreaction (TR), Case Management (RN and Child psychotherapist), Rehabilitation Medicine, Nutrition Services and Pharmacy Services  Weekly team conferences will be held on Tuesdays to discuss your progress.  Your RN Case Designer, television/film set will talk with you frequently to get your input and to update you on team discussions.  Team conferences with you and your family in attendance may also be held.  Estimated length of stay:10 14 days  Overall predicted outcome: Supervision - Modified Independent  Depending on your progress and recovery, your program may change.  Your RN Case Estate agent will coordinate services and will keep you informed of any changes.  Your RN Sports coach and SW names and contact numbers are listed  below.  The following services may also be recommended but are not provided by the Inpatient Rehabilitation Center:   Driving Evaluations  Home Health Rehabiltiation Services  Outpatient Rehabilitatation [facility]  Vocational Rehabilitation   Arrangements will be made to provide these services after discharge if needed.  Arrangements include referral to agencies that provide these services.  Your insurance has been verified to be:  Medicare Your primary doctor is:  Dr. Flint Melter  Pertinent information will be shared with your doctor and your insurance company.  Case Manager:Anne Varney Daily [phone]  Social Worker: Amada [place], [place] [phone]  Information discussed with and copy given to patient by: Meryl Dare, 10/25/2011

## 2011-10-25 NOTE — Progress Notes (Signed)
Social Work Assessment and Plan Assessment and Plan  Patient Name: [name]  [id number] Date: 10/25/2011  Problem List:  Patient Active Problem List  Diagnoses  . OSA (obstructive sleep apnea)  . PAD (peripheral artery disease)  . COPD (chronic obstructive pulmonary disease)  . Systolic and diastolic CHF, chronic  . CAD (coronary artery disease)  . Hypertension  . Hyperlipidemia  . Ejection fraction < 50%  . Diastolic dysfunction  . Syncope  . CVA (cerebral vascular accident)  . Hemiparesis, acute  . DM type 2 (diabetes mellitus, type 2)  . Hypokalemia  . CVA (cerebral infarction)    Past Medical History:  Past Medical History  Diagnosis Date  . GERD (gastroesophageal reflux disease)   . PAD (peripheral artery disease)     Stent, embolectomy, surgery  /     Dr. Darrick Penna, ABI 0.52 right, 0.54 left, February, 2010  . COPD (chronic obstructive pulmonary disease)     Dr. Craige Cotta, medications, PFTs August, 2012  . OSA (obstructive sleep apnea)     Dr. Craige Cotta  CPAP started August, 2012  . Systolic and diastolic CHF, chronic   . CAD (coronary artery disease)     June, 2012, DES to RCA, staged DES to circumflex and DES to diagonal with pressure wire to LAD, FFR 0.84  . DM (diabetes mellitus), type 2   . Hypertension   . Morbid obesity   . Hyperlipidemia   . Ejection fraction < 50%     EF 45-50%, echo, June, 2012  . Diastolic dysfunction     grade 2, echo, June, 2012  . Peripheral neuropathy   . Tobacco abuse   . Syncope     vasovagal  . Breast cancer     lumpectomy and radiation 2002  . MI, old     Past Surgical History:  Past Surgical History  Procedure Date  . Appendectomy   . Cholecystectomy   . Tubal ligation   . Breast surgery     Left breast cancer  . Cardiac catheterization 2002  . Popliteal artery angioplasty 11/30/10    Left leg w/ embolectomy, endarterectomy  by Dr.Charles Fields  . Coronary angioplasty with stent placement     Discharge Planning    Discharge Planning Do you have any problems obtaining your medications?:  (does note some difficulty affording insulin - will f/u on th) Type of Residence:  (mobile home she rents)  Social/Family/Support Systems Social/Family/Support Systems Patient Roles: Parent Anticipated Industrial/product designer Information: see above  Employment Status Employment Status Employment Status: Disabled Date Retired/Disabled/Unemployed: approx. 10 years Legal Hisotry/Current Legal Issues: none  Abuse/Neglect Abuse/Neglect Assessment (Assessment to be complete while patient is alone) Physical Abuse: Denies Verbal Abuse: Denies Sexual Abuse: Denies Exploitation of patient/patient's resources: Denies Self-Neglect: Denies  Emotional Status Emotional Status Pt's affect, behavior adn adjustment status: pleasant, talkative woman who denies any significant emotional distress.  No s/s of depression or anxiety.  Making jokes during interview and laughing easily.  Denies any concerns about family support and managing at home after d/c Recent Psychosocial Issues: None Pyschiatric History: None  Patient/Family Perceptions, Expectations & Goals Pt/Family Perceptions, Expectations and Goals Pt/Family understanding of illness & functional limitations: Pt. with very basic understanding of her stroke, but not sure of cause.  Is able to note that her multiple chronic health problems ("high blood pressure, diabetes and cholesterol") may have contributed but I do feel that general stroke prevention ed would be beneficial. Premorbid pt/family roles/activities: Mother, Grandmother and  soon-to-be great grandmother;  independent Anticipated changes in roles/activities/participation: daughter and other family may need to assume some caregiver roles/ duties Pt/family expectations/goals: "I'd like for my leg and arm to get stronger"  [employer]: None Premorbid Home Care/DME  Agencies: None Transportation available at discharge: yes Resource referrals recommended: Support group (specify) (Stroke Group)  Discharge Visual merchandiser Resources: [employer] Financial Resources: [employer] Financial Screen Referred: No Living Expenses: Rent Money Management: Patient;Family Home Management: pt and family share Patient/Family Preliminary Plans: Home with daughter (grandson and his wife also in home)  Clinical Impression:  Pleasant woman here after a stroke.  Has general awareness of limitations and medical issues.  Denies any significant emotional distress but will monitor.  Good family support.  Megan Salon, [name] 10/25/2011

## 2011-10-25 NOTE — Progress Notes (Signed)
Physical Therapy Session Note  Patient Details  Name: [name] MRN: [id number] Date of Birth: [birth year]  Today's Date: 10/25/2011 Time: 0900-1000 Time Calculation (min): 60 min  Precautions: Precautions Precautions: Fall Required Braces or Orthoses: No Restrictions Weight Bearing Restrictions: No  Short Term Goals: PT Short Term Goal 1: Pt will perform functional transfers with min A PT Short Term Goal 2: pt will gait 100' in controlled environment with min A PT Short Term Goal 3: Pt will demonstrate dynamic balance during functional activity with min A  General Chart Reviewed: Yes Vital Signs SpO2 96-99% on room air and HR <110bpm throughout entire session. Pain Pain Assessment Pain Assessment: No/denies pain Mobility Bed Mobility Bed Mobility: No Transfers Transfers: Yes Sit to Stand: 4: Min assist Sit to Stand Details: Manual facilitation for weight shifting;Verbal cues for precautions/safety;Verbal cues for technique Stand to Sit: 4: Min assist Stand to Sit Details (indicate cue type and reason): Verbal cues for precautions/safety;Verbal cues for technique;Manual facilitation for weight shifting Locomotion  Ambulation Ambulation: Yes Ambulation/Gait Assistance: 4: Min assist;Other (comment) (with RW) Ambulation Distance (Feet): 80 Feet Assistive device: Rolling walker Ambulation/Gait Assistance Details: Verbal cues for precautions/safety;Verbal cues for safe use of DME/AE Wheelchair Mobility Wheelchair Mobility: Yes Wheelchair Assistance: 4: Min Armed forces logistics/support/administrative officer Assistance Details: Verbal cues for sequencing;Verbal cues for technique Wheelchair Propulsion: Both upper extremities Wheelchair Parts Management: Needs assistance Distance: 40    Other Treatments   Gait ~69ft with RW min A focusing on gluteal activation and tall posture.  Exercises for increased weight-bearing and activation of RLE:  5x sit<->stand with vc's for hand placement and wt  shifts  In steady, 8x isometric squat holds.  Verbal cues to bend knees and push bottom back and hold.  Pt's preferred method of compensating for verbal cue "push your bottom back" is to lock out legs and bend over at waist.  Without steady, from plinth table, 5x sit<->squat holds with weight shifts.  Verbal cues to use hips for weight shifts, keep chest tall, bend knees and push bottom back.  Poor graded weight shifts to R side.  Pt excellent compensation by locking out LLE and keeping weight on LLE.  Progressed to toe taps onto stair both rails bilat UE support min A, focusing on gluteal activation, bending RLE and standing tall. Pt continued with poor graded weight shift to R and compensated by locking out LLE and keeping weight shifted to L side. 10x manual facilitation for keeping knee bent vs. Locking out  Progressed to toe taps onto stair R rail with RUE support mod A, focusing on gluteal activation, bending RLE and standing tall.  Pt with difficulty activating R gluteals and keeping tall posture.  4x manual facilitation for not locking knee   Therapy/Group: Individual Therapy  Christianne Dolin 10/25/2011, 2:53 PM

## 2011-10-25 NOTE — Progress Notes (Signed)
Physical Therapy Session Note  Patient Details  Name: [name] MRN: [id number] Date of Birth: 1951-10-10  Today's Date: 10/25/2011 Time: 7829-5621 Time Calculation (min): 30 min  Precautions: Precautions Precautions: Fall Required Braces or Orthoses: No Restrictions Weight Bearing Restrictions: No  Short Term Goals: PT Short Term Goal 1: Pt will perform functional transfers with min A PT Short Term Goal 2: pt will gait 100' in controlled environment with min A PT Short Term Goal 3: Pt will demonstrate dynamic balance during functional activity with min A  Skilled Therapeutic Interventions: Gait training with RW from recliner in room out into hall, 52', with close S; VCs for clearance of R foot via hip flexion to compensate for weak DF, and for forward gaze, wider stance. SPT w/c<> mat with S/min steady A.   Neuromuscular re-ed in sitting for trunk shortening/lengthening with wt shifting, for improved postural responses during gait.   RLE via weight bearing in standing, wt shifting on /off RLE, total BLE extension with calf raises 10 x 1, squats with R><L wt shifting, 10 x 1, all with min A, and UE support.  RLE proximal hip strengthening with closed- chain hip abduction 10 x 1.  Dynamic balance activity for improved ankle strategy RLE with bil wt shifts to heels, bil arms free for balance reactions,  active DF, 10 x 1.  W/C mobility x 30' x 2, using  BUEs with min A, focusing on functional  RUE movements.       General: chart reviewed   Pain Pain Assessment Pain Score:   6; low back and back of head       Therapy/Group: Individual Therapy  Kennard Fildes 10/25/2011, 1:51 PM

## 2011-10-25 NOTE — Progress Notes (Signed)
Occupational Therapy Note  Patient Details  Name: [name] MRN: [id number] Date of Birth: July 26, 1951 Today's Date: 10/25/2011  Time 730-815 ( ) No c/o pain  1:1 self care retraining at shower level. Focus on stand pivot transfers w/c to toilet, short distance functional ambulation with RW: toilet to tub bench to W/c; attention to right hand with min to mod cuing, dominant right hand use for grooming, pushing up from chair, sit to stand, standing static balance during dressing. Pt with anterior lean in standing with delayed balance reactions, min manual facilitation for weight shift with transfers with RW. Overall pt made good progress and carryover from yesterday's session.  Melonie Florida 10/25/2011, 8:08 AM

## 2011-10-25 NOTE — Progress Notes (Signed)
Speech Language Pathology Therapy Note  Patient Details  Name: [name] MRN: [id number] Date of Birth: 11-Jun-1951  Today's Date: 10/25/2011 Time: 1100-1200 Time Calculation (min): 60 min  Precautions: Precautions Precautions: Fall Required Braces or Orthoses: No Restrictions Weight Bearing Restrictions: No  Skilled Therapeutic Interventions: Treatment focus on functional complex problem solving task with balancing a checkbook. Pt with Min A to tend to RUE during tasks but needed supervision question cues to self-monitor and correct errors with use of calculator.  Task made more difficult due to decreased vision without glasses. Pt initiated use of compensatory strategies of writing information down to increase recall of new information. Made a "memory book" to increase organization of information. Supervision verbal cues needed for safety during transfer.   Vital Signs Therapy Vitals BP: 120/80 mmHg  Pain Pain Assessment Pain Score: 0-No pain  Oral/Motor: Motor Speech Intelligibility: Intelligibility reduced Comprehension: Auditory Comprehension Yes/No Questions: Within Functional Limits Commands: Impaired Multistep Basic Commands: 50-74% accurate Conversation: Simple Visual Recognition/Discrimination Discrimination: Within Function Limits Reading Comprehension Reading Status: Within funtional limits Expression: Expression Primary Mode of Expression: Verbal Verbal Expression Initiation: No impairment Level of Generative/Spontaneous Verbalization: Conversation Repetition: No impairment Naming: No impairment Pragmatics: No impairment Interfering Components: Speech intelligibility;Other (comment) (mild dysarthria) Other Verbal Expression Comments: Pt with minimal word-finding deficits. Pt independently utilized comepnsatory strategy of description to increase recall.  Written Expression Dominant Hand: Right Written Expression: Unable to assess (comment) (legibility  impacted by weakness in RUE)  Therapy/Group: Individual Therapy  Justino Boze 10/25/2011 12:03 PM

## 2011-10-25 NOTE — Progress Notes (Signed)
Patient ID: [name], female   DOB: 1951/01/09, 60 y.o.   MRN: [id number] Patient ID: [name], female   DOB: February 13, 1951, 60 y.o.   MRN: [id number] Subjective/Complaints: Review of Systems  Constitutional: Negative.   HENT: Negative.   Eyes: Negative.   Respiratory: Negative.   Cardiovascular: Negative.   Gastrointestinal: Negative.   Genitourinary: Positive for urgency and frequency.  Musculoskeletal: Negative.   Skin: Negative.    Hard time getting to sleep initially  Objective: Vital Signs: Blood pressure 118/82, pulse 74, temperature 98.2 F (36.8 C), temperature source Oral, resp. rate 18, height 5\' 3"  (1.6 m), weight 100.1 kg (220 lb 10.9 oz), SpO2 98.00%. No results found.  Basename 10/24/11 0540  WBC 9.0  HGB 12.2  HCT 37.9  PLT 274    Basename 10/24/11 0540  NA 136  K 3.2*  CL 102  CO2 25  GLUCOSE 149*  BUN 20  CREATININE 0.72  CALCIUM 8.7   CBG (last 3)   Basename 10/24/11 2104 10/24/11 1624 10/24/11 1143  GLUCAP 247* 236* 196*    Wt Readings from Last 3 Encounters:  10/23/11 100.1 kg (220 lb 10.9 oz)  10/22/11 98.4 kg (216 lb 14.9 oz)  09/02/11 98.34 kg (216 lb 12.8 oz)    Physical Exam:  General appearance: alert, cooperative and no distress Head: Normocephalic, without obvious abnormality, atraumatic Eyes: conjunctivae/corneas clear. PERRL, EOM's intact. Fundi benign. Ears: normal TM's and external ear canals both ears Nose: Nares normal. Septum midline. Mucosa normal. No drainage or sinus tenderness. Throat: lips, mucosa, and tongue normal; teeth and gums normal Neck: no adenopathy, no carotid bruit, no JVD, supple, symmetrical, trachea midline and thyroid not enlarged, symmetric, no tenderness/mass/nodules Back: symmetric, no curvature. ROM normal. No CVA tenderness. Resp: clear to auscultation bilaterally Cardio: regular rate and rhythm, S1, S2 normal, no murmur, click, rub or gallop GI: soft, non-tender; bowel sounds normal; no  masses,  no organomegaly Extremities: extremities normal, atraumatic, no cyanosis or edema Pulses: 2+ and symmetric Skin: Skin color, texture, turgor normal. No rashes or lesions Neurologic: She is alert and oriented to person, place, and time.  Mild dysarthria. Decreased visual acuity but no focal visual deficits. No nystagmus is seen. She has diminished fine motor coordination of right arm and right leg. Positive pronator drift is seen on the right. Sensory exam is 1+ out of 2 right arm and right leg. Strength right upper extremity is grossly 4/5 proximal distal. Right lower extremity strength is 3/5 proximal to 4/5 distally. Left upper lower extremity strength are 5 out of 5 grossly. Cognitively patient is alert and appropriate with good insight and awareness.   Incision/Wound: skin clear   Assessment/Plan: 1. 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 Physiatrist is providing close team supervision and 24 hour management of active medical problems listed below. Physiatrist and rehab team continue to assess barriers to discharge/monitor patient progress toward functional and medical goals. Mobility: Bed Mobility Bed Mobility: No Transfers Transfers: Yes Sit to Stand: 4: Min assist Stand to Sit: 4: Min assist Stand Pivot Transfers: 3: Mod assist Ambulation/Gait Ambulation/Gait Assistance: 1: +2 Total assist Ambulation Distance (Feet): 30 Feet Assistive device: 2 person hand held assist Gait Pattern: Shuffle;Trunk flexed;Decreased stride length;Decreased weight shift to right (decreased foot clearance on R) Stairs: Yes Stairs Assistance: 3: Mod assist Stair Management Technique:  One rail Left;Sideways;Step to pattern Number of Stairs: 3  Height of Stairs: 4  Wheelchair Mobility Wheelchair Mobility:  Yes Wheelchair Assistance: 4: Systems analyst: Both upper extremities (needed cues to use R UE) Wheelchair Parts Management: Needs assistance Distance: 30 ADL:   Cognition: Cognition Overall Cognitive Status: Impaired Arousal/Alertness: Awake/alert Orientation Level: Oriented to person;Oriented to place;Oriented to situation Attention: Selective Selective Attention: Appears intact Memory: Appears intact Awareness: Appears intact Problem Solving: Appears intact (called nurse to ask question about medications) Safety/Judgment: Impaired Comments: impulsive when fatigued Cognition Arousal/Alertness: Awake/alert Orientation Level: Oriented to person;Oriented to place;Oriented to situation   2. DVT Prophylaxis/Anticoagulation: Subcutaneous Lovenox as well as support hose. Monitor for any signs of deep vein thrombosis. Monitor platelet counts closely and any bleeding episodes.   3. Uncontrolled diabetes mellitus-hemoglobin A1c of 12.3. Titrate Lantus insulin and NovoLog to desired control. Check blood sugars  a.c. and at bedtime and adjust insulin as needed. Provide full diabetic education. It sounds as if sugars were poorly controlled before.   4. Hypertension-Norvasc 5 mg daily/Lopressor 25 mg twice daily/Lasix 40 mg daily. Monitor closely with increased activity. Secondary education regarding blood pressure control.   -supplement potassium  5. Coronary artery disease with PTCA-continue aspirin therapy as well as Plavix. Patient no current complaints of chest pain and monitor  therapy progresses.   6. COPD/OSA. Continue Combivent inhaler. Check oxygen saturations every shift and applied 2 L oxygen saturations less than 90%.   7. Hyperlipidemia-Crestor   8. Bladder: Patient with baseline incontinence. Place patient on timed voiding schedule. Check PVRs. Catheterize as needed. White the anatomic and neurological components to her incontinence. Urinalysis equivocal.  Urine culture      LOS (Days) 2 A FACE TO FACE EVALUATION WAS PERFORMED  SWARTZ,ZACHARY T 10/25/2011, 7:20 AM

## 2011-10-26 DIAGNOSIS — I10 Essential (primary) hypertension: Secondary | ICD-10-CM

## 2011-10-26 DIAGNOSIS — Z5189 Encounter for other specified aftercare: Secondary | ICD-10-CM

## 2011-10-26 DIAGNOSIS — E1165 Type 2 diabetes mellitus with hyperglycemia: Secondary | ICD-10-CM

## 2011-10-26 DIAGNOSIS — I6330 Cerebral infarction due to thrombosis of unspecified cerebral artery: Secondary | ICD-10-CM

## 2011-10-26 LAB — GLUCOSE, CAPILLARY
Glucose-Capillary: 197 mg/dL — ABNORMAL HIGH (ref 70–99)
Glucose-Capillary: 175 mg/dL — ABNORMAL HIGH (ref 70–99)

## 2011-10-26 NOTE — Progress Notes (Signed)
Occupational Therapy Session Note  Patient Details  Name: [name] MRN: [id number] Date of Birth: 03-17-1951  Today's Date: 10/26/2011 Time:1500-1530 Time Calculation (min): 30 min  Precautions: Precautions Precautions: Fall Required Braces or Orthoses: No Restrictions Weight Bearing Restrictions: No  Short Term Goals: OT Short Term Goal 1: Pt would transfer into tub shower with tub bench with min A OT Short Term Goal 2: Pt would perform toileting with min A OT Short Term Goal 3: Pt would perform toilet transfer with supervision with LRAD OT Short Term Goal 4: Pt would dress LB with min A OT Short Term Goal 5: Pt would able to fix her hair with supervision to show improvement in right UE  Skilled Therapeutic Interventions/Progress Updates:    Performed RUE activities to strengthen ROM, strength.  Pt. Also performed functional mobility in room.  Pt's RUE got tired with activities.   Pain Pain Assessment Pain Assessment: No/denies pain    Therapy/Group: Individual Therapy  Humberto Seals 10/26/2011, 3:31 PM

## 2011-10-26 NOTE — Progress Notes (Signed)
Occupational Therapy Note  Patient Details  Name: [name] MRN: [id number] Date of Birth: 23-Sep-1951 Today's Date: 10/26/2011 Tjime: [phone] Pain:  None Pt engaged in balance, functional mobility, transfer to shower, sit to stand, standing balance.  Pt. Tolerated 45 minutes with activity mostly done in sitting.      Individual Treatment   Humberto Seals 10/26/2011, 10:33 AM

## 2011-10-26 NOTE — Progress Notes (Signed)
Physical Therapy Note  Patient Details  Name: [name] MRN: [id number] Date of Birth: 10-Apr-1951 Today's Date: 10/26/2011  Time 11:15-1200 (45') Pain: patient reports pain 3/10 at B LE's and declines need for medication    Gait training with RW from recliner in room out into hall, 70', with close S; VCs for clearance of R foot via hip flexion to compensate for weak DF, and for forward gaze, wider stance. SPT w/c<> bed with S/min steady A.  Therapeutic Exercise/ Neuromuscular re-ed in sitting for trunk shortening/lengthening with wt shifting, for improved postural responses during gait. RLE via weight bearing in standing, wt shifting on /off RLE, total BLE extension with calf raises 10 x 1, squats with R><L wt shifting, 10 x 1, all with min A, and UE support. RLE proximal hip strengthening with closed- chain hip abduction 10 x 1. Dynamic balance activity for improved ankle strategy RLE with bil wt shifts to heels, bil arms free for balance reactions, active DF, 10 x 1.  W/C mobility x 30' x 2, using BUEs with min A, focusing on functional RUE movements.   Individual Treatment Session  [name] 10/26/2011, 11:30 AM

## 2011-10-26 NOTE — Progress Notes (Signed)
Patient ID: [name], female   DOB: 1951/10/26, 60 y.o.   MRN: [id number] Subjective/Complaints:  Review of Systems  Constitutional: Negative.   HENT: Negative.   Eyes: Negative.   Respiratory: Negative.   Cardiovascular: Negative.   Gastrointestinal: Negative.   Genitourinary: Positive for urgency and frequency.  Musculoskeletal: Negative.   Skin: Negative.    Hard time getting to sleep initially  Objective: Vital Signs: Blood pressure 121/76, pulse 93, temperature 97.8 F (36.6 C), temperature source Oral, resp. rate 20, height 5\' 3"  (1.6 m), weight 100.1 kg (220 lb 10.9 oz), SpO2 98.00%. No results found.  Basename 10/24/11 0540  WBC 9.0  HGB 12.2  HCT 37.9  PLT 274    Basename 10/24/11 0540  NA 136  K 3.2*  CL 102  CO2 25  GLUCOSE 149*  BUN 20  CREATININE 0.72  CALCIUM 8.7   CBG (last 3)   Basename 10/25/11 2125 10/25/11 1617 10/25/11 1200  GLUCAP 232* 131* 122*    Wt Readings from Last 3 Encounters:  10/23/11 100.1 kg (220 lb 10.9 oz)  10/22/11 98.4 kg (216 lb 14.9 oz)  09/02/11 98.34 kg (216 lb 12.8 oz)    Physical Exam:  General appearance: alert, cooperative and no distress Head: Normocephalic, without obvious abnormality, atraumatic Eyes: conjunctivae/corneas clear. PERRL, EOM's intact. Fundi benign. Ears: normal TM's and external ear canals both ears Nose: Nares normal. Septum midline. Mucosa normal. No drainage or sinus tenderness. Throat: lips, mucosa, and tongue normal; teeth and gums normal Neck: no adenopathy, no carotid bruit, no JVD, supple, symmetrical, trachea midline and thyroid not enlarged, symmetric, no tenderness/mass/nodules Back: symmetric, no curvature. ROM normal. No CVA tenderness. Resp: clear to auscultation bilaterally Cardio: regular rate and rhythm, S1, S2 normal, no murmur, click, rub or gallop GI: soft, non-tender; bowel sounds normal; no masses,  no organomegaly Extremities: extremities normal, atraumatic, no  cyanosis or edema Pulses: 2+ and symmetric Skin: Skin color, texture, turgor normal. No rashes or lesions Neurologic: She is alert and oriented to person, place, and time.  Mild dysarthria. Decreased visual acuity but no focal visual deficits. No nystagmus is seen. She has diminished fine motor coordination of right arm and right leg. Positive pronator drift is seen on the right. Sensory exam is 1+ out of 2 right arm and right leg. Strength right upper extremity is grossly 4/5 proximal distal. Right lower extremity strength is 3/5 proximal to 4/5 distally. Left upper lower extremity strength are 5 out of 5 grossly. Cognitively patient is alert and appropriate with good insight and awareness.   Assessment/Plan: 1. 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 Physiatrist is providing close team supervision and 24 hour management of active medical problems listed below. Physiatrist and rehab team continue to assess barriers to discharge/monitor patient progress toward functional and medical goals. Mobility: Bed Mobility Bed Mobility: No Transfers Transfers: Yes Sit to Stand: 4: Min assist Stand to Sit: 4: Min assist Stand Pivot Transfers: 3: Mod assist Ambulation/Gait Ambulation/Gait Assistance: 4: Min assist;Other (comment) (with RW) Ambulation Distance (Feet): 80 Feet Assistive device: Rolling walker Gait Pattern: Shuffle;Trunk flexed;Decreased stride length;Decreased weight shift to right (decreased foot clearance on R) Stairs: Yes Stairs Assistance: 3: Mod assist Stair Management Technique: One rail Left;Sideways;Step to pattern Number of Stairs: 3  Height of Stairs: 4  Wheelchair Mobility Wheelchair Mobility: Yes Wheelchair  Assistance: 4: Min Education officer, museum: Both upper  extremities Wheelchair Parts Management: Needs assistance Distance: 40 ADL:   Cognition: Cognition Overall Cognitive Status: Impaired Arousal/Alertness: Awake/alert Orientation Level: Oriented to person;Oriented to place;Oriented to situation Attention: Selective Selective Attention: Appears intact Memory: Appears intact Awareness: Appears intact Problem Solving: Appears intact (called nurse to ask question about medications) Safety/Judgment: Impaired Comments: impulsive when fatigued Cognition Arousal/Alertness: Awake/alert Orientation Level: Oriented to person;Oriented to place;Oriented to situation   2. DVT Prophylaxis/Anticoagulation: Subcutaneous Lovenox as well as support hose. Monitor for any signs of deep vein thrombosis. Monitor platelet counts closely and any bleeding episodes.   3. Uncontrolled diabetes mellitus-hemoglobin A1c of 12.3. Titrate Lantus insulin and NovoLog to desired control. Check blood sugars  a.c. and at bedtime and adjust insulin as needed. Provide full diabetic education. It sounds as if sugars were poorly controlled before. Mealtime Novalog started 12/14 monitor  4. Hypertension-Norvasc 5 mg daily/Lopressor 25 mg twice daily/Lasix 40 mg daily. Monitor closely with increased activity. Secondary education regarding blood pressure control.   -supplement potassium  5. Coronary artery disease with PTCA-continue aspirin therapy as well as Plavix. Patient no current complaints of chest pain and monitor  therapy progresses.   6. COPD/OSA. Continue Combivent inhaler. Check oxygen saturations every shift and applied 2 L oxygen saturations less than 90%.   7. Hyperlipidemia-Crestor   8. Bladder: Patient with baseline incontinence. Place patient on timed voiding schedule. Check PVRs. Catheterize as needed. White the anatomic and neurological components to her incontinence. Urinalysis equivocal. Urine culture      LOS (Days) 3 A FACE TO FACE EVALUATION WAS  PERFORMED  Heriberto Stmartin E 10/26/2011, 7:01 AM

## 2011-10-26 NOTE — Progress Notes (Signed)
Physical Therapy Note  Patient Details  Name: [name] MRN: [id number] Date of Birth: Feb 14, 1951 Today's Date: 10/26/2011 Time: 1415-1500 (45')  Pain:  Reports 3/10 pain in LE's and declines medication Precautions: Fall Risk, R hemiparesis  Therapeutic Exercise: (15') B LE in sitting w/ hip abduction (manually resisted), LAQ with dorsiflexion, and seated            marching and dynamic standing exercises with hands of RW for balance  Gait Training: (15') using RW 2 x 120' with close Supervision and verbal cues to lift R LE up for better foot clearance.  Wheel chair management: (15') min-A for safety and maneuvering at 3 x 30' and finally removed seat cushion so      patient's feet could reach floor and patient able to propel w/c 100' using hands and feet to maneuver.   Individual Treatment Session   Jodelle Gross 10/26/2011, 2:22 PM

## 2011-10-27 LAB — GLUCOSE, CAPILLARY: Glucose-Capillary: 115 mg/dL — ABNORMAL HIGH (ref 70–99)

## 2011-10-27 MED ORDER — INSULIN GLARGINE 100 UNIT/ML ~~LOC~~ SOLN
75.0000 [IU] | Freq: Every day | SUBCUTANEOUS | Status: DC
  Administered 2011-10-27 – 2011-10-31 (×5): 75 [IU] via SUBCUTANEOUS
  Filled 2011-10-27: qty 3

## 2011-10-27 MED ORDER — GABAPENTIN 300 MG PO CAPS
300.0000 mg | ORAL_CAPSULE | Freq: Every day | ORAL | Status: DC
  Administered 2011-10-27 – 2011-10-29 (×3): 300 mg via ORAL
  Filled 2011-10-27 (×4): qty 1

## 2011-10-27 MED ORDER — GABAPENTIN 600 MG PO TABS
300.0000 mg | ORAL_TABLET | Freq: Every day | ORAL | Status: DC
  Filled 2011-10-27: qty 0.5

## 2011-10-27 NOTE — Progress Notes (Signed)
Patient ID: [name], female   DOB: 1951/08/08, 60 y.o.   MRN: [id number] Subjective/Complaints:  Review of Systems  Constitutional: Negative.   HENT: Negative.   Eyes: Negative.   Respiratory: Negative.   Cardiovascular: Negative.   Gastrointestinal: Negative.   Genitourinary: Positive for urgency and frequency.  Musculoskeletal: Negative.   Skin: Negative.    Hard time getting to sleep initially  Objective: Vital Signs: Blood pressure 114/85, pulse 92, temperature 98.3 F (36.8 C), temperature source Oral, resp. rate 20, height 5\' 3"  (1.6 m), weight 100.1 kg (220 lb 10.9 oz), SpO2 98.00%. No results found. No results found for this basename: WBC:2,HGB:2,HCT:2,PLT:2 in the last 72 hours No results found for this basename: NA:2,K:2,CL:2,CO2:2,GLUCOSE:2,BUN:2,CREATININE:2,CALCIUM:2 in the last 72 hours CBG (last 3)   Basename 10/26/11 2059 10/26/11 1638 10/26/11 1132  GLUCAP 162* 175* 210*    Wt Readings from Last 3 Encounters:  10/23/11 100.1 kg (220 lb 10.9 oz)  10/22/11 98.4 kg (216 lb 14.9 oz)  09/02/11 98.34 kg (216 lb 12.8 oz)    Physical Exam:  General appearance: alert, cooperative and no distress Head: Normocephalic, without obvious abnormality, atraumatic Eyes: conjunctivae/corneas clear. PERRL, EOM's intact. Fundi benign. Ears: normal TM's and external ear canals both ears Nose: Nares normal. Septum midline. Mucosa normal. No drainage or sinus tenderness. Throat: lips, mucosa, and tongue normal; teeth and gums normal Neck: no adenopathy, no carotid bruit, no JVD, supple, symmetrical, trachea midline and thyroid not enlarged, symmetric, no tenderness/mass/nodules Back: symmetric, no curvature. ROM normal. No CVA tenderness. Resp: clear to auscultation bilaterally Cardio: regular rate and rhythm, S1, S2 normal, no murmur, click, rub or gallop GI: soft, non-tender; bowel sounds normal; no masses,  no organomegaly Extremities: extremities normal, atraumatic, no  cyanosis or edema bilateral foot intrinsic atrophy Pulses: 2+ and symmetric Skin: Skin color, texture, turgor normal. No rashes or lesions feet warma dn dry no lesions Neurologic: She is alert and oriented to person, place, and time.  Mild dysarthria. Decreased visual acuity but no focal visual deficits. No nystagmus is seen. She has diminished fine motor coordination of right arm and right leg. Positive pronator drift is seen on the right. Sensory exam is 1+ out of 2 right arm and right leg. Strength right upper extremity is grossly 4/5 proximal distal. Right lower extremity strength is 3/5 proximal to 4/5 distally. Left upper lower extremity strength are 5 out of 5 grossly. Cognitively patient is alert and appropriate with good insight and awareness.   Assessment/Plan: 1. 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 Physiatrist is providing close team supervision and 24 hour management of active medical problems listed below. Physiatrist and rehab team continue to assess barriers to discharge/monitor patient progress toward functional and medical goals. Mobility: Bed Mobility Bed Mobility: No Transfers Transfers: Yes Sit to Stand: 4: Min assist Stand to Sit: 4: Min assist Stand Pivot Transfers: 3: Mod assist Ambulation/Gait Ambulation/Gait Assistance: 4: Min assist;Other (comment) (with RW) Ambulation Distance (Feet): 80 Feet Assistive device: Rolling walker Gait Pattern: Shuffle;Trunk flexed;Decreased stride length;Decreased weight shift to right (decreased foot clearance on R) Stairs: Yes Stairs Assistance: 3: Mod assist Stair Management Technique: One rail Left;Sideways;Step to pattern Number of Stairs: 3  Height of Stairs: 4  Wheelchair Mobility Wheelchair Mobility: Yes Wheelchair Assistance: 4: Chief Operating Officer: Both upper extremities Wheelchair Parts Management: Needs  assistance Distance: 40 ADL:   Cognition: Cognition Overall Cognitive Status: Impaired Arousal/Alertness: Awake/alert Orientation Level: Oriented X4 Attention: Selective Selective Attention: Appears intact Memory: Appears intact Awareness: Appears intact Problem Solving: Appears intact (called nurse to ask question about medications) Safety/Judgment: Impaired Comments: impulsive when fatigued Cognition Arousal/Alertness: Awake/alert Orientation Level: Oriented X4   2. DVT Prophylaxis/Anticoagulation: Subcutaneous Lovenox as well as support hose. Monitor for any signs of deep vein thrombosis. Monitor platelet counts closely and any bleeding episodes.   3. Uncontrolled diabetes mellitus-hemoglobin A1c of 12.3. Titrate Lantus insulin and NovoLog to desired control. Check blood sugars increase Lantus to 75 U  a.c. and at bedtime and adjust insulin as needed. Provide full diabetic education. It sounds as if sugars were poorly controlled before. Mealtime Novalog started 12/14 monitor  4. Hypertension-Norvasc 5 mg daily/Lopressor 25 mg twice daily/Lasix 40 mg daily. Monitor closely with increased activity. Secondary education regarding blood pressure control.   -supplement potassium  5. Coronary artery disease with PTCA-continue aspirin therapy as well as Plavix. Patient no current complaints of chest pain and monitor  therapy progresses.   6. COPD/OSA. Continue Combivent inhaler. Check oxygen saturations every shift and applied 2 L oxygen saturations less than 90%.   7. Hyperlipidemia-Crestor   8. Bladder: Patient with baseline incontinence. Place patient on timed voiding schedule. Check PVRs. Catheterize as needed. White the anatomic and neurological components to her incontinence. Urinalysis equivocal. Urine culture  9. Bilateral foot pain mainly at night probable diabetic neuropathy trial  gabapentin      LOS (Days) 4 A FACE TO FACE EVALUATION WAS PERFORMED  KIRSTEINS,ANDREW E 10/27/2011, 7:04 AM

## 2011-10-27 NOTE — Progress Notes (Signed)
Occupational Therapy Session Note  Patient Details  Name: [name] MRN: [id number] Date of Birth: October 06, 1951  Today's Date: 10/27/2011 Time: 1400-1500 Time Calculation (min): 60 min  Precautions: Precautions Precautions: Fall Required Braces or Orthoses: No Restrictions Weight Bearing Restrictions: No  Short Term Goals: OT Short Term Goal 1: Pt would transfer into tub shower with tub bench with min A OT Short Term Goal 2: Pt would perform toileting with min A OT Short Term Goal 3: Pt would perform toilet transfer with supervision with LRAD OT Short Term Goal 4: Pt would dress LB with min A OT Short Term Goal 5: Pt would able to fix her hair with supervision to show improvement in right UE  Skilled Therapeutic Interventions/Progress Updates:    group UE exercises and emphasis on using patient's right UE seated in w/c - patient resistant to doing any standing activities today; patient tolerated the group session well  General   Vital Signs Therapy Vitals Temp: 98.6 F (37 C) Temp src: Oral Pulse Rate: 95  Resp: 19  BP: 148/86 mmHg Patient Position, if appropriate: Sitting Oxygen Therapy SpO2: 98 % O2 Device: None (Room air) Pain Pain Assessment Pain Score:   4 ADL ADL Eating: Set up;Minimal cueing Where Assessed-Eating: Chair Grooming: Minimal assistance Where Assessed-Grooming: Chair Upper Body Bathing: Setup;Moderate cueing Where Assessed-Upper Body Bathing: Shower Lower Body Bathing: Moderate assistance;Minimal cueing Where Assessed-Lower Body Bathing: Shower Upper Body Dressing: Minimal cueing;Minimal assistance Where Assessed-Upper Body Dressing: Chair Lower Body Dressing: Minimal cueing;Maximal assistance Where Assessed-Lower Body Dressing: Standing at sink;Sitting at sink [employer]: Moderate assistance Film/video editor Method: Optometrist Equipment: Visual merchandiser  Exercises    Therapy/Group:  Group Therapy  Rozelle Logan 10/27/2011, 5:38 PM

## 2011-10-28 LAB — BASIC METABOLIC PANEL
BUN: 22 mg/dL (ref 6–23)
CO2: 23 mEq/L (ref 19–32)
Calcium: 9.3 mg/dL (ref 8.4–10.5)
Chloride: 108 mEq/L (ref 96–112)
Creatinine, Ser: 0.84 mg/dL (ref 0.50–1.10)
Glucose, Bld: 117 mg/dL — ABNORMAL HIGH (ref 70–99)

## 2011-10-28 LAB — GLUCOSE, CAPILLARY
Glucose-Capillary: 80 mg/dL (ref 70–99)
Glucose-Capillary: 81 mg/dL (ref 70–99)

## 2011-10-28 MED ORDER — GLIMEPIRIDE 2 MG PO TABS
2.0000 mg | ORAL_TABLET | Freq: Every day | ORAL | Status: DC
  Administered 2011-10-28 – 2011-10-29 (×2): 2 mg via ORAL
  Filled 2011-10-28 (×4): qty 1

## 2011-10-28 NOTE — Progress Notes (Signed)
Patient ID: [name], female   DOB: July 25, 1951, 60 y.o.   MRN: [id number] Patient ID: [name], female   DOB: 1951/07/13, 60 y.o.   MRN: [id number] Subjective/Complaints:  Review of Systems  Constitutional: Negative.   HENT: Negative.   Eyes: Negative.   Respiratory: Negative.   Cardiovascular: Negative.   Gastrointestinal: Negative.   Genitourinary: Positive for urgency and frequency.  Musculoskeletal: Negative.   Skin: Negative.    No complaints today  Objective: Vital Signs: Blood pressure 113/63, pulse 84, temperature 97.9 F (36.6 C), temperature source Oral, resp. rate 19, height 5\' 3"  (1.6 m), weight 100.1 kg (220 lb 10.9 oz), SpO2 96.00%. No results found. No results found for this basename: WBC:2,HGB:2,HCT:2,PLT:2 in the last 72 hours No results found for this basename: NA:2,K:2,CL:2,CO2:2,GLUCOSE:2,BUN:2,CREATININE:2,CALCIUM:2 in the last 72 hours CBG (last 3)   Basename 10/28/11 0703 10/27/11 2034 10/27/11 1644  GLUCAP 122* 240* 255*    Wt Readings from Last 3 Encounters:  10/23/11 100.1 kg (220 lb 10.9 oz)  10/22/11 98.4 kg (216 lb 14.9 oz)  09/02/11 98.34 kg (216 lb 12.8 oz)    Physical Exam:  General appearance: alert, cooperative and no distress Head: Normocephalic, without obvious abnormality, atraumatic Eyes: conjunctivae/corneas clear. PERRL, EOM's intact. Fundi benign. Ears: normal TM's and external ear canals both ears Nose: Nares normal. Septum midline. Mucosa normal. No drainage or sinus tenderness. Throat: lips, mucosa, and tongue normal; teeth and gums normal Neck: no adenopathy, no carotid bruit, no JVD, supple, symmetrical, trachea midline and thyroid not enlarged, symmetric, no tenderness/mass/nodules Back: symmetric, no curvature. ROM normal. No CVA tenderness. Resp: clear to auscultation bilaterally Cardio: regular rate and rhythm, S1, S2 normal, no murmur, click, rub or gallop GI: soft, non-tender; bowel sounds normal; no masses,  no  organomegaly Extremities: extremities normal, atraumatic, no cyanosis or edema bilateral foot intrinsic atrophy Pulses: 2+ and symmetric Skin: Skin color, texture, turgor normal. No rashes or lesions feet warma dn dry no lesions Neurologic: She is alert and oriented to person, place, and time.  Mild dysarthria. Decreased visual acuity but no focal visual deficits. No nystagmus is seen. She has diminished fine motor coordination of right arm and right leg but is showing improvement. Positive pronator drift is seen on the right. Sensory exam is 1+ out of 2 right arm and right leg. Strength right upper extremity is grossly 4/5 proximal distal. Right lower extremity strength is 3/5 proximal to 4/5 distally. Left upper lower extremity strength are 5 out of 5 grossly. Cognitively patient is alert and appropriate with good insight and awareness.   Assessment/Plan: 1. 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 Physiatrist is providing close team supervision and 24 hour management of active medical problems listed below. Physiatrist and rehab team continue to assess barriers to discharge/monitor patient progress toward functional and medical goals.  Encouraged her to engage the RUE and RLE as much as possible. Mobility: Bed Mobility Bed Mobility: No Transfers Transfers: Yes Sit to Stand: 4: Min assist Stand to Sit: 4: Min assist Stand Pivot Transfers: 3: Mod assist Ambulation/Gait Ambulation/Gait Assistance: 4: Min assist;Other (comment) (with RW) Ambulation Distance (Feet): 80 Feet Assistive device: Rolling walker Gait Pattern: Shuffle;Trunk flexed;Decreased stride length;Decreased weight shift to right (decreased foot clearance on R) Stairs: Yes Stairs Assistance: 3: Mod assist Stair Management Technique: One rail  Left;Sideways;Step to  pattern Number of Stairs: 3  Height of Stairs: 4  Wheelchair Mobility Wheelchair Mobility: Yes Wheelchair Assistance: 4: Systems analyst: Both upper extremities Wheelchair Parts Management: Needs assistance Distance: 40 ADL:   Cognition: Cognition Overall Cognitive Status: Impaired Arousal/Alertness: Awake/alert Orientation Level: Oriented X4 Attention: Selective Selective Attention: Appears intact Memory: Appears intact Awareness: Appears intact Problem Solving: Appears intact (called nurse to ask question about medications) Safety/Judgment: Impaired Comments: impulsive when fatigued Cognition Arousal/Alertness: Awake/alert Orientation Level: Oriented X4   2. DVT Prophylaxis/Anticoagulation: Subcutaneous Lovenox as well as support hose. Monitor for any signs of deep vein thrombosis. Monitor platelet counts closely and any bleeding episodes.   3. Uncontrolled diabetes mellitus-hemoglobin A1c of 12.3. Titrate Lantus insulin and NovoLog to desired control. Check blood sugars increase Lantus to 75 U  a.c. and at bedtime and adjust insulin as needed. Provide full diabetic education. It sounds as if sugars were poorly controlled before. Further adjust novolog today.  4. Hypertension-Norvasc 5 mg daily/Lopressor 25 mg twice daily/Lasix 40 mg daily. Monitor closely with increased activity. Secondary education regarding blood pressure control.   -supplement potassium  5. Coronary artery disease with PTCA-continue aspirin therapy as well as Plavix. Patient no current complaints of chest pain and monitor  therapy progresses.   6. COPD/OSA. Continue Combivent inhaler. Check oxygen saturations every shift and applied 2 L oxygen saturations less than 90%.   7. Hyperlipidemia-Crestor   8. Bladder: Patient with baseline incontinence. Place patient on timed voiding schedule. Check PVRs. Catheterize as needed. White the anatomic and neurological  components to her incontinence. Urinalysis equivocal. Urine culture  9. Bilateral foot pain mainly at night probable diabetic neuropathy trial gabapentin      LOS (Days) 5 A FACE TO FACE EVALUATION WAS PERFORMED  Zahria Ding T 10/28/2011, 7:46 AM

## 2011-10-28 NOTE — Progress Notes (Signed)
Speech Language Pathology Therapy Note  Patient Details  Name: [name] MRN: [id number] Date of Birth: 12/07/1950  Today's Date: 10/28/2011 Time: 1000-1100 Time Calculation (min): 60 min  Precautions: Precautions Precautions: Fall Required Braces or Orthoses: No Restrictions Weight Bearing Restrictions: No  Skilled Therapeutic Interventions: Treatment focus on utilization of memory compensatory strategies to increase recall. Supervision question cue needed to utilize writing information down to increase recall of list of items to locate at the gift shop. Pt navigated to gift shop with Mod I with use of signs. Utilized written list while at the gift shop for recall with Mod I. Pt taught a new card game to clinician with overall Min A question cues and needed Min A verbal and question cues to utilize RUE during game. Pt 100% intelligible during conversational speech.   Pain Pain Assessment Pain Assessment: No/denies pain Pain Score: 0-No pain  Oral/Motor: Motor Speech Intelligibility: Intelligibility reduced Comprehension: Auditory Comprehension Yes/No Questions: Within Functional Limits Commands: Impaired Multistep Basic Commands: 50-74% accurate Conversation: Simple Visual Recognition/Discrimination Discrimination: Within Function Limits Reading Comprehension Reading Status: Within funtional limits Expression: Expression Primary Mode of Expression: Verbal Verbal Expression Initiation: No impairment Level of Generative/Spontaneous Verbalization: Conversation Repetition: No impairment Naming: No impairment Pragmatics: No impairment Interfering Components: Speech intelligibility;Other (comment) (mild dysarthria) Other Verbal Expression Comments: Pt with minimal word-finding deficits. Pt independently utilized comepnsatory strategy of description to increase recall.  Written Expression Dominant Hand: Right Written Expression: Unable to assess (comment) (legibility  impacted by weakness in RUE)  Therapy/Group: Individual Therapy  [name] 10/28/2011 11:07 AM

## 2011-10-28 NOTE — Progress Notes (Signed)
Patient able to tolerate therapy. Ambulating with walker mod assist requires cueing to lift right leg . Patient incontinent of small amt of bowel urgency noted prior to getting to toilet. No complaint of leg pain today . Continue with plan of care.                                                                                                                                         [name]

## 2011-10-28 NOTE — Progress Notes (Signed)
Occupational Therapy Note  Patient Details  Name: [name] MRN: [id number] Date of Birth: [birth year] Today's Date: 10/28/2011  Time 1330-1400 ( ) No c/o pain  1:1 neuro reeducation: Focus on strengthening with use of right hand, in hand manipulation, and FMC in task of wrapping christmas gifts.  Functional ambulation with RW focusing on RW safety , attention to right LE in order to clear foot, navigating obstacles.   Melonie Florida 10/28/2011, 3:30 PM

## 2011-10-28 NOTE — Progress Notes (Signed)
Physical Therapy Session Note  Patient Details  Name: [name] MRN: [id number] Date of Birth: 08-07-1951  Today's Date: 10/28/2011 Time: 0900-0958 Time Calculation (min): 58 min  Precautions: Precautions Precautions: Fall Required Braces or Orthoses: No Restrictions Weight Bearing Restrictions: No  Short Term Goals: PT Short Term Goal 1: Pt will perform functional transfers with min A PT Short Term Goal 2: pt will gait 100' in controlled environment with min A PT Short Term Goal 3: Pt will demonstrate dynamic balance during functional activity with min A  General Chart Reviewed: Yes Vital Signs HR maintained <105bpm throughout entire session.  SpO2 >93% on room air throughout majority of session.  One instance during session, SpO2 dropped to 88%.  Verbal cues for pursed lip breathing with good return demonstration by patient; SpO2 returned to >92% on room air in ~15sec. Pain Pain Assessment Pain Assessment: No/denies pain Pain Score: 0-No pain Mobility Bed Mobility Bed Mobility: Yes Supine to Sit: 5: Supervision Supine to Sit Details: Verbal cues for technique Transfers Transfers: Yes Sit to Stand: 4: Min assist;With upper extremity assist;From bed Sit to Stand Details (indicate cue type and reason): verbal cues for hand placement Stand to Sit: 4: Min assist;With upper extremity assist Stand to Sit Details: verbal cues for hand placement Stand Pivot Transfers: 4: Min assist;Other (comment) (with RW) Stand Pivot Transfer Details: Tactile cues for weight shifting Stand Pivot Transfer Details (indicate cue type and reason): verbal cues for hand placement Locomotion  Ambulation Ambulation: Yes Ambulation/Gait Assistance: 4: Min assist;Other (comment) (with RW) Ambulation Distance (Feet): 144 Feet Assistive device: Rolling walker Ambulation/Gait Assistance Details: Verbal cues for technique Ambulation/Gait Assistance Details (indicate cue type and reason): verbal cues  to lift feet up during swing phase(s) and keep tall posture Gait Gait: Yes Gait Pattern: Impaired Gait Pattern: Decreased weight shift to right;Shuffle;Trunk flexed (decreased foot clearance on R) Stairs / Additional Locomotion Stairs: No Wheelchair Mobility Wheelchair Mobility: No   Other Treatments   Gait ~155ft focusing on gluteal activation, tall posture and R LE clearance during R swing phase.  Verbal cues to activate gluteals and "march" feet while walking to increase R LE foot clearance.  Pt demonstrates stooped posture while walking so she can look at her feet, possibly seeking visual feedback for lower extremities due to bilat. peripheral neuropathy.  Exercises for neuromuscular re-education of gluteals and LE's to improve hip stabilization and RLE activation to improve gait and decrease risk of falls:  Isometric squat holds with R weight shifts 5x min-mod A; verbal cues to keep chest up and squeeze R LE muscles.   Balance in tall kneeling min A on plinth table with toes hanging over edge to compensate for bilat. peripheral neuropathy in feet: -Tall kneeling x -Tall kneeling x with bilat UE movements -Tall kneeling picking up rolling ball and tossing it x 5 progressing to ball tosses 2 x 10  Standing toe tapping on 4" stair with LLE, UE support R rail only 2 x 5; verbal cues to activate R gluteals and RLE muscles, manual facilitation to bend R knee and weight bear through RLE. Pt with increased weight-bearing through RLE this session and less compensation compared to 10/25/2011.  Therapy/Group: Individual Therapy  Christianne Dolin 10/28/2011, 12:31 PM

## 2011-10-28 NOTE — Progress Notes (Signed)
Occupational Therapy Session Note  Patient Details  Name: [name] MRN: [id number] Date of Birth: Apr 01, 1951  Today's Date: 10/28/2011 Time: 0700-0745 Time Calculation (min): 45 min  Precautions: Precautions Precautions: Fall Required Braces or Orthoses: No Restrictions Weight Bearing Restrictions: No  Short Term Goals: OT Short Term Goal 1: Pt would transfer into tub shower with tub bench with min A OT Short Term Goal 2: Pt would perform toileting with min A OT Short Term Goal 3: Pt would perform toilet transfer with supervision with LRAD OT Short Term Goal 4: Pt would dress LB with min A OT Short Term Goal 5: Pt would able to fix her hair with supervision to show improvement in right UE  Skilled Therapeutic Interventions/Progress Updates:    ADL retraining including bathing at shower level in room and dressing with sit to stand from w/c at sink.  Pt amb to BR with r/w at steady assist level.  Pt required assist with bathing feet and buttocks. Pt continues to drop items from right hand primarily when not attending to task.  Pt hyperextends right knee when standing, requiring min verbal cues to correct.  Pt requires assist to don right shoe.  Pt amb to recliner to eat breakfast.  Pt able to open all containers without assistance.  Focus on attention to right/right hand during tasks, dynamic standing balance, transfers, safety awareness, and activity tolerance.  Pain Pain Assessment Pain Assessment: No/denies pain  Therapy/Group: Individual Therapy  Rich Brave 10/28/2011, 7:49 AM

## 2011-10-29 DIAGNOSIS — E1165 Type 2 diabetes mellitus with hyperglycemia: Secondary | ICD-10-CM

## 2011-10-29 DIAGNOSIS — I10 Essential (primary) hypertension: Secondary | ICD-10-CM

## 2011-10-29 DIAGNOSIS — I6330 Cerebral infarction due to thrombosis of unspecified cerebral artery: Secondary | ICD-10-CM

## 2011-10-29 DIAGNOSIS — Z5189 Encounter for other specified aftercare: Secondary | ICD-10-CM

## 2011-10-29 LAB — GLUCOSE, CAPILLARY
Glucose-Capillary: 182 mg/dL — ABNORMAL HIGH (ref 70–99)
Glucose-Capillary: 143 mg/dL — ABNORMAL HIGH (ref 70–99)
Glucose-Capillary: 212 mg/dL — ABNORMAL HIGH (ref 70–99)

## 2011-10-29 MED ORDER — INSULIN ASPART 100 UNIT/ML ~~LOC~~ SOLN
4.0000 [IU] | Freq: Three times a day (TID) | SUBCUTANEOUS | Status: DC
  Administered 2011-10-29 (×3): 4 [IU] via SUBCUTANEOUS

## 2011-10-29 NOTE — Progress Notes (Signed)
Physical Therapy Session Note  Patient Details  Name: [name] MRN: [id number] Date of Birth: Nov 05, 1951  Today's Date: 10/29/2011 Time: 9562-1308 Time Calculation (min): 30 min  Precautions: Precautions Precautions: Fall Required Braces or Orthoses: No Restrictions Weight Bearing Restrictions: No  Short Term Goals: PT Short Term Goal 1: Pt will perform functional transfers with min A PT Short Term Goal 2: pt will gait 100' in controlled environment with min A PT Short Term Goal 3: Pt will demonstrate dynamic balance during functional activity with min A  General Chart Reviewed: Yes Pain Pain Assessment Pain Assessment: No/denies pain Mobility Bed Mobility Bed Mobility: No Transfers Transfers: Yes Sit to Stand: 5: Supervision;With upper extremity assist;With armrests Sit to Stand Details: Verbal cues for precautions/safety;Verbal cues for safe use of DME/AE Sit to Stand Details (indicate cue type and reason): verbal cues to bring walker close and hand placement Stand to Sit: 5: Supervision;With upper extremity assist;With armrests Stand to Sit Details (indicate cue type and reason): Verbal cues for precautions/safety;Verbal cues for safe use of DME/AE;Verbal cues for technique Stand to Sit Details: verbal cues to lock brakes on w/c and reach hands back to find chair before sitting Locomotion  Ambulation Ambulation: Yes Ambulation/Gait Assistance: 4: Min assist Ambulation Distance (Feet): 151 Feet Assistive device: Rolling walker Ambulation/Gait Assistance Details: Verbal cues for technique Ambulation/Gait Assistance Details (indicate cue type and reason): verbal cues to lift up R foot and have wider base of support. Pt required multiple standing rest breaks during gait training Gait Gait: Yes Gait Pattern: Impaired Gait Pattern: Trunk flexed;Shuffle;Step-through pattern;Decreased step length - left;Decreased stance time - Health and safety inspector: Yes Wheelchair Assistance: 5: Financial planner Details: Verbal cues for Engineer, drilling: Both upper extremities Wheelchair Parts Management: Needs assistance (leg rest management) Distance: 150  Other Treatments Car transfer supervision with sedan height car.  Pt with good return demonstration.  Verbal cues to place passenger seat back before entering vehicle.  Gait ~163ft focusing on RLE clearance during swing phase and keeping balance when fatiguing.  Verbal cues to "march" RLE, widen base of support and keep walker close.  Pt continues to fatigue easily and catch her R toe during swing phase; requires mod verbal cues for foot clearance and widen base of support.  Therapy/Group: Individual Therapy  Christianne Dolin 10/29/2011, 2:55 PM

## 2011-10-29 NOTE — Progress Notes (Signed)
Occupational Therapy Note  Patient Details  Name: [name] MRN: [id number] Date of Birth: October 01, 1951 Today's Date: 10/29/2011  Time 730-815(66min) No c/o pain  1:1 self care retraining at shower level in ADL apartment to focus on transferring into tub shower using a tub bench, sit to stand without grab bars (with min A) for standing balance, awareness and attention to right hand, use of right hand at non-dominant level, functional ambulation in room with RW to pick out clothing and in bathroom with cuing for right foot clearance and attention to placement (primary mode of mobility will be with a RW in house); able to don right shoe when untied and propped up on stool to re tie   Melonie Florida 10/29/2011, 8:25 AM

## 2011-10-29 NOTE — Progress Notes (Signed)
Physical Therapy Session Note  Patient Details  Name: [name] MRN: [id number] Date of Birth: December 03, 1950  Today's Date: 10/29/2011 Time: 1016-1100 Time Calculation (min): 44 min  Precautions: Precautions Precautions: Fall Required Braces or Orthoses: No Restrictions Weight Bearing Restrictions: No  Short Term Goals: PT Short Term Goal 1: Pt will perform functional transfers with min A PT Short Term Goal 2: pt will gait 100' in controlled environment with min A PT Short Term Goal 3: Pt will demonstrate dynamic balance during functional activity with min A  Pain Pain Assessment Pain Assessment: No/denies pain  Locomotion  Stairs / Additional Locomotion Stairs: Yes Stairs Assistance: 3: Mod assist Stairs Assistance Details: Tactile cues for initiation;Tactile cues for sequencing;Verbal cues for sequencing;Verbal cues for technique;Verbal cues for precautions/safety;Verbal cues for gait pattern Stairs Assistance Details (indicate cue type and reason): Up and down 5 stairs with 2 rails and cues for safety, sequence and weight shifting laterally to R and anterior to bring COG over R stance LE.   Stair Management Technique: Two rails Number of Stairs: 5  Height of Stairs: 6  Wheelchair Mobility Wheelchair Mobility: Yes Wheelchair Assistance: 5: Financial planner Details: Verbal cues for Engineer, drilling: Both upper extremities;Left lower extremity Wheelchair Parts Management: Needs assistance Distance: 150   Other Treatments Treatments Therapeutic Activity: Patient performed w/c mobility with multiple rest breaks and supervision for safety around obstacles only; performed w/c > bed with stand pivot to R side with mod A for sequencing and stability while advancing RLE; bed > w/c with squat pivot to L side and min A for sequencing.  Performed bed mobility and rolling L and R training with focus on timing and sequencing of activation of  upper and lower trunk muscles for rotation and pelvic depression for side to sit to minimize use of extensor pattern.    Therapy/Group: Individual Therapy  Edman Circle [facility] 10/29/2011, 12:46 PM

## 2011-10-29 NOTE — Progress Notes (Signed)
Speech Language Pathology Therapy Note  Patient Details  Name: CAMARI QUINTANILLA MRN: [id number] Date of Birth: 11/18/1950  Today's Date: 10/29/2011 Time: 1500-1600 Time Calculation (min): 60 min  Precautions: Precautions Precautions: Fall Required Braces or Orthoses: No Restrictions Weight Bearing Restrictions: No  Skilled Therapeutic Interventions: Treatment focus on functional higher-level problem solving with cooking task.  Pt with Mod I for problem solving in mildly distracting environment with supervision verbal cues for slow pace to increase speech intelligibility during conversational speech.   Precautions/Restrictions  Precautions Precautions: Fall Required Braces or Orthoses: No Restrictions Weight Bearing Restrictions: No  Vital Signs Therapy Vitals Temp: 97.6 F (36.4 C) Temp src: Oral Pulse Rate: 98  Resp: 22  BP: 118/63 mmHg Patient Position, if appropriate: Sitting Oxygen Therapy SpO2: 95 % O2 Device: None (Room air) Pain Pain Assessment Pain Assessment: No/denies pain  Oral/Motor: Motor Speech Intelligibility: Intelligibility reduced Comprehension: Auditory Comprehension Yes/No Questions: Within Functional Limits Commands: Impaired Multistep Basic Commands: 50-74% accurate Conversation: Simple Visual Recognition/Discrimination Discrimination: Within Function Limits Reading Comprehension Reading Status: Within funtional limits Expression: Expression Primary Mode of Expression: Verbal Verbal Expression Initiation: No impairment Level of Generative/Spontaneous Verbalization: Conversation Repetition: No impairment Naming: No impairment Pragmatics: No impairment Interfering Components: Speech intelligibility;Other (comment) (mild dysarthria) Other Verbal Expression Comments: Pt with minimal word-finding deficits. Pt independently utilized comepnsatory strategy of description to increase recall.  Written Expression Dominant Hand: Right Written  Expression: Unable to assess (comment) (legibility impacted by weakness in RUE)  Therapy/Group: Individual Therapy  Clytie Shetley 10/29/2011 4:55 PM

## 2011-10-29 NOTE — Patient Care Conference (Signed)
Inpatient RehabilitationTeam Conference Note Date: 10/29/2011   Time: 7:26 PM    Patient Name: [name]      Medical Record Number: [id number]  Date of Birth: Mar 15, 1951 Sex: Female         Room/Bed: 4005/4005-01 Payor Info: Payor: MEDICARE  Plan: MEDICARE PART A AND B  Product Type: *No Product type*     Admitting Diagnosis: LT CVA  Admit Date/Time:  10/23/2011  2:03 PM Admission Comments: No comment available   Primary Diagnosis:  CVA (cerebral infarction) Principal Problem: CVA (cerebral infarction)  Patient Active Problem List  Diagnoses Date Noted  . CVA (cerebral infarction) 10/23/2011  . CVA (cerebral vascular accident) 10/19/2011  . Hemiparesis, acute 10/19/2011  . DM type 2 (diabetes mellitus, type 2) 10/19/2011  . Hypokalemia 10/19/2011  . PAD (peripheral artery disease)   . COPD (chronic obstructive pulmonary disease)   . Systolic and diastolic CHF, chronic   . CAD (coronary artery disease)   . Hypertension   . Hyperlipidemia   . Ejection fraction < 50%   . Diastolic dysfunction   . Syncope   . OSA (obstructive sleep apnea) 06/04/2011    Expected Discharge Date: Expected Discharge Date: 11/01/11  Team Members Present: Physician: Dr. Faith Rogue Case Manager Present: Melanee Spry, RN Social Worker Present: Amada Jupiter, LCSW PT Present: Reggy Eye, PT OT Present: Roney Mans, OT;Ardis Rowan, Corky Crafts, OT SLP Present: Feliberto Gottron, SLP Other (Discipline and Name): Tora Duck, PPS Coordinator RN Present: Carmie End    Current Status/Progress Goal Weekly Team Focus  Medical   improving coordination and functional use of right sided extremities.  tolerating current diet.  sugars need work  improved glycemic control   diabetes regimen adjustment, patient ed   Bowel/Bladder   contininet of bladder wears incontinet brief and incontinent of small amt of bm due to urgency   mod independent   continue with toileting prn   Swallow/Nutrition/  Hydration             ADL's   min A  supervision  safety with RW, activity tolerance, right hand use, family education   Mobility   min A - mod A  supervision  R LE wt bearing, balance, activity tolerance   Communication   Supervision  Mod I  Utilization of speech intelligibility strategies   Safety/Cognition/ Behavioral Observations  Supervision  Mod I  Complex problem solving, working memory with use of strategies   Pain   no complaint today previous report of bil leg pain   tylenol 650mg  prn   monitor effectiveness of scheduled neurontin and tylenol    Skin   n/a            *See Interdisciplinary Assessment and Plan and progress notes for long and short-term goals  Barriers to Discharge: none identified presently    Possible Resolutions to Barriers:       Discharge Planning/Teaching Needs:  home with daughter and son-lin-law available to provide 24 hour per day assistance      Team Discussion: Good gains--needs more ed about DM, BP management.  Need daughter in for family ed.   Revisions to Treatment Plan: none    Continued Need for Acute Rehabilitation Level of Care: The patient requires daily medical management by a physician with specialized training in physical medicine and rehabilitation for the following conditions: Daily direction of a multidisciplinary physical rehabilitation program to ensure safe treatment while eliciting the highest outcome that is of practical value  to the patient.: Yes Daily medical management of patient stability for increased activity during participation in an intensive rehabilitation regime.: Yes Daily analysis of laboratory values and/or radiology reports with any subsequent need for medication adjustment of medical intervention for : Neurological problems;Other  Pt very pleased about d/c for 11/01/11.  Daughter also agreeable.  Family ed time to be arranged.  Brock Ra 10/29/2011, 7:26 PM

## 2011-10-29 NOTE — Progress Notes (Signed)
Patient Details  Name: [name] MRN: [id number] Date of Birth: 26-Aug-1951  Today's Date: 10/29/2011 Time: 3:35-400 Skilled Therapeutic Interventions/Progress Updates: Pt participated in simple meal prep seated w/c level with set up assist.  No c/o pain.   Activity Level: Simple:  Level of assist: Supervision  Bronislaus Verdell 10/29/2011, 4:44 PM

## 2011-10-29 NOTE — Progress Notes (Signed)
Patient ID: [name], female   DOB: 06/22/1951, 60 y.o.   MRN: [id number] Patient ID: [name], female   DOB: 18-Dec-1950, 60 y.o.   MRN: [id number] Patient ID: [name], female   DOB: [birth year], 60 y.o.   MRN: [id number] Subjective/Complaints:  Review of Systems  Constitutional: Negative.   HENT: Negative.   Eyes: Negative.   Respiratory: Negative.   Cardiovascular: Negative.   Gastrointestinal: Negative.   Genitourinary: Positive for urgency and frequency.  Musculoskeletal: Negative.   Skin: Negative.    No complaints today  Objective: Vital Signs: Blood pressure 123/73, pulse 86, temperature 97.9 F (36.6 C), temperature source Oral, resp. rate 20, height 5\' 3"  (1.6 m), weight 100.1 kg (220 lb 10.9 oz), SpO2 96.00%. No results found. No results found for this basename: WBC:2,HGB:2,HCT:2,PLT:2 in the last 72 hours  Basename 10/28/11 0600  NA 142  K 3.8  CL 108  CO2 23  GLUCOSE 117*  BUN 22  CREATININE 0.84  CALCIUM 9.3   CBG (last 3)   Basename 10/28/11 1952 10/28/11 1610 10/28/11 1116  GLUCAP 184* 81 80    Wt Readings from Last 3 Encounters:  10/23/11 100.1 kg (220 lb 10.9 oz)  10/22/11 98.4 kg (216 lb 14.9 oz)  09/02/11 98.34 kg (216 lb 12.8 oz)    Physical Exam:  General appearance: alert, cooperative and no distress Head: Normocephalic, without obvious abnormality, atraumatic Eyes: conjunctivae/corneas clear. PERRL, EOM's intact. Fundi benign. Ears: normal TM's and external ear canals both ears Nose: Nares normal. Septum midline. Mucosa normal. No drainage or sinus tenderness. Throat: lips, mucosa, and tongue normal; teeth and gums normal Neck: no adenopathy, no carotid bruit, no JVD, supple, symmetrical, trachea midline and thyroid not enlarged, symmetric, no tenderness/mass/nodules Back: symmetric, no curvature. ROM normal. No CVA tenderness. Resp: clear to auscultation bilaterally Cardio: regular rate and rhythm, S1, S2 normal, no murmur,  click, rub or gallop GI: soft, non-tender; bowel sounds normal; no masses,  no organomegaly Extremities: extremities normal, atraumatic, no cyanosis or edema bilateral foot intrinsic atrophy Pulses: 2+ and symmetric Skin: Skin color, texture, turgor normal. No rashes or lesions feet warma dn dry no lesions Neurologic: She is alert and oriented to person, place, and time.  Mild dysarthria. Decreased visual acuity but no focal visual deficits. No nystagmus is seen. She has diminished fine motor coordination of right arm and right leg but is showing improvement. Decreased pronator drift is seen on the right. Sensory exam is 1+ out of 2 right arm and right leg. Strength right upper extremity is grossly 4/5 proximal distal. Right lower extremity strength is 3/5 proximal to 4/5 distally. Left upper lower extremity strength are 5 out of 5 grossly. Cognitively patient is alert and appropriate with good insight and awareness.   Assessment/Plan: 1. 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 Physiatrist is providing close team supervision and 24 hour management of active medical problems listed below. Physiatrist and rehab team continue to assess barriers to discharge/monitor patient progress toward functional and medical goals.  Encouraged her to engage the RUE and RLE as much as possible. Mobility: Bed Mobility Bed Mobility: Yes Supine to Sit: 5: Supervision Transfers Transfers: Yes Sit to Stand: 4: Min assist;With upper extremity assist;From bed Sit to Stand Details (indicate cue type and reason): verbal cues for hand placement Stand to Sit:  4: Min assist;With upper extremity assist Stand to Sit Details: verbal cues for hand placement Stand Pivot Transfers: 4: Min assist;Other (comment) (with RW) Stand Pivot Transfer  Details (indicate cue type and reason): verbal cues for hand placement Ambulation/Gait Ambulation/Gait Assistance: 4: Min assist;Other (comment) (with RW) Ambulation/Gait Assistance Details (indicate cue type and reason): verbal cues to lift feet up during swing phase(s) and keep tall posture Ambulation Distance (Feet): 144 Feet Assistive device: Rolling walker Gait Pattern: Decreased weight shift to right;Shuffle;Trunk flexed (decreased foot clearance on R) Stairs: No Stairs Assistance: 3: Mod assist Stair Management Technique: One rail Left;Sideways;Step to pattern Number of Stairs: 3  Height of Stairs: 4  Wheelchair Mobility Wheelchair Mobility: No Wheelchair Assistance: 4: Systems analyst: Both upper extremities Wheelchair Parts Management: Needs assistance Distance: 40 ADL:   Cognition: Cognition Overall Cognitive Status: Impaired Arousal/Alertness: Awake/alert Orientation Level: Oriented X4 Attention: Selective Selective Attention: Appears intact Memory: Appears intact Awareness: Appears intact Problem Solving: Appears intact (called nurse to ask question about medications) Safety/Judgment: Impaired Comments: impulsive when fatigued Cognition Arousal/Alertness: Awake/alert Orientation Level: Oriented X4   2. DVT Prophylaxis/Anticoagulation: Subcutaneous Lovenox as well as support hose. Monitor for any signs of deep vein thrombosis. Monitor platelet counts closely and any bleeding episodes.   3. Uncontrolled diabetes mellitus-hemoglobin A1c of 12.3.  Lantus  75 units qhs.  full diabetic education. It sounds as if sugars were poorly controlled before. Mealtime novolog adjusted down today as the addition of the amaryl dropped the day time cbg's yesterday.  4. Hypertension-Norvasc 5 mg daily/Lopressor 25 mg twice daily/Lasix 40 mg daily. Monitor closely with increased activity. Secondary education regarding blood pressure control.   -supplement  potassium  5. Coronary artery disease with PTCA-continue aspirin therapy as well as Plavix. Patient no current complaints of chest pain and monitor  therapy progresses.   6. COPD/OSA. Continue Combivent inhaler. Check oxygen saturations every shift and applied 2 L oxygen saturations less than 90%.   7. Hyperlipidemia-Crestor   8. Bladder: Patient with baseline incontinence. Place patient on timed voiding schedule. Check PVRs. Catheterize as needed. White the anatomic and neurological components to her incontinence. Urinalysis equivocal. Urine culture  9. Bilateral foot pain mainly at night probable diabetic neuropathy trial gabapentin      LOS (Days) 6 A FACE TO FACE EVALUATION WAS PERFORMED  SWARTZ,ZACHARY T 10/29/2011, 7:18 AM

## 2011-10-29 NOTE — Progress Notes (Signed)
Patient participating in therapy . Complains of bilateral lower extremity pain and ache . Thigh high ted hose intact. tylenol 650 mg po given see mar, with out relief. Heat compress applied to left leg. Reviewed diabetic education with patient no family present. Patient reports she uses insulin and syringes at home not insulin pens.  Min assist to ambulate with walker right leg weak . Cues required to keep head up and not look down when ambulating. Continue with plan of care.                                                                                                      [name]

## 2011-10-30 LAB — GLUCOSE, CAPILLARY: Glucose-Capillary: 138 mg/dL — ABNORMAL HIGH (ref 70–99)

## 2011-10-30 MED ORDER — GLIMEPIRIDE 4 MG PO TABS
4.0000 mg | ORAL_TABLET | Freq: Every day | ORAL | Status: DC
  Administered 2011-10-30 – 2011-11-01 (×3): 4 mg via ORAL
  Filled 2011-10-30 (×5): qty 1

## 2011-10-30 MED ORDER — GLIMEPIRIDE 4 MG PO TABS: 4.0000 mg | ORAL_TABLET | Freq: Every day | ORAL | Status: DC

## 2011-10-30 MED ORDER — INSULIN GLARGINE 100 UNIT/ML ~~LOC~~ SOLN: 75.0000 [IU] | Freq: Every day | SUBCUTANEOUS | Status: DC

## 2011-10-30 MED ORDER — POTASSIUM CHLORIDE CRYS ER 20 MEQ PO TBCR: 20.0000 meq | EXTENDED_RELEASE_TABLET | Freq: Every day | ORAL | Status: DC

## 2011-10-30 NOTE — Progress Notes (Signed)
Physical Therapy Session Note  Patient Details  Name: [name] MRN: [id number] Date of Birth: Aug 29, 1951  Today's Date: 10/30/2011 Time: 1100-1156 Time Calculation (min): 56 min  Precautions: Precautions Precautions: Fall Required Braces or Orthoses: No Restrictions Weight Bearing Restrictions: No  Short Term Goals: PT Short Term Goal 1: Pt will perform functional transfers with min A PT Short Term Goal 2: pt will gait 100' in controlled environment with min A PT Short Term Goal 3: Pt will demonstrate dynamic balance during functional activity with min A  General Chart Reviewed: Yes Vital Signs HR <105bpm and SpO2 >92% on room air throughout entire session. Pain Pain Assessment Pain Assessment: No/denies pain Pain Score: 0-No pain Mobility Bed Mobility Bed Mobility: No Transfers Transfers: Yes Sit to Stand: 5: Supervision;With upper extremity assist;With armrests Sit to Stand Details: Verbal cues for precautions/safety Sit to Stand Details (indicate cue type and reason): verbal cues to keep walker close and hand placement Stand to Sit: 5: Supervision;With upper extremity assist;With armrests Stand to Sit Details (indicate cue type and reason): Verbal cues for technique Stand to Sit Details: verbal cues for hand placement Locomotion  Ambulation Ambulation: Yes Ambulation/Gait Assistance: 5: Supervision Ambulation Distance (Feet): 166 Feet Assistive device: Rolling walker Ambulation/Gait Assistance Details: Verbal cues for technique Ambulation/Gait Assistance Details (indicate cue type and reason): verbal cues to lift up R foot and have wider base of support Gait Gait: Yes Gait Pattern: Impaired Gait Pattern: Step-through pattern;Decreased step length - right;Decreased step length - left;Trunk flexed;Shuffle Stairs / Management consultant Assistance: 3: Mod assist Stairs Assistance Details: Manual facilitation for weight shifting;Verbal cues for  technique;Verbal cues for precautions/safety Stairs Assistance Details (indicate cue type and reason): Up and down 2 stairs with L rail and bilat UE support with cues for safety and technique Stair Management Technique: One rail Left;Step to pattern Wheelchair Mobility Wheelchair Mobility: No  Other Treatments   Gait ~259ft min A with RW focusing on RLE clearance and wider base of support to improve functional mobility.  Exercises to increase RLE weight-bearing to improve functional mobility and decrease risk of falls:  Isometric squats min-mod A with graded weight shifts to R; verbal cues to step in front of table and activate gluteals  Dynamic balance activity min A with horseshoes in isometric squat position reaching with RUE (high and low); verbal cues to activate gluteals  Stepping onto stair with LLE right rail RUE support min A with tactile cues to bend RLE and verbal cues to activate gluteals.  Progressed to stepping onto stair no UE support mod A with tactile cues to bend RLE and verbal cues to activate gluteals.  Stairs mod A 2 stairs up/down x 2 trials focusing on technique and safety; verbal cues given for hand and foot placement and technique.  Pt placing more weight through RLE when asked, but fatigues quickly (~3sec) and prefers to keep all of her weight on her LLE, locking it out.  When given any chance to compensate, she will, including leaning against the plinth table in isometric squat position, using LUE support when leaning to right in squat position or leaning heavily on RUE when doing toe taps on stair.  Pt needs min verbal cues for hand placement for sit<>stand.  Therapy/Group: Individual Therapy  Christianne Dolin 10/30/2011, 12:57 PM

## 2011-10-30 NOTE — Plan of Care (Signed)
Problem: RH BLADDER ELIMINATION Goal: RH STG MANAGE BLADDER WITH EQUIPMENT WITH ASSISTANCE Min assist of staff/caregiver  Outcome: Completed/Met Date Met:  10/30/11 Pt is modified independent with bladder management.

## 2011-10-30 NOTE — Progress Notes (Signed)
Physical Therapy Note  Patient Details  Name: KATONYA BLECHER MRN: [id number] Date of Birth: [birth year] Today's Date: 10/30/2011  Time: 1400-1430 30 minutes Pt participated in gait activities with musical chair games.  Pt required supervision-min A for turning, sidestepping and turning with gait on carpet with RW.  Individual therapy   Samy Ryner 10/30/2011, 4:07 PM

## 2011-10-30 NOTE — Plan of Care (Signed)
Problem: RH SKIN INTEGRITY Goal: RH STG SKIN FREE OF INFECTION/BREAKDOWN Min assist of caregiver/staff for daily inspection of skin  Outcome: Progressing No skin issues or breakdown noted at this time.

## 2011-10-30 NOTE — Plan of Care (Signed)
Problem: RH BLADDER ELIMINATION Goal: RH STG MANAGE BLADDER WITH EQUIPMENT WITH ASSISTANCE Pt will have 3 low volume PVR< than 300 cc  Outcome: Progressing Patient emptying bladder

## 2011-10-30 NOTE — Plan of Care (Signed)
Problem: RH SAFETY Goal: RH STG ADHERE TO SAFETY PRECAUTIONS W/ASSISTANCE/DEVICE Supervision of staff and caregiver  Outcome: Progressing Pt has been calling for assistance with mobility attempts.

## 2011-10-30 NOTE — Progress Notes (Signed)
Speech Language Pathology Therapy Note  Patient Details  Name: ALLISEN PIDGEON MRN: [id number] Date of Birth: 1951-03-02  Today's Date: 10/30/2011 Time: 4098-1191 Time Calculation (min): 45 min  Precautions: Precautions Precautions: Fall Required Braces or Orthoses: No Restrictions Weight Bearing Restrictions: No  Skilled Therapeutic Interventions: Treatment focus on functional problem solving and working memory. Pt independently recalled morning therapy and reported she is utilizing visual aids (wiritng info down) to increase recall of newly learned information. Pt 100% intelligible during task but needed supervision verbal cues to decrease rate during conversation. Overall supervision with question cues for safety with transfers.   Precautions/Restrictions  Restrictions Weight Bearing Restrictions: No  Pain Pain Assessment Pain Assessment: No/denies pain Pain Score: 0-No pain   Therapy/Group: Individual Therapy  Zaim Nitta 10/30/2011 12:23 PM

## 2011-10-30 NOTE — Plan of Care (Signed)
Problem: RH PAIN MANAGEMENT Goal: RH STG PAIN MANAGED AT OR BELOW PT'S PAIN GOAL Pain to be 5 or less  Outcome: Progressing Pt. Pain has not impacted physical functioning or therapy participation.

## 2011-10-30 NOTE — Plan of Care (Signed)
Problem: Consults Goal: RH STROKE PATIENT EDUCATION See Patient Education module for education specifics  Stroke education continues with patient.

## 2011-10-30 NOTE — Progress Notes (Signed)
Occupational Therapy Note  Patient Details  Name: [name] MRN: [id number] Date of Birth: 1951-11-02 Today's Date: 10/30/2011  Time 800-845 ( ) No c/o pain - reported yesterday her lower back and legs were bothering her.  1:1 self care retraining at shower level in her room with focus on more distant supervision with bathing sit to standing, standing balance without UE support during clothing management, obtaining clothes using RW safely, functional ambulation with attention to right foot and placement with each advancement, brushing and fixing hair with right hand dominance. Practiced [facility] and in hand manipulation with small familiar objects with different grasps.  Melonie Florida 10/30/2011, 8:58 AM

## 2011-10-30 NOTE — Progress Notes (Signed)
Patient ID: [name], female   DOB: December 27, 1950, 60 y.o.   MRN: [id number] Patient ID: [name], female   DOB: 1951/10/21, 60 y.o.   MRN: [id number] Patient ID: [name], female   DOB: Mar 18, 1951, 60 y.o.   MRN: [id number] Patient ID: [name], female   DOB: 06/09/1951, 61 y.o.   MRN: [id number] Subjective/Complaints:  Review of Systems  Constitutional: Negative.   HENT: Negative.   Eyes: Negative.   Respiratory: Negative.   Cardiovascular: Negative.   Gastrointestinal: Negative.   Genitourinary: Positive for urgency and frequency.  Musculoskeletal: Negative.   Skin: Negative.    Neurontin makes her drowsy Objective: Vital Signs: Blood pressure 102/68, pulse 81, temperature 97.7 F (36.5 C), temperature source Oral, resp. rate 20, height 5\' 3"  (1.6 m), weight 100.1 kg (220 lb 10.9 oz), SpO2 100.00%. No results found. No results found for this basename: WBC:2,HGB:2,HCT:2,PLT:2 in the last 72 hours  Basename 10/28/11 0600  NA 142  K 3.8  CL 108  CO2 23  GLUCOSE 117*  BUN 22  CREATININE 0.84  CALCIUM 9.3   CBG (last 3)   Basename 10/29/11 2004 10/29/11 1613 10/29/11 1142  GLUCAP 212* 184* 143*    Wt Readings from Last 3 Encounters:  10/23/11 100.1 kg (220 lb 10.9 oz)  10/22/11 98.4 kg (216 lb 14.9 oz)  09/02/11 98.34 kg (216 lb 12.8 oz)    Physical Exam:  General appearance: alert, cooperative and no distress Head: Normocephalic, without obvious abnormality, atraumatic Eyes: conjunctivae/corneas clear. PERRL, EOM's intact. Fundi benign. Ears: normal TM's and external ear canals both ears Nose: Nares normal. Septum midline. Mucosa normal. No drainage or sinus tenderness. Throat: lips, mucosa, and tongue normal; teeth and gums normal Neck: no adenopathy, no carotid bruit, no JVD, supple, symmetrical, trachea midline and thyroid not enlarged, symmetric, no tenderness/mass/nodules Back: symmetric, no curvature. ROM normal. No CVA tenderness. Resp: clear to  auscultation bilaterally Cardio: regular rate and rhythm, S1, S2 normal, no murmur, click, rub or gallop GI: soft, non-tender; bowel sounds normal; no masses,  no organomegaly Extremities: extremities normal, atraumatic, no cyanosis or edema bilateral foot intrinsic atrophy Pulses: 2+ and symmetric Skin: Skin color, texture, turgor normal. No rashes or lesions feet warma dn dry no lesions Neurologic: She is alert and oriented to person, place, and time.  Mild dysarthria. Decreased visual acuity but no focal visual deficits. No nystagmus is seen. She has diminished fine motor coordination of right arm and right leg but is showing improvement. Decreased pronator drift is seen on the right. Sensory exam is 1+ out of 2 right arm and right leg. Strength right upper extremity is grossly 4/5 proximal distal. Right lower extremity strength is 3/5 proximal to 4/5 distally. Left upper lower extremity strength are 5 out of 5 grossly. Cognitively patient is alert and appropriate with good insight and awareness.   Assessment/Plan: 1. 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 Physiatrist is providing close team supervision and 24 hour management of active medical problems listed below. Physiatrist and rehab team continue to assess barriers to discharge/monitor patient progress toward functional and medical goals.  Encouraged her to engage the RUE and RLE as much as possible. Mobility: Bed Mobility Bed Mobility: No Supine to Sit: 5: Supervision Transfers Transfers: Yes Sit to Stand: 5: Supervision;With upper extremity assist;With armrests Sit to  Stand Details (indicate cue type and reason): verbal cues to bring walker close and hand placement Stand to Sit: 5: Supervision;With upper extremity assist;With armrests Stand to Sit  Details: verbal cues to lock brakes on w/c and reach hands back to find chair before sitting Stand Pivot Transfers: 4: Min assist;Other (comment) (with RW) Stand Pivot Transfer Details (indicate cue type and reason): verbal cues for hand placement Ambulation/Gait Ambulation/Gait Assistance: 4: Min assist Ambulation/Gait Assistance Details (indicate cue type and reason): verbal cues to lift up R foot and have wider base of support Ambulation Distance (Feet): 151 Feet Assistive device: Rolling walker Gait Pattern: Trunk flexed;Shuffle;Step-through pattern;Decreased step length - left;Decreased stance time - right Stairs: No Stairs Assistance: 3: Mod assist Stairs Assistance Details (indicate cue type and reason): Up and down 5 stairs with 2 rails and cues for safety, sequence and weight shifting laterally to R and anterior to bring COG over R stance LE.   Stair Management Technique: Two rails Number of Stairs: 5  Height of Stairs: 6  Wheelchair Mobility Wheelchair Mobility: Yes Wheelchair Assistance: 5: Investment banker, operational: Both upper extremities Wheelchair Parts Management: Needs assistance (leg rest management) Distance: 150 ADL:   Cognition: Cognition Overall Cognitive Status: Impaired Arousal/Alertness: Awake/alert Orientation Level: Oriented X4 Attention: Selective Selective Attention: Appears intact Memory: Appears intact Awareness: Appears intact Problem Solving: Appears intact (called nurse to ask question about medications) Safety/Judgment: Impaired Comments: impulsive when fatigued Cognition Arousal/Alertness: Awake/alert Orientation Level: Oriented X4   2. DVT Prophylaxis/Anticoagulation: Subcutaneous Lovenox as well as support hose. Monitor for any signs of deep vein thrombosis. Monitor platelet counts closely and any bleeding episodes.   3. Uncontrolled diabetes mellitus-hemoglobin A1c of 12.3.  Lantus  75 units qhs.  full diabetic education. It  sounds as if sugars were poorly controlled before. Will increase amaryl and stop mealtime novolog to simpify home regimen.  4. Hypertension-Norvasc 5 mg daily/Lopressor 25 mg twice daily/Lasix 40 mg daily. Monitor closely with increased activity. Secondary education regarding blood pressure control.   -supplement potassium  5. Coronary artery disease with PTCA-continue aspirin therapy as well as Plavix. Patient no current complaints of chest pain and monitor  therapy progresses.   6. COPD/OSA. Continue Combivent inhaler. Doing well  7. Hyperlipidemia-Crestor   8. Bladder: Patient with baseline incontinence. Place patient on timed voiding schedule. Check PVRs. Catheterize as needed. White the anatomic and neurological components to her incontinence.  9. Bilateral foot pain mainly at night probable diabetic neuropathy.  Will stop neurontin per pt request.       LOS (Days) 7 A FACE TO FACE EVALUATION WAS PERFORMED  Athaliah Baumbach T 10/30/2011, 7:34 AM

## 2011-10-30 NOTE — Plan of Care (Signed)
Problem: RH BOWEL ELIMINATION Goal: RH STG MANAGE BOWEL WITH ASSISTANCE Modified independence  Outcome: Progressing Bowel movement 12-17, continue prn meds and diet approach.

## 2011-10-31 LAB — GLUCOSE, CAPILLARY
Glucose-Capillary: 148 mg/dL — ABNORMAL HIGH (ref 70–99)
Glucose-Capillary: 218 mg/dL — ABNORMAL HIGH (ref 70–99)
Glucose-Capillary: 128 mg/dL — ABNORMAL HIGH (ref 70–99)

## 2011-10-31 MED ORDER — GABAPENTIN 100 MG PO CAPS
100.0000 mg | ORAL_CAPSULE | Freq: Every day | ORAL | Status: DC
  Administered 2011-10-31: 100 mg via ORAL
  Filled 2011-10-31 (×2): qty 1

## 2011-10-31 NOTE — Progress Notes (Signed)
Therapeutic Recreation Discharge Summary Patient Details  Name: [name] MRN: [id number] Date of Birth: 1950-12-15  Long term goals set: 1  Long term goals met: 1  Comments on progress toward goals: pt has made good progress toward goal meeting supervision level for seated/standing TR tasks and community re-entry tasks.  Pt requires extra time and use of AE to complete tasks safely.  Pt d/c'd home with family.   Reasons for discharge: discharge from hospital  Patient/family agrees with progress made and goals achieved: Yes  [name] 10/31/2011, 5:03 PM

## 2011-10-31 NOTE — Progress Notes (Signed)
Inpatient Diabetes Program Recommendations  AACE/ADA: New Consensus Statement on Inpatient Glycemic Control (2009)  Target Ranges:  Prepandial:   less than 140 mg/dL      Peak postprandial:   less than 180 mg/dL (1-2 hours)      Critically ill patients:  140 - 180 mg/dL   Reason: elevated post-prandial CBGs >200  Inpatient Diabetes Program Recommendations Insulin - Basal: x Correction (SSI): x Insulin - Meal Coverage: Add Novolog 4 units with meals Outpatient Referral: order received  Previously on Novolog meal coverage with better control. Please consider restarting meal coverage.  Thanks Piedad Climes RN, BSN, CDE

## 2011-10-31 NOTE — Discharge Summary (Signed)
NAMEDENIKA, [name]                 ACCOUNT NO.:  [id number]  MEDICAL RECORD NO.:  [id number]  LOCATION:  4145                         FACILITY:  [facility]  PHYSICIAN:  Ranelle Oyster, M.D.DATE OF BIRTH:  Jun 21, 1951  DATE OF ADMISSION:  10/23/2011 DATE OF DISCHARGE:  11/01/2011                              DISCHARGE SUMMARY   DISCHARGE DIAGNOSES: 1. 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 2. Subcutaneous Lovenox for deep vein thrombosis prophylaxis. 3. Diabetes mellitus. 4. Hypertension. 5. Coronary artery disease with percutaneous transluminal coronary     angioplasty. 6. Chronic obstructive pulmonary disease. 7. Hyperlipidemia.  HISTORY:  A 60 year old right-handed female with history of coronary artery disease, congestive heart failure as well as uncontrolled diabetes mellitus, who was admitted to [facility] on December 8 with right-sided weakness and numbness as well as slurred speech.  MRI showed acute infarction, left lateral thalamus and internal capsule. There was also a subacute-appearing left inferior PCA territory infarction on the left occipital lobe.  MRA of the head with no major occlusion.  Carotid Doppler showed less than 50% stenosis, bilateral internal carotid arteries.  Echocardiogram with ejection fraction 55% and grade 2 diastolic dysfunction.  Neurology followup maintained on aspirin and Plavix therapy as prior to hospital admission for history of cardiac stents.  Subcutaneous Lovenox was initiated for deep vein thrombosis prophylaxis.  Noted hemoglobin A1c of 12.3 and Lantus insulin was adjusted.  She required minimal assist to moderate assist to ambulate with a rolling walker.  She was admitted for comprehensive rehab program.  PAST MEDICAL HISTORY:  See discharge diagnoses.  SOCIAL HISTORY:  Lives with her daughter.  Daughter provides assistance as needed.  The patient is on disability.  She was independent prior to admission  using a walker.  She still drives.  FUNCTIONAL HISTORY PRIOR TO ADMISSION:  Independent.  She was independent also with basic activities of daily living.  FUNCTIONAL STATUS UPON ADMISSION TO REHAB SERVICES:  Ambulating 4 feet with a rolling walker, minimal assist with limited endurance.  She required minimum to moderate assist for transfers.  ALLERGIES:  None.  PHYSICAL EXAMINATION:  VITAL SIGNS:  Blood pressure 136/68, pulse 92, respirations 18, temperature 98.3. GENERAL:  This was an alert female, oriented x3, appeared well- developed. HEENT:  Normocephalic. LUNGS:  Clear to auscultation. CARDIAC:  Regular rate and rhythm. ABDOMEN:  Soft, nontender.  Good bowel sounds. NEUROLOGIC:  She exhibited some mild dysarthria.  Decreased visual acuity, but no focal visual deficits.  Diminished fine motor skills of the right arm and right leg.  Right upper extremity was grossly graded 4/5.  Right lower extremity 3/5 proximal to 4/5 distally.  REHABILITATION HOSPITAL COURSE:  The patient was admitted to inpatient rehab services with therapies initiated on a 3-hour daily basis consisting of physical therapy, occupational therapy, and 24-hour rehabilitation nursing.  The following issues were addressed during the patient's rehabilitation stay.  Pertaining to [name]'s left lateral thalamus, internal capsule infarction remained stable, maintained on aspirin and Plavix therapy.  She continued on subcutaneous Lovenox for deep vein thrombosis prophylaxis throughout her rehab course with no bleeding episodes.  Noted uncontrolled diabetes mellitus with hemoglobin A1c  of 12.3.  Her Lantus insulin was adjusted.  Her blood sugars were normalizing.  She received full education in regards to her diabetes mellitus.  Blood pressures well-controlled on Norvasc, Lopressor, and low-dose Lasix.  She exhibited no signs of fluid overload or orthostatic changes and monitored closely with increased activity.   She would follow up with her primary care provider.  She did have a documented history of coronary artery disease with PTCA as noted above.  She continued on aspirin, Plavix therapy.  She had no chest pain or shortness of breath. Chronic obstructive pulmonary disease, maintained on Combivent inhaler. Oxygen saturations remained greater than 90% on room air.  The patient received weekly collaborative interdisciplinary team conferences to discuss estimated length of stay, family teaching, and any barriers to discharge.  She showed improved coordination and functional use of right- sided extremity, tolerating current diet.  She was minimal assist for activities of daily living, minimum to moderate assist mobility, supervision to communicate her needs, supervision for basic safety in cognition.  She was able to ambulate with a rolling walker with improved activity tolerance.  Full family teaching was completed and plan was to be discharged to home.  DISCHARGE MEDICATIONS: 1. Combivent inhaler 2 puffs every 3 hours as needed. 2. Norvasc 5 mg daily. 3. Ecotrin 325 mg daily. 4. Plavix 75 mg daily. 5. Lasix 40 mg daily. 6. Amaryl 4 mg daily. 7. Lantus insulin 75 units at bedtime. 8. Reglan 5 mg a.c. and at bedtime. 9. Lopressor 25 mg twice daily. 10.Nitrostat as needed. 11.Klor-Con 20 mEq daily. 12.Crestor 40 mg daily.  Her diet was a diabetic diet.  SPECIAL INSTRUCTIONS:  Home health therapies as directed per rehab Services.  The patient would follow up with Dr. Faith Rogue at the outpatient rehab service office as advised.  She should follow up with her primary MD within 2 weeks with appointment to be made.     [name], P.A.   ______________________________ Ranelle Oyster, M.D.    DA/MEDQ  D:  10/31/2011  T:  10/31/2011  Job:  [id number]

## 2011-10-31 NOTE — Progress Notes (Signed)
Physical Therapy Discharge Summary  Patient Details  Name: [name] MRN: [id number] Date of Birth: 17-Mar-1951 Today's Date: 10/31/2011  Patient has met 7/8 of long term goals due to increased strength, balance, gait, safety awareness, DME use and functional mobility. Patient to discharge at an ambulatory level Supervision.   Patient's daughter has completed family ed to provide the necessary physical and supervision assistance at discharge.  Recommendation:  Home health PT due to continued deficits in R LE Strength, balance and gait.  Equipment: Equipment provided: RW  Patient/family agrees with progress made and goals achieved: Yes.  PT Treatment Time: 0272-5366 Total Time: 33 minutes  Pt c/o 6/10 pain in RLE.  Repositioned pt and gave pt rest breaks as needed to control pain.  W/c mobility supervision 126ft to simulated car.  Car transfer sedan height supervision with rolling walker.  Gait ~35 ft to gym stairs simulating home entry.  Ascend/descend 4 stairs supervision with verbal cues for technique.  Obstacle course with new RW stepping over objects and navigating tight turns to simulate home environment in order to decrease risk of falls.  Christianne Dolin 10/31/2011, 4:02 PM

## 2011-10-31 NOTE — Progress Notes (Signed)
Physical Therapy Session Note  Patient Details  Name: [name] MRN: [id number] Date of Birth: 1951/05/04  Today's Date: 10/31/2011 Time: 4098-1191 Time Calculation (min): 56 min  Precautions: Precautions Precautions: Fall Required Braces or Orthoses: No Restrictions Weight Bearing Restrictions: No  Short Term Goals: PT Short Term Goal 1: Pt will perform functional transfers with min A PT Short Term Goal 2: pt will gait 100' in controlled environment with min A PT Short Term Goal 3: Pt will demonstrate dynamic balance during functional activity with min A  Pt/family education session to increase safe functional mobility and decrease risk of falls:  Discussed energy conversation techniques with family and what things to look out for when patient gets tired (narrow base of support, "catching" RLE on floor during swing phase, walker getting farther from the patient's body, scissoring during tight turns).  Guarding pt supervision during gait with RW on level floor as well as carpet with good return demonstration; verbal cues for family member to stay behind and to patient's right in case patient falls.  Gave verbal cues family can give to patient: "march your foot" when RLE catches on floor during swing phase; "widen your feet" when patient uses narrow base of support during gait or tight turns with RW.  Sedan height car transfer with RW supervision with good return demonstration; verbal cues to have family members remind patient to move seat all the way back when entering the vehicle and to lower the seat-back to allow more room to get in for patient, reminding the patient not to hang onto moving door for balance and reminding the patient to reach back during stand>sit and pushing off the chair/car/table when sit>stand.  Stairs supervision with good return demonstration; verbal cues to have family members remind patient to ascend stairs with LLE first followed by RLE in step-to pattern  while using bilateral UE support to simulate patient's home environment; verbal cues to have family members remind patient to descend stairs with RLE first followed by LLE in step-to pattern while using bilateral UE support to simulate patient's home environment; verbal cues for family members to have position behind patient on stairs for assist if needed.   Obstacle course with RW supervision stepping over obstacles and making tight turns; verbal cues given for technique (RLE first when stepping over obstacles; bring close to obstacle before stepping; move close to front of walker before stepping) to increase safe functional mobility and decrease risk of falls.  Patient consistently at supervision level with fatigue being the greatest limiting factor.  Fatigue magnifies patient's gait impairments with RW (decreased RLE clearance and narrow base of support during gait and tight turns).  Therapy/Group: Individual Therapy  Christianne Dolin 10/31/2011, 12:13 PM

## 2011-10-31 NOTE — Progress Notes (Signed)
Supervise walker ambulation. C/o right leg pain. See Mar for medication administration. Appetite good. Continent of bowel and bladder. Patient participating with therapies. Continue plan of care.

## 2011-10-31 NOTE — Progress Notes (Signed)
Occupational Therapy Discharge Summary  Patient Details  Name: [name] MRN: [id number] Date of Birth: 09-21-1951 Today's Date: 10/31/2011  Patient has met 9 of 9 long term goals due to improved activity tolerance, improved balance, postural control, functional use of  RIGHT upper and RIGHT lower extremity, improved attention, improved awareness and improved coordination.  Patient's care partner is independent to provide the necessary physical and cognitive assistance at discharge.    Recommendation:  Patient will continue to received OT services in a home health setting to continue to advance functional skills in the area of ADL.  Equipment: Equipment provided: drop arm commode, tub bench  Patient/family agrees with progress made and goals achieved: Yes  Occupational Therapy Session Note  Patient Details  Name: [name] MRN: [id number] Date of Birth: Jul 22, 1951  Today's Date: 10/31/2011 Time:900-945 Time Calculation (min): 45 min  Precautions: Precautions Precautions: Fall Required Braces or Orthoses: No Restrictions Weight Bearing Restrictions: No  Short Term Goals: OT Short Term Goal 1: Pt would transfer into tub shower with tub bench with min A  MET OT Short Term Goal 2: Pt would perform toileting with min A MET OT Short Term Goal 3: Pt would perform toilet transfer with supervision with LRAD MET OT Short Term Goal 4: Pt would dress LB with min A MET OT Short Term Goal 5: Pt would able to fix her hair with supervision to show improvement in right UE MET  Skilled Therapeutic Interventions/Progress Updates: Grad Day: Family education with pt's daughter focusing on functional ambulation with RW, safe use of RW, tub bench and BSC, standing balance with and without UE support, how to provide the proper types of cuing, transfers, dressing with modified techniques (used prn), home f/u therapy.      Pain Pain Assessment Pain Assessment: No/denies  pain   ADL ADL Eating: Modified independent Where Assessed-Eating: Chair Grooming: Modified independent Where Assessed-Grooming: Sitting at sink Upper Body Bathing: Supervision/safety Where Assessed-Upper Body Bathing: Shower Lower Body Bathing: Supervision/safety Where Assessed-Lower Body Bathing: Shower Upper Body Dressing: Supervision/safety Where Assessed-Upper Body Dressing: Chair Lower Body Dressing: Supervision/safety Where Assessed-Lower Body Dressing: Standing at sink;Sitting at sink Toileting: Supervision/safety Toilet Transfer: Close supervision Toilet Transfer Method: Ambulating Tub/Shower Transfer: Close supervison       Other Treatments  Pm session 1300-1330 1:1 Focus on [facility] with small familiar objects (buttons, paper clips, pom-poms, safety pins, coins, beads of different sizes, string) for pincher grasp, in hand manipulation, isolated finger movement, practicing using a spoon to scoop small beads from bowl into another bowl to promote wrist, hand - coordination. Provided at home activities to do to encourage increased [facility].   Therapy/Group: Individual Therapy  Melonie Florida 10/31/2011, 3:42 PM   Melonie Florida 10/31/2011, 3:40 PM

## 2011-10-31 NOTE — Progress Notes (Signed)
Per State Regulation 482.30 This chart was reviewed for medical necessity with respect to the patient's Admission/Duration of stay. Pt's daughter in for family ed this am.  Pt on target for d/c home tomorrow-supervision-min assist.  Needs more help when tired.  Needs reinforcement on DM & BP management.  Plan [facility] RN & therapy f/up.   Brock Ra                 Nurse Care Manager              Next Review Date: none

## 2011-10-31 NOTE — Progress Notes (Signed)
Speech Language Pathology Therapy Note & Discharge Summary  Patient Details  Name: [name] MRN: [id number] Date of Birth: October 19, 1951  Today's Date: 10/31/2011 Time: 4098-1191 Time Calculation (min): 30 min  Precautions: Precautions Precautions: Fall Required Braces or Orthoses: No Restrictions Weight Bearing Restrictions: No  Skilled Therapeutic Interventions: Treatment focus on family education with daughter and son-in law in regards to current cognitive function and functional communication. Pt's family given pill box to [place] at home. Pt and family verbalized understanding of all information presented and reported no questions. Pt will d/c home tomorrow with 24/7 supervision from family.   Pain: No/Denies Pain   Oral/Motor: Oral Motor/Sensory Function Overall Oral Motor/Sensory Function: Appears within functional limits for tasks assessed Motor Speech Intelligibility: Intelligibility reduced Comprehension: Auditory Comprehension Overall Auditory Comprehension: Appears within functional limits for tasks assessed Yes/No Questions: Within Functional Limits Commands: Impaired Multistep Basic Commands: 50-74% accurate Conversation: Simple Visual Recognition/Discrimination Discrimination: Within Function Limits Reading Comprehension Reading Status: Within funtional limits Expression: Expression Primary Mode of Expression: Verbal Verbal Expression Overall Verbal Expression: Impaired Initiation: No impairment Level of Generative/Spontaneous Verbalization: Conversation Repetition: No impairment Naming: No impairment Pragmatics: No impairment Interfering Components: Speech intelligibility;Other (comment) (mild dysarthria) Other Verbal Expression Comments: Pt with improved word-finding and improved speech intellgibility.  Written Expression Dominant Hand: Right Written Expression: Within Functional Limits  Therapy/Group: Individual Therapy  Jakoby Melendrez,  Shalicia Craghead 10/31/2011 11:53 AM  Speech Language Pathology Discharge Summary   Long term goals set: 2  Long term goals met: 2  Comments on progress toward goals: Pt has made great progress and has met all LTG's this reporting period. Currently, pt is overall Mod I for complex problem solving, anticipatory awareness, and divided attention. Pt needs supervision verbal cues to utilize external memory aids and compensatory strategies of writing information down to increase recall of new information.  Pt continued to demonstrate mild dysarthria but is 100% intelligible and is Mod I for utilizing intelligibility strategies of slow pace. Pt/family education complete and pt will d/c home with 24/7 supervision. Pt would benefit from skilled SLP f/u to maximize cognitive function and overall independence.   Reasons goals not met: N/A  Equipment acquired: N/A  Reasons for discharge: treatment goals met and discharge from hospital  Follow-up: Home Health  Patient/family agrees with progress made and goals achieved: Yes  Bryan Goin 10/31/2011

## 2011-10-31 NOTE — Progress Notes (Signed)
Subjective/Complaints:  Review of Systems  Constitutional: Negative.   HENT: Negative.   Eyes: Negative.   Respiratory: Negative.   Cardiovascular: Negative.   Gastrointestinal: Negative.   Genitourinary: Positive for urgency and frequency.  Musculoskeletal: Negative.   Skin: Negative.    Neurontin made her drowsy but now after stopping toes hurt on left foot Objective: Vital Signs: Blood pressure 116/65, pulse 94, temperature 98.4 F (36.9 C), temperature source Oral, resp. rate 17, height 5\' 3"  (1.6 m), weight 100.1 kg (220 lb 10.9 oz), SpO2 98.00%. No results found. No results found for this basename: WBC:2,HGB:2,HCT:2,PLT:2 in the last 72 hours No results found for this basename: NA:2,K:2,CL:2,CO2:2,GLUCOSE:2,BUN:2,CREATININE:2,CALCIUM:2 in the last 72 hours CBG (last 3)   Basename 10/31/11 0708 10/30/11 2033 10/30/11 1633  GLUCAP 148* 217* 202*    Wt Readings from Last 3 Encounters:  10/23/11 100.1 kg (220 lb 10.9 oz)  10/22/11 98.4 kg (216 lb 14.9 oz)  09/02/11 98.34 kg (216 lb 12.8 oz)    Physical Exam:  General appearance: alert, cooperative and no distress Head: Normocephalic, without obvious abnormality, atraumatic Eyes: conjunctivae/corneas clear. PERRL, EOM's intact. Fundi benign. Ears: normal TM's and external ear canals both ears Nose: Nares normal. Septum midline. Mucosa normal. No drainage or sinus tenderness. Throat: lips, mucosa, and tongue normal; teeth and gums normal Neck: no adenopathy, no carotid bruit, no JVD, supple, symmetrical, trachea midline and thyroid not enlarged, symmetric, no tenderness/mass/nodules Back: symmetric, no curvature. ROM normal. No CVA tenderness. Resp: clear to auscultation bilaterally Cardio: regular rate and rhythm, S1, S2 normal, no murmur, click, rub or gallop GI: soft, non-tender; bowel sounds normal; no masses,  no organomegaly Extremities: extremities normal, atraumatic, no cyanosis or edema bilateral foot intrinsic  atrophy Pulses: 2+ and symmetric Skin: Skin color, texture, turgor normal. No rashes or lesions feet warma dn dry no lesions Neurologic: She is alert and oriented to person, place, and time.  Mild dysarthria. Decreased visual acuity but no focal visual deficits. No nystagmus is seen. She has diminished fine motor coordination of right arm and right leg but is showing improvement. Decreased pronator drift is seen on the right. Sensory exam is 1+ out of 2 right arm and right leg. Strength right upper extremity is grossly 4/5 proximal distal. Right lower extremity strength is 3/5 proximal to 4/5 distally. Left upper lower extremity strength are 5 out of 5 grossly. Cognitively patient is alert and appropriate with good insight and awareness.   Assessment/Plan: 1. 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 Physiatrist is providing close team supervision and 24 hour management of active medical problems listed below. Physiatrist and rehab team continue to assess barriers to discharge/monitor patient progress toward functional and medical goals.  Encouraged her to engage the RUE and RLE as much as possible. Mobility: Bed Mobility Bed Mobility: No Supine to Sit: 5: Supervision Transfers Transfers: Yes Sit to Stand: 5: Supervision;With upper extremity assist;With armrests Sit to Stand Details (indicate cue type and reason): verbal cues to keep walker close and hand placement Stand to Sit: 5: Supervision;Without upper extremity assist;With armrests Stand to Sit Details: verbal cues for hand placement Stand Pivot Transfers: 4: Min assist;Other (comment) (with RW) Stand Pivot Transfer Details (indicate cue type and reason): verbal cues for hand placement Ambulation/Gait Ambulation/Gait Assistance: 5:  Supervision Ambulation/Gait Assistance Details (indicate cue type and reason): verbal cues  to lift up R foot and have wider base of support Ambulation Distance (Feet): 166 Feet Assistive device: Rolling walker Gait Pattern: Step-through pattern;Decreased step length - right;Decreased step length - left;Trunk flexed;Shuffle Stairs: No Stairs Assistance: 3: Mod assist Stairs Assistance Details (indicate cue type and reason): Up and down 5 stairs with L rail and bilat UE support with cues for safety and technique Stair Management Technique: One rail Left;Step to pattern Number of Stairs: 5  Height of Stairs: 6  Wheelchair Mobility Wheelchair Mobility: No Wheelchair Assistance: 5: Investment banker, operational: Both upper extremities Wheelchair Parts Management: Needs assistance (leg rest management) Distance: 150 ADL:   Cognition: Cognition Overall Cognitive Status: Impaired Arousal/Alertness: Awake/alert Orientation Level: Oriented X4 Attention: Selective Selective Attention: Appears intact Memory: Appears intact Awareness: Appears intact Problem Solving: Appears intact (called nurse to ask question about medications) Safety/Judgment: Impaired Comments: impulsive when fatigued Cognition Arousal/Alertness: Awake/alert Orientation Level: Oriented X4   2. DVT Prophylaxis/Anticoagulation: Subcutaneous Lovenox as well as support hose. Monitor for any signs of deep vein thrombosis. Monitor platelet counts closely and any bleeding episodes.   3. Uncontrolled diabetes mellitus-hemoglobin A1c of 12.3.  Lantus  75 units qhs.  full diabetic education. It sounds as if sugars were poorly controlled before. Will increase amaryl and stop mealtime novolog to simpify home regimen. Will need further outpt titration  4. Hypertension-Norvasc 5 mg daily/Lopressor 25 mg twice daily/Lasix 40 mg daily. Improved BP control. Secondary education regarding blood pressure control.   -supplement  potassium  5. Coronary artery disease with PTCA-continue aspirin therapy as well as Plavix. Patient no current complaints of chest pain and monitor  therapy progresses.   6. COPD/OSA. Continue Combivent inhaler. Doing well  7. Hyperlipidemia-Crestor   8. Bladder: Patient with baseline incontinence. Place patient on timed voiding schedule. Check PVRs. Catheterize as needed. White the anatomic and neurological components to her incontinence.  9. Bilateral foot pain mainly at night probable diabetic neuropathy.  Retrial neurontin at 100mg  qhs. Discussed with patient      LOS (Days) 8 A FACE TO FACE EVALUATION WAS PERFORMED  Asa Fath T 10/31/2011, 9:22 AM

## 2011-11-01 DIAGNOSIS — E1165 Type 2 diabetes mellitus with hyperglycemia: Secondary | ICD-10-CM

## 2011-11-01 DIAGNOSIS — Z5189 Encounter for other specified aftercare: Secondary | ICD-10-CM

## 2011-11-01 DIAGNOSIS — I6330 Cerebral infarction due to thrombosis of unspecified cerebral artery: Secondary | ICD-10-CM

## 2011-11-01 DIAGNOSIS — I10 Essential (primary) hypertension: Secondary | ICD-10-CM

## 2011-11-01 DIAGNOSIS — M79609 Pain in unspecified limb: Secondary | ICD-10-CM

## 2011-11-01 LAB — GLUCOSE, CAPILLARY: Glucose-Capillary: 95 mg/dL (ref 70–99)

## 2011-11-01 MED ORDER — GABAPENTIN 100 MG PO CAPS: 100.0000 mg | ORAL_CAPSULE | Freq: Every day | ORAL | Status: DC

## 2011-11-01 NOTE — Progress Notes (Signed)
Pt dc home. Escorted to private vehicle via wc. Daughter at side.

## 2011-11-01 NOTE — Progress Notes (Signed)
Social Work  Discharge Note  The overall goal for the admission was met for:   Discharge location: Yes - home with family  Length of Stay: Yes - 9 days  Discharge activity level: Yes - supervision to mod independent  Home/community participation: Yes  Services provided included: MD, RD, PT, OT, SLP, RN, CM, TR, Pharmacy and SW  Financial Services: Medicare  Follow-up services arranged: Home Health: RN, PT, OT, ST via [facility], DME: rolling walker, droparm commode and tub bench via [facility] and Patient/Family has no preference for HH/DME agencies  Comments (or additional information): - provided pt and family with info on stroke support group  Patient/Family verbalized understanding of follow-up arrangements: Yes  Individual responsible for coordination of the follow-up plan: patient  Confirmed correct DME delivered: Gean Larose 11/01/2011    Karen Huhta

## 2011-11-01 NOTE — Progress Notes (Signed)
Patient ID: [name], female   DOB: 05-Jun-1951, 60 y.o.   MRN: [id number] Subjective/Complaints: R calf pain new since yesterday, no fall or trauma.  No SOB Review of Systems  Constitutional: Negative.   HENT: Negative.   Eyes: Negative.   Respiratory: Negative.   Cardiovascular: Negative.   Gastrointestinal: Negative.   Genitourinary: Positive for urgency and frequency.  Musculoskeletal: Negative.   Skin: Negative.    Neurontin made her drowsy but now after stopping toes hurt on left foot Objective: Vital Signs: Blood pressure 121/67, pulse 84, temperature 98.1 F (36.7 C), temperature source Oral, resp. rate 20, height 5\' 3"  (1.6 m), weight 100.1 kg (220 lb 10.9 oz), SpO2 97.00%. No results found. No results found for this basename: WBC:2,HGB:2,HCT:2,PLT:2 in the last 72 hours No results found for this basename: NA:2,K:2,CL:2,CO2:2,GLUCOSE:2,BUN:2,CREATININE:2,CALCIUM:2 in the last 72 hours CBG (last 3)   Basename 11/01/11 0703 10/31/11 2033 10/31/11 1610  GLUCAP 95 166* 128*    Wt Readings from Last 3 Encounters:  10/23/11 100.1 kg (220 lb 10.9 oz)  10/22/11 98.4 kg (216 lb 14.9 oz)  09/02/11 98.34 kg (216 lb 12.8 oz)    Physical Exam:  General appearance: alert, cooperative and no distress Head: Normocephalic, without obvious abnormality, atraumatic Eyes: conjunctivae/corneas clear. PERRL, EOM's intact. Fundi benign. Ears: normal TM's and external ear canals both ears Nose: Nares normal. Septum midline. Mucosa normal. No drainage or sinus tenderness. Throat: lips, mucosa, and tongue normal; teeth and gums normal Neck: no adenopathy, no carotid bruit, no JVD, supple, symmetrical, trachea midline and thyroid not enlarged, symmetric, no tenderness/mass/nodules Back: symmetric, no curvature. ROM normal. No CVA tenderness. Resp: clear to auscultation bilaterally Cardio: regular rate and rhythm, S1, S2 normal, no murmur, click, rub or gallop GI: soft, non-tender; bowel  sounds normal; no masses,  no organomegaly Extremities: extremities normal, atraumatic, no cyanosis or edema bilateral foot intrinsic atrophy has 1 + pitting RLE trace on left, R lower calf tenderness to palpation no cords palpated, foot is warm and pink but diminished pulses Pulses: 2+ and symmetric Skin: Skin color, texture, turgor normal. No rashes or lesions feet warma dn dry no lesions Neurologic: She is alert and oriented to person, place, and time.  Mild dysarthria. Decreased visual acuity but no focal visual deficits. No nystagmus is seen. She has diminished fine motor coordination of right arm and right leg but is showing improvement. Decreased pronator drift is seen on the right. Sensory exam is 1+ out of 2 right arm and right leg. Strength right upper extremity is grossly 4/5 proximal distal. Right lower extremity strength is 3/5 proximal to 4/5 distally. Left upper lower extremity strength are 5 out of 5 grossly. Cognitively patient is alert and appropriate with good insight and awareness.   Assessment/Plan: 1. 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  May d/c home today if doppler of RLE is negative for acute DVT Bed Mobility Bed Mobility: No Supine to Sit: 5: Supervision Transfers Transfers: Yes Sit to Stand: 5: Supervision;With upper extremity assist;With armrests Sit to Stand Details (indicate cue type and reason): verbal cues to keep walker close and hand placement Stand to Sit: 5: Supervision;With upper extremity assist;With armrests Stand to Sit Details: verbal cues for hand placement Stand Pivot Transfers: 5: Supervision Stand Pivot Transfer Details (indicate cue type and reason): verbal cues for hand placement Ambulation/Gait Ambulation/Gait Assistance: 5: Supervision Ambulation/Gait Assistance Details (indicate cue type and reason): verbal cues to lift up  R foot and have wider base of support Ambulation Distance  (Feet): 35 Feet Assistive device: Rolling walker Gait Pattern: Step-through pattern;Decreased step length - left;Decreased stance time - right;Trunk flexed;Shuffle Gait velocity: decreased RLE clearance during swing phase Stairs: Yes Stairs Assistance: 5: Supervision Stairs Assistance Details (indicate cue type and reason): Up and down 5 stairs with L rail and bilat UE support with cues for safety and technique Stair Management Technique: Two rails;Step to pattern;Forwards Number of Stairs: 4  Height of Stairs: 6  Corporate treasurer: Yes Wheelchair Assistance: 5: Investment banker, operational: Both upper extremities;Both lower extermities Wheelchair Parts Management: Needs assistance (leg rest management) Distance: >132ft ADL:   Cognition: Cognition Overall Cognitive Status: Impaired Arousal/Alertness: Awake/alert Orientation Level: Oriented X4 Attention: Divided Selective Attention: Appears intact Divided Attention: Appears intact Memory: Impaired Memory Impairment: Decreased recall of new information Decreased Short Term Memory: Verbal complex;Functional complex Awareness: Appears intact Problem Solving: Impaired Problem Solving Impairment: Functional complex Safety/Judgment: Appears intact Comments: impulsive when fatigued Cognition Arousal/Alertness: Awake/alert Orientation Level: Oriented X4   2. DVT Prophylaxis/Anticoagulation: Subcutaneous Lovenox as well as support hose. Monitor for any signs of deep vein thrombosis. Monitor platelet counts closely and any bleeding episodes.   R calf pain will check doppler to r/o DVT  3. Uncontrolled diabetes mellitus-hemoglobin A1c of 12.3.  Lantus  75 units qhs.  full diabetic education. It sounds as if sugars were poorly controlled before. Will increase amaryl and stop mealtime novolog to simpify home regimen. Will need further outpt titration  4. Hypertension-Norvasc 5 mg daily/Lopressor 25 mg twice  daily/Lasix 40 mg daily. Improved BP control. Secondary education regarding blood pressure control.   -supplement potassium  5. Coronary artery disease with PTCA-continue aspirin therapy as well as Plavix. Patient no current complaints of chest pain and monitor  therapy progresses.   6. COPD/OSA. Continue Combivent inhaler. Doing well  7. Hyperlipidemia-Crestor   8. Bladder: Patient with baseline incontinence. Place patient on timed voiding schedule. Check PVRs. Catheterize as needed. White the anatomic and neurological components to her incontinence.  9. Bilateral foot pain mainly at night probable diabetic neuropathy.  Retrial neurontin at 100mg  qhs. Discussed with patient      LOS (Days) 9 A FACE TO FACE EVALUATION WAS PERFORMED  KIRSTEINS,ANDREW E 11/01/2011, 10:02 AM

## 2011-11-01 NOTE — Progress Notes (Signed)
*  PRELIMINARY RESULTS*  RLEV Duplex has been performed.  No obvious evidence of deep vein thrombosis involving the right lower extremity. No obvious evidence of a Baker's Cyst on the right.  Mila Homer 11/01/2011, 12:57 PM

## 2011-12-04 ENCOUNTER — Encounter: Payer: Self-pay | Admitting: Vascular Surgery

## 2011-12-04 ENCOUNTER — Encounter: Payer: Medicare Other | Attending: Physical Medicine & Rehabilitation | Admitting: Physical Medicine & Rehabilitation

## 2011-12-04 DIAGNOSIS — I2510 Atherosclerotic heart disease of native coronary artery without angina pectoris: Secondary | ICD-10-CM | POA: Insufficient documentation

## 2011-12-04 DIAGNOSIS — M545 Low back pain, unspecified: Secondary | ICD-10-CM | POA: Insufficient documentation

## 2011-12-04 DIAGNOSIS — I69998 Other sequelae following unspecified cerebrovascular disease: Secondary | ICD-10-CM | POA: Insufficient documentation

## 2011-12-04 DIAGNOSIS — I6330 Cerebral infarction due to thrombosis of unspecified cerebral artery: Secondary | ICD-10-CM

## 2011-12-04 DIAGNOSIS — IMO0002 Reserved for concepts with insufficient information to code with codable children: Secondary | ICD-10-CM

## 2011-12-04 DIAGNOSIS — G8929 Other chronic pain: Secondary | ICD-10-CM | POA: Insufficient documentation

## 2011-12-04 DIAGNOSIS — E1142 Type 2 diabetes mellitus with diabetic polyneuropathy: Secondary | ICD-10-CM | POA: Insufficient documentation

## 2011-12-04 DIAGNOSIS — E1149 Type 2 diabetes mellitus with other diabetic neurological complication: Secondary | ICD-10-CM | POA: Insufficient documentation

## 2011-12-04 NOTE — Assessment & Plan Note (Signed)
Ms. Joffe is back regarding her left thalamic and internal capsule infarcts.  She has been home receiving home health until she is up in a week or 2.  She is walking with a walker around the house and does most of her bathing and dressing by herself, but daughter does help with few of the difficult tasks.  She is walking with a waist belt in the house with therapies.  She has not gotten out of the house very much.  She complains of lot of tingling and burning in both legs.  She is only tolerating 100 mg of the Neurontin at night and generally feels a bit groggy from it in the morning.  It does help her sleep a bit better. She is having a lot of low back pain as well as radiation into the left leg at times and foot.  Pain today is an 8/10.  Sleep is rated as poor.  REVIEW OF SYSTEMS:  Notable for the above.  Full 12-point review is in the written health and history section of the chart.  She does report improved control of her sugars, but still as had some in the 200s.  SOCIAL HISTORY:  The patient is living with her daughter.  They live in a mobile home currently.  PHYSICAL EXAMINATION:  VITAL SIGNS:  Blood pressure is 138/47, pulse is 75, respiratory rate is 16 and she is satting 98% on room air. GENERAL:  The patient is pleasant, generally alert.  She is in her wheelchair today. EXTREMITIES:  She has diminished fine touch in a stocking gloves distribution over both legs below the knees.  She has some hypersensitivity in fact light touch today with fine point discrimination is decreased.  She has 4/5 strength in the left ankle dorsiflexors, plantar flexors, knee flexors and hip flexors.  Her right lower extremity notable for 2/5 strength with hip flexion, 2+ with knee extension, 2+ to 3+ with ankle dorsiflexion, plantar flexion.  Right upper extremity strength grossly 4/5 proximal to distal and 4+-5/5 on the left.  She does have a mild right pronator drift.  She has  some diminishment of fine touch in the fingertips and palm.  She remains morbidly overweight. NEUROLOGIC:  Cognitively, she is alert and appropriate. HEART:  Regular. CHEST:  Clear. ABDOMEN:  Soft and nontender. BACK:  On examination of the low back, she had tenderness over the L4-L5 level.  It seems to be a bit worse with extension and flexion, but this is difficult to discern as I did not stand her up today.  We did straight leg raising/seated slump testing and she more or less had a hamstring tightness above anything else.  ASSESSMENT: 1. 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 2. Diabetes mellitus with peripheral neuropathy. 3. History of coronary artery disease. 4. Chronic low back pain with questionable radiculopathy.  PLAN: 1. I would like to transition the patient to outpatient physical     therapy to further work on her gait as well as posture positioning,     core muscle strengthening, education and modalities.  She should     probably continue using a rolling walker for the imminent future. 2. We will discontinue Neurontin and begin trial of Keppra 250 mg at     bedtime.  She may increase to twice a day after 1 week if     tolerated. 3. Gave the patient a prescription for insulin pens to assist her with     administration of her  insulin at home.  We discussed the fact that     she needs to have these tighter control of her sugars to modify     stroke risk factors and     other affects of diabetes. 4. I will see the patient back here in about 6 weeks' time.  She will     call me with any problems or questions.     Ranelle Oyster, M.D. Electronically Signed    ZTS/MedQ D:  12/04/2011 11:14:17  T:  12/04/2011 18:10:05  Job #:  [id number]

## 2011-12-05 ENCOUNTER — Encounter: Payer: Self-pay | Admitting: Vascular Surgery

## 2011-12-05 ENCOUNTER — Ambulatory Visit (INDEPENDENT_AMBULATORY_CARE_PROVIDER_SITE_OTHER): Payer: Medicare Other | Admitting: Vascular Surgery

## 2011-12-05 ENCOUNTER — Encounter (INDEPENDENT_AMBULATORY_CARE_PROVIDER_SITE_OTHER): Payer: Medicare Other | Admitting: *Deleted

## 2011-12-05 VITALS — BP 147/82 | HR 79 | Resp 16 | Ht 63.0 in | Wt 224.0 lb

## 2011-12-05 DIAGNOSIS — I739 Peripheral vascular disease, unspecified: Secondary | ICD-10-CM

## 2011-12-05 DIAGNOSIS — Z48812 Encounter for surgical aftercare following surgery on the circulatory system: Secondary | ICD-10-CM

## 2011-12-05 NOTE — Progress Notes (Signed)
[facility] [place] HISTORY AND PHYSICAL   History of Present Illness:  Patient is a 61 y.o. year old female who presents for follow-up evaluation for PAD.  She is on Plavix and Aspirin for antiplatelet therapy. Her atherosclerotic risk factors remain diabetes, elevated cholesterol, hypertension, smoking (quit 1 yr ago), and coronary artery disease.  She also had a left brain stroke in December of this year which affected the right side of her body. She is still currently in rehabilitation working on strengthening her right leg. Her medical problems otherwise are all currently stable and followed by the primary care physician.  During the course of her stroke workup she had a carotid duplex exam which showed no significant carotid stenosisThe patient denies rest pain or ulcers on the feet. However her history is difficult because she does have severe neuropathy in both lower extremities.  Past Medical History  Diagnosis Date  . GERD (gastroesophageal reflux disease)   . PAD (peripheral artery disease)     Stent, embolectomy, surgery  /     Dr. Darrick Penna, ABI 0.52 right, 0.54 left, February, 2010  . COPD (chronic obstructive pulmonary disease)     Dr. Craige Cotta, medications, PFTs August, 2012  . OSA (obstructive sleep apnea)     Dr. Craige Cotta  CPAP started August, 2012  . Systolic and diastolic CHF, chronic   . CAD (coronary artery disease)     June, 2012, DES to RCA, staged DES to circumflex and DES to diagonal with pressure wire to LAD, FFR 0.84  . DM (diabetes mellitus), type 2   . Hypertension   . Morbid obesity   . Hyperlipidemia   . Ejection fraction < 50%     EF 45-50%, echo, June, 2012  . Diastolic dysfunction     grade 2, echo, June, 2012  . Peripheral neuropathy   . Tobacco abuse   . Syncope     vasovagal  . Breast cancer     lumpectomy and radiation 2002  . MI, old   . Myocardial infarction 10/19/11  . DVT (deep venous thrombosis)     Past Surgical History    Procedure Date  . Appendectomy   . Cholecystectomy   . Tubal ligation   . Breast surgery     Left breast cancer  . Cardiac catheterization 2002  . Popliteal artery angioplasty 11/30/10    Left leg w/ embolectomy, endarterectomy  by Dr.Neela Zecca  . Coronary angioplasty with stent placement   . Pr vein bypass graft,aorto-fem-pop     Review of Systems:  Neurologic: sensation in the feet is decreased Cardiac:denies shortness of breath or chest pain Pulmonary: denies cough or wheeze  Social History History  Substance Use Topics  . Smoking status: Former Smoker -- 1.5 packs/day for 44 years    Types: Cigarettes    Quit date: 11/30/2010  . Smokeless tobacco: Never Used  . Alcohol Use: No    Allergies  No Known Allergies   Current Outpatient Prescriptions  Medication Sig Dispense Refill  . albuterol-ipratropium (COMBIVENT) 18-103 MCG/ACT inhaler Inhale 2 puffs into the lungs every 3 (three) hours as needed. For shortness of breath.      Marland Kitchen aspirin 81 MG tablet Take 81 mg by mouth daily.        . clopidogrel (PLAVIX) 75 MG tablet Take 75 mg by mouth daily.        . furosemide (LASIX) 40 MG tablet Take 40 mg by mouth daily.        Marland Kitchen  gabapentin (NEURONTIN) 100 MG capsule Take 1 capsule (100 mg total) by mouth at bedtime.  30 capsule  1  . glimepiride (AMARYL) 4 MG tablet Take 1 tablet (4 mg total) by mouth daily with breakfast.  30 tablet  1  . insulin glargine (LANTUS) 100 UNIT/ML injection Inject 75 Units into the skin at bedtime.  10 mL  1  . insulin regular (NOVOLIN R,HUMULIN R) 100 units/mL injection Inject into the skin. 4 times a day prn per sliding scale      . metoCLOPramide (REGLAN) 5 MG tablet Take 5 mg by mouth 4 (four) times daily.        . metoprolol tartrate (LOPRESSOR) 25 MG tablet Take 25 mg by mouth 2 (two) times daily.        . nitroGLYCERIN (NITROSTAT) 0.4 MG SL tablet Place 1 tablet (0.4 mg total) under the tongue every 5 (five) minutes as needed for  chest pain.  25 tablet  3  . potassium chloride SA (K-DUR,KLOR-CON) 20 MEQ tablet Take 1 tablet (20 mEq total) by mouth daily at 12 noon.  30 tablet  1  . rosuvastatin (CRESTOR) 40 MG tablet Take 40 mg by mouth daily.        Marland Kitchen amLODipine (NORVASC) 5 MG tablet Take 5 mg by mouth daily.       . clindamycin (CLEOCIN) 150 MG capsule Take 150 mg by mouth daily as needed. When patient feels that cellulitis in foot is going to flare up.       . levETIRAcetam (KEPPRA) 250 MG tablet Take 250 mg by mouth every 12 (twelve) hours.      . ondansetron (ZOFRAN) 4 MG tablet Take 4 mg by mouth every 8 (eight) hours as needed. Nausea       . pantoprazole (PROTONIX) 40 MG tablet Take 40 mg by mouth daily.          Physical Examination  Filed Vitals:   12/05/11 1118  BP: 147/82  Pulse: 79  Resp: 16  Height: 5\' 3"  (1.6 m)  Weight: 224 lb (101.606 kg)  SpO2: 99%    Body mass index is 39.68 kg/(m^2).  General:  Alert and oriented, no acute distress HEENT: Normal Neck: No bruit or JVD Pulmonary: Clear to auscultation bilaterally Cardiac: Regular Rate and Rhythm without murmur Neurologic: Upper and lower extremity motor 5/5 and symmetric Extremities:  2+ femoral absent popliteal and pedal pulses Skin: no ulcer or rash  DATA: She had bilateral ABIs performed today which are reviewed and interpreted. ABI on the right side was 0.37 left was 0.47 this represents approximately 10% decline bilaterally from July of 2012.   ASSESSMENT: Worsening peripheral arterial disease bilateral lower extremities. Her perfusion is marginal in both feet. However she does not have an open wound or gangrenous change currently. In light of the fact she just had a stroke in December a not very and is asked about chasing her legs at this point. I believe the best option would be to have her return in 3 months time for repeat ABIs and arterial duplex. At that time she is not having any further stroke symptoms and has recovered  somewhat and we will consider arteriogram.   PLAN: Followup 3 months with ABIs and arterial duplex. She'll return sooner if she develops nonhealing worsening ulcers on her feet  Fabienne Bruns, MD [facility] [place] Office: [phone] Pager: [phone]

## 2011-12-12 NOTE — Procedures (Unsigned)
BYPASS GRAFT EVALUATION  INDICATION:  Follow up left lower extremity stents.  HISTORY: Diabetes:  Yes. Cardiac:  No. Hypertension:  Yes. Smoking:  Yes. Previous Surgery:  Left superficial femoral PTA/stenting, left popliteal and tibioperoneal trunk endarterectomy and PTA 11/30/2010.  SINGLE LEVEL ARTERIAL EXAM                              RIGHT              LEFT Brachial:                    167                160 Anterior tibial:             61                 78 Posterior tibial:            50                 75 Peroneal: Ankle/brachial index:        0.37               0.47  PREVIOUS ABI:  Date:  RIGHT:  LEFT:  LOWER EXTREMITY BYPASS GRAFT DUPLEX EXAM:  DUPLEX:  Biphasic/monophasic Doppler waveforms noted throughout the left femoral to popliteal arterial system with a velocity of 468 cm/s noted in the left proximal SFA.  IMPRESSION: 1. Patent left superficial femoral, popliteal, and posterior tibial     trunk revascularization sites with increased velocity noted, as     described above. 2. The right ankle brachial indices are suggestive of critical     arterial disease. 3. The left ankle brachial indices are suggestive of severe arterial     disease. 4. Ankle brachial indices have declined bilaterally in comparison to     the exam performed 05/30/2011.  ___________________________________________ [name]. Fields, MD  EM/MEDQ  D:  12/05/2011  T:  12/05/2011  Job:  [id number]

## 2012-01-15 ENCOUNTER — Encounter: Payer: Medicare Other | Admitting: Physical Medicine & Rehabilitation

## 2012-02-10 ENCOUNTER — Encounter: Payer: Self-pay | Admitting: *Deleted

## 2012-02-10 NOTE — Progress Notes (Signed)
Fax request for potassium deferred to PCP or cardiology. Pharmacy notified by return fax

## 2012-02-18 ENCOUNTER — Encounter: Payer: Medicare Other | Admitting: Physical Medicine & Rehabilitation

## 2012-02-20 ENCOUNTER — Other Ambulatory Visit: Payer: Self-pay | Admitting: *Deleted

## 2012-03-04 ENCOUNTER — Encounter: Payer: Self-pay | Admitting: Vascular Surgery

## 2012-03-05 ENCOUNTER — Ambulatory Visit (INDEPENDENT_AMBULATORY_CARE_PROVIDER_SITE_OTHER): Payer: Medicare Other | Admitting: *Deleted

## 2012-03-05 ENCOUNTER — Ambulatory Visit (INDEPENDENT_AMBULATORY_CARE_PROVIDER_SITE_OTHER): Payer: Medicare Other | Admitting: Vascular Surgery

## 2012-03-05 ENCOUNTER — Encounter (INDEPENDENT_AMBULATORY_CARE_PROVIDER_SITE_OTHER): Payer: Medicare Other | Admitting: *Deleted

## 2012-03-05 ENCOUNTER — Encounter: Payer: Self-pay | Admitting: Vascular Surgery

## 2012-03-05 VITALS — BP 156/66 | HR 102 | Resp 18 | Ht 63.0 in | Wt 224.0 lb

## 2012-03-05 DIAGNOSIS — I70229 Atherosclerosis of native arteries of extremities with rest pain, unspecified extremity: Secondary | ICD-10-CM

## 2012-03-05 DIAGNOSIS — Z48812 Encounter for surgical aftercare following surgery on the circulatory system: Secondary | ICD-10-CM

## 2012-03-05 DIAGNOSIS — I70219 Atherosclerosis of native arteries of extremities with intermittent claudication, unspecified extremity: Secondary | ICD-10-CM

## 2012-03-05 NOTE — Progress Notes (Signed)
Patient returns for followup today. She has known lower extremity arterial occlusive disease. She previously underwent angioplasty and stenting of her left superficial femoral artery. She also had popliteal and tibial embolectomy with patch angioplasty of her left popliteal artery. 3 months ago she had a stroke and has residual right-sided weakness. She does not really describe classic claudication symptoms. However I do not believe that she ambulates a significant amount. She has quit smoking.  Review of systems: She denies shortness of breath. She denies chest pain. She has residual right-sided weakness from her previous stroke in the upper and lower extremity.  Physical exam: Filed Vitals:   03/05/12 1609  BP: 156/66  Pulse: 102  Resp: 18  Height: 5\' 3"  (1.6 m)  Weight: 224 lb (101.606 kg)  SpO2: 98%   Lower extremities: 2+ femoral pulses absent popliteal and pedal pulses  Skin: No ulcers or rashes on the feet  Neuro: Right upper extremity right lower extremity 4/ 5, left 5/5  Patient had bilateral ABIs in a duplex ultrasound today. ABI on the right was 0.46 left was 0.54 which is slightly improved from her last ABIs 3 months ago. She did have some dense of increased velocity in her SFA stent on the left side.  Assessment: Peripheral arterial disease with possibly some in-stent restenosis on the left lower extremity. I had a discussion with the patient today about proceeding with an arteriogram however this point she wishes to recover from her stroke more before any other interventions. She will return in 3 months time.  Plan: See above  Fabienne Bruns, MD [facility] [place] Office: [phone] Pager: [phone]

## 2012-03-09 NOTE — Procedures (Unsigned)
LOWER EXTREMITY ARTERIAL DUPLEX  INDICATION:  Lower extremity stent  HISTORY: Diabetes:  Yes Cardiac:  No Hypertension:  Yes Smoking:  Yes Previous Surgery:  Left superficial femoral artery PTA/stent, left popliteal and tibial peroneal trunk endarterectomy and PTA on 11/30/2010  SINGLE LEVEL ARTERIAL EXAM                         RIGHT                LEFT Brachial: Anterior tibial: Posterior tibial: Peroneal: Ankle/Brachial Index:  LOWER EXTREMITY ARTERIAL DUPLEX EXAM  DUPLEX:  Biphasic Doppler waveforms noted in the left common femoral and proximal profunda femoral arteries.  Monophasic Doppler waveforms noted throughout the superficial femoral and popliteal arteries.  There is a maximum velocity of 535 cm/s noted in the left proximal superficial femoral artery.  IMPRESSION: 1. Patent left superficial femoral, popliteal, tibial peroneal trunk     revascularization sites with maximum velocity as described above. 2. Bilateral ankle brachial indices are noted on a separate report.  ___________________________________________ [name]. Fields, MD  CH/MEDQ  D:  03/09/2012  T:  03/09/2012  Job:  [id number]

## 2012-04-21 ENCOUNTER — Telehealth: Payer: Self-pay

## 2012-04-21 NOTE — Telephone Encounter (Signed)
Pt. Called to report having sensation that right foot is heavy over past week.  Denies swelling.  States her right lower leg/foot looks more pale compared to the left leg.  States that she gets pain in the arch of right foot and in the joint of right great toe at times.  States has numbness in right foot at times.  Discussed w Kallie Edward, RN, NP.  Advised to schedule appt. Without any vascular studies at this time.  Pt. Had ABI's 03/05/12.

## 2012-04-22 ENCOUNTER — Encounter: Payer: Self-pay | Admitting: Vascular Surgery

## 2012-04-23 ENCOUNTER — Encounter: Payer: Self-pay | Admitting: Vascular Surgery

## 2012-04-23 ENCOUNTER — Ambulatory Visit (INDEPENDENT_AMBULATORY_CARE_PROVIDER_SITE_OTHER): Payer: Medicare Other | Admitting: Vascular Surgery

## 2012-04-23 VITALS — BP 112/95 | HR 79 | Temp 98.3°F | Ht 63.0 in | Wt 220.0 lb

## 2012-04-23 DIAGNOSIS — I70219 Atherosclerosis of native arteries of extremities with intermittent claudication, unspecified extremity: Secondary | ICD-10-CM

## 2012-04-23 DIAGNOSIS — I70229 Atherosclerosis of native arteries of extremities with rest pain, unspecified extremity: Secondary | ICD-10-CM

## 2012-04-23 NOTE — Progress Notes (Signed)
Patient is a 61 year old female with known severe peripheral arterial disease bilaterally. She has had a 2 to three-day history of pain in her right first toe. She states that the pain gets worse if there is a thunderstorm in the area. She has no open wounds on the foot. She has a history of an ABI less than 0.5 bilaterally. She states that she is ambulatory with her normal amount of claudication. She is currently taking Ultram with some benefit  Review of systems: She denies shortness of breath. She denies chest pain.  Physcal exam:  Filed Vitals:   04/23/12 1535  BP: 112/95  Pulse: 79  Temp: 98.3 F (36.8 C)  TempSrc: Oral  Height: 5\' 3"  (1.6 m)  Weight: 220 lb (99.791 kg)  SpO2: 98%   Right lower extremity: One plus right femoral pulse absent popliteal and pedal pulses right foot is pink and warm there is no open ulceration capillary refill is brisk  Assessment: Right first toe pain in a patient with known severe peripheral arterial disease. This currently seems more arthritic in nature than rest pain from ischemia. The lid best option at this point would be to place her on narcotics for a few days to see if her pain improves. I will see her back in 2 weeks for further evaluation. If the pain does not resolve we will need to consider repeat arteriogram. Patient was given prescriptions for Percocet today #40 dispensed.  She will stop her Ultram  Plan: See above

## 2012-05-06 ENCOUNTER — Encounter: Payer: Self-pay | Admitting: Vascular Surgery

## 2012-05-07 ENCOUNTER — Encounter: Payer: Self-pay | Admitting: Vascular Surgery

## 2012-05-07 ENCOUNTER — Ambulatory Visit (INDEPENDENT_AMBULATORY_CARE_PROVIDER_SITE_OTHER): Payer: Medicare Other | Admitting: Vascular Surgery

## 2012-05-07 VITALS — BP 136/74 | HR 88 | Resp 20 | Ht 63.0 in | Wt 220.0 lb

## 2012-05-07 DIAGNOSIS — I739 Peripheral vascular disease, unspecified: Secondary | ICD-10-CM

## 2012-05-07 NOTE — Progress Notes (Signed)
Patient is a 61-year-old female with known severe peripheral arterial disease bilaterally. She has had a 2-3 week  history of pain in her right first toe.  The pain is now present in the entire forefoot She states that the pain gets worse if there is a thunderstorm in the area. She has no open wounds on the foot. She has a history of an ABI less than 0.5 bilaterally. She states that she is ambulatory with her normal amount of claudication. She is currently taking Percocet but still has pain in the foot. She states also she feels like the foot has a heavy sensation.   Review of systems: She denies shortness of breath. She denies chest pain.  Physcal exam:  Filed Vitals:   05/07/12 1510  BP: 136/74  Pulse: 88  Resp: 20  Height: 5' 3" (1.6 m)  Weight: 220 lb (99.791 kg)  Right lower extremity: One plus right femoral pulse absent popliteal and pedal pulses right foot is pink and warm there is no open ulceration capillary refill is brisk   Assessment: Right first toe pain in a patient with known severe peripheral arterial disease. This currently seems more arthritic in nature than rest pain from ischemia.  Patient was given prescriptions for Percocet today #40 dispensed. Unfortunately the pain in her right foot has not resolved. This is probably multifactorial but due to the fact that she has decreased ABIs on that side I believe she needs an arteriogram. We have scheduled this for July 27. Risks benefits possible complications and procedure details were trying to the patient today.  Plan: See above  Luverne Farone, MD [facility] [place] Office: [phone] Pager: [phone]   

## 2012-05-12 ENCOUNTER — Encounter (HOSPITAL_COMMUNITY): Payer: Self-pay | Admitting: Pharmacy Technician

## 2012-05-13 ENCOUNTER — Other Ambulatory Visit: Payer: Self-pay

## 2012-05-15 ENCOUNTER — Encounter (HOSPITAL_COMMUNITY)
Admission: RE | Disposition: A | Discharge status: Home / Self Care | Payer: Self-pay | Source: Ambulatory Visit | Attending: Vascular Surgery

## 2012-05-15 ENCOUNTER — Ambulatory Visit (HOSPITAL_COMMUNITY)
Admission: RE | Admit: 2012-05-15 | Discharge: 2012-05-15 | Disposition: A | Discharge status: Home / Self Care | Payer: Medicare Other | Source: Ambulatory Visit | Attending: Vascular Surgery | Admitting: Vascular Surgery

## 2012-05-15 ENCOUNTER — Telehealth: Payer: Self-pay | Admitting: Vascular Surgery

## 2012-05-15 ENCOUNTER — Other Ambulatory Visit: Payer: Self-pay | Admitting: *Deleted

## 2012-05-15 DIAGNOSIS — I70219 Atherosclerosis of native arteries of extremities with intermittent claudication, unspecified extremity: Secondary | ICD-10-CM

## 2012-05-15 DIAGNOSIS — Z48812 Encounter for surgical aftercare following surgery on the circulatory system: Secondary | ICD-10-CM

## 2012-05-15 DIAGNOSIS — I739 Peripheral vascular disease, unspecified: Secondary | ICD-10-CM

## 2012-05-15 HISTORY — PX: ABDOMINAL AORTAGRAM: SHX5454

## 2012-05-15 LAB — POCT I-STAT, CHEM 8
BUN: 17 mg/dL (ref 6–23)
Calcium, Ion: 1.19 mmol/L (ref 1.13–1.30)
Chloride: 108 mEq/L (ref 96–112)
Glucose, Bld: 157 mg/dL — ABNORMAL HIGH (ref 70–99)
HCT: 36 % (ref 36.0–46.0)
Potassium: 4.7 mEq/L (ref 3.5–5.1)

## 2012-05-15 LAB — GLUCOSE, CAPILLARY: Glucose-Capillary: 185 mg/dL — ABNORMAL HIGH (ref 70–99)

## 2012-05-15 SURGERY — ABDOMINAL AORTAGRAM
Anesthesia: LOCAL

## 2012-05-15 MED ORDER — ACETAMINOPHEN 325 MG PO TABS: 325.0000 mg | ORAL_TABLET | ORAL | Status: DC | PRN

## 2012-05-15 MED ORDER — ACETAMINOPHEN 325 MG RE SUPP: 325.0000 mg | RECTAL | Status: DC | PRN

## 2012-05-15 MED ORDER — SODIUM CHLORIDE 0.9 % IV SOLN
INTRAVENOUS | Status: DC
  Administered 2012-05-15: 07:00:00 via INTRAVENOUS

## 2012-05-15 MED ORDER — OXYCODONE HCL 5 MG PO TABS: 5.0000 mg | ORAL_TABLET | ORAL | Status: DC | PRN

## 2012-05-15 MED ORDER — LIDOCAINE HCL (PF) 1 % IJ SOLN
INTRAMUSCULAR | Status: AC
  Filled 2012-05-15: qty 30

## 2012-05-15 MED ORDER — ALUM & MAG HYDROXIDE-SIMETH 200-200-20 MG/5ML PO SUSP: 15.0000 mL | ORAL | Status: DC | PRN

## 2012-05-15 MED ORDER — LABETALOL HCL 5 MG/ML IV SOLN: 10.0000 mg | INTRAVENOUS | Status: DC | PRN

## 2012-05-15 MED ORDER — GUAIFENESIN-DM 100-10 MG/5ML PO SYRP: 15.0000 mL | ORAL_SOLUTION | ORAL | Status: DC | PRN

## 2012-05-15 MED ORDER — ONDANSETRON HCL 4 MG/2ML IJ SOLN
4.0000 mg | Freq: Four times a day (QID) | INTRAMUSCULAR | Status: DC | PRN
  Administered 2012-05-15: 4 mg via INTRAVENOUS

## 2012-05-15 MED ORDER — HEPARIN (PORCINE) IN NACL 2-0.9 UNIT/ML-% IJ SOLN
INTRAMUSCULAR | Status: AC
  Filled 2012-05-15: qty 1000

## 2012-05-15 MED ORDER — HYDRALAZINE HCL 20 MG/ML IJ SOLN: 10.0000 mg | INTRAMUSCULAR | Status: DC | PRN

## 2012-05-15 MED ORDER — SODIUM CHLORIDE 0.45 % IV SOLN
INTRAVENOUS | Status: DC
  Administered 2012-05-15: 10:00:00 via INTRAVENOUS

## 2012-05-15 MED ORDER — PHENOL 1.4 % MT LIQD: 1.0000 | OROMUCOSAL | Status: DC | PRN

## 2012-05-15 MED ORDER — ONDANSETRON HCL 4 MG/2ML IJ SOLN
INTRAMUSCULAR | Status: AC
  Filled 2012-05-15: qty 2

## 2012-05-15 MED ORDER — MORPHINE SULFATE 4 MG/ML IJ SOLN
INTRAMUSCULAR | Status: AC
  Administered 2012-05-15: 4 mg
  Filled 2012-05-15: qty 1

## 2012-05-15 MED ORDER — METOPROLOL TARTRATE 1 MG/ML IV SOLN: 2.0000 mg | INTRAVENOUS | Status: DC | PRN

## 2012-05-15 MED ORDER — MORPHINE SULFATE 10 MG/ML IJ SOLN: 2.0000 mg | INTRAMUSCULAR | Status: DC | PRN

## 2012-05-15 NOTE — H&P (View-Only) (Signed)
Patient is a 61 year old female with known severe peripheral arterial disease bilaterally. She has had a 2-3 week  history of pain in her right first toe.  The pain is now present in the entire forefoot She states that the pain gets worse if there is a thunderstorm in the area. She has no open wounds on the foot. She has a history of an ABI less than 0.5 bilaterally. She states that she is ambulatory with her normal amount of claudication. She is currently taking Percocet but still has pain in the foot. She states also she feels like the foot has a heavy sensation.   Review of systems: She denies shortness of breath. She denies chest pain.  Physcal exam:  Filed Vitals:   05/07/12 1510  BP: 136/74  Pulse: 88  Resp: 20  Height: 5\' 3"  (1.6 m)  Weight: 220 lb (99.791 kg)  Right lower extremity: One plus right femoral pulse absent popliteal and pedal pulses right foot is pink and warm there is no open ulceration capillary refill is brisk   Assessment: Right first toe pain in a patient with known severe peripheral arterial disease. This currently seems more arthritic in nature than rest pain from ischemia.  Patient was given prescriptions for Percocet today #40 dispensed. Unfortunately the pain in her right foot has not resolved. This is probably multifactorial but due to the fact that she has decreased ABIs on that side I believe she needs an arteriogram. We have scheduled this for July 27. Risks benefits possible complications and procedure details were trying to the patient today.  Plan: See above  Fabienne Bruns, MD [facility] [place] Office: [phone] Pager: [phone]

## 2012-05-15 NOTE — Telephone Encounter (Addendum)
Message copied by Shari Prows on Fri May 15, 2012 10:22 AM ------      Message from: Sherren Kerns      Created: Fri May 15, 2012  9:41 AM       Aortogram with bilat runoff      2nd order right ext iliac      Ultrasound            I offered her a right fem pop bypass.  She will call if she wishes to have this scheduled.  If she wants a fem pop she needs cardiology eval preop.  Please schedule her a 6 mo appt with ABI and duplex of left leg either way and follow up with Rusty in 6 months.            Fabienne Bruns, MD      [facility] [place]      Office: [phone]      Pager: [phone]       I scheduled an appointment for the above patient on 11/19/12 at 1pm. LM for pt regarding this appt and also mailed appt letter. Jacklyn Shell

## 2012-05-15 NOTE — Op Note (Signed)
Procedure: Aortogram with bilateral lower extremity runoff  Preoperative diagnosis: Claudication  Postoperative diagnosis: Same  Anesthesia: Local  Operative details: After obtaining informed consent, the patient was taken to the PV lab. The patient was placed in supine position the Angio table. Both groins were prepped and draped in usual sterile fashion. Local anesthesia was infiltrated over the left common femoral artery. Ultrasound was used to identify the left common femoral artery. An introducer needle was used to cannulate the left common femoral artery and an 035 first core wire threaded into the abdominal aorta under fluoroscopic guidance. Next a 5 French sheath was placed over the guidewire into the left common femoral artery. This is thoroughly flushed with heparinized saline. A 5 French pigtail catheter was then placed over the guidewire up in the abdominal aorta. Abdominal aortogram was obtained. This shows a patent left and right renal arteries. Infrarenal abdominal aorta is patent. The left and right common iliac internal iliac and external iliac arteries are patent. The pigtail catheter was then pulled down to some of the aortic bifurcation. A pelvic angiogram was obtained. This confirmed the above findings of a patent aortoiliac system. Next bilateral lower extremity runoff views were obtained through the pigtail catheter.  In the right lower extremity, the right common femoral artery is patent. The profunda femoris artery is patent. The right superficial femoral artery is occluded at its origin. This is a very lengthy occlusion. The above-knee popliteal artery is reconstituted via profunda collaterals. The patient has a slightly aberrant anatomy with the tibial vessels coming off essentially behind the knee joint. The posterior tibial artery is occluded. The anterior tibial artery is patent. The peroneal artery is patent. However this is a diminutive vessel.  In the left lower extremity  the left common femoral artery is patent. The left superficial femoral artery is patent. There is a 70% stenosis several centimeters above the left superficial femoral artery stent. There is a stent that extends from the middle third of the left superficial femoral artery to the above-knee popliteal artery. This is patent with minimal in-stent restenosis. The popliteal artery is patent. The tibial peroneal trunk is occluded. The anterior tibial artery is patent in its proximal third but then occludes. All 3 tibial vessels then reconstituted via collaterals. There is three-vessel filling of the foot.  The patient tolerated the procedure well and there were no complications. The patient was taken to the holding area in stable condition. The sheath was left in the left common femoral artery to be pulled in the holding area.  Operative findings: #1 left lower extremity 70% stenosis in the proximal left superficial femoral artery with patent left superficial femoral stent below this. Occlusion of left tibia peroneal trunk and proximal aspect of all 3 tibial vessels.  Reconstitution of all 3 tibial vessels distally to the foot.  #2 right lower extremity long segment right superficial femoral artery occlusion with reconstitution of the above-knee popliteal artery and two-vessel runoff via the anterior tibial artery and peroneal artery although the peroneal artery was very diseased  Management: The patient was offered a right femoral to above-knee popliteal bypass and she will think about whether or not she wishes is done at this point. If she wishes to have a right femoropopliteal done she would need cardiac risk stratification preoperatively. As far as her left superficial femoral artery stenosis is concerned her right leg is actually worse at this point. However if she decided her left leg was bothering her more we could  come back and do a right femoral puncture and potentially treat the left superficial femoral  artery stenosis.  Fabienne Bruns, MD [facility] Office: [phone] Pager: [phone]

## 2012-05-15 NOTE — Interval H&P Note (Signed)
History and Physical Interval Note:  05/15/2012 8:38 AM  [name]  has presented today for surgery, with the diagnosis of pvd  The various methods of treatment have been discussed with the patient and family. After consideration of risks, benefits and other options for treatment, the patient has consented to  Procedure(s) (LRB): ABDOMINAL AORTAGRAM (N/A) as a surgical intervention .  The patient's history has been reviewed, patient examined, no change in status, stable for surgery.  I have reviewed the patients' chart and labs.  Questions were answered to the patient's satisfaction.     Amita Atayde E

## 2012-05-15 NOTE — Progress Notes (Signed)
LEFT GROIN STABLE NOT RIGHT GROIN

## 2012-05-15 NOTE — Progress Notes (Signed)
UP AND WALKED AND TOL WELL; RIGHT GROIN STABLE,NO BLEEDING OR HEMATOMA 

## 2012-05-15 NOTE — Progress Notes (Signed)
C/O NAUSEA AND MED GIVEN

## 2012-05-18 ENCOUNTER — Other Ambulatory Visit: Payer: Self-pay | Admitting: Cardiology

## 2012-05-18 ENCOUNTER — Encounter (HOSPITAL_COMMUNITY): Payer: Self-pay | Admitting: Emergency Medicine

## 2012-05-18 ENCOUNTER — Emergency Department (HOSPITAL_COMMUNITY)
Admission: EM | Admit: 2012-05-18 | Discharge: 2012-05-18 | Disposition: A | Discharge status: Home / Self Care | Payer: Medicare Other | Attending: Emergency Medicine | Admitting: Emergency Medicine

## 2012-05-18 DIAGNOSIS — E1149 Type 2 diabetes mellitus with other diabetic neurological complication: Secondary | ICD-10-CM | POA: Insufficient documentation

## 2012-05-18 DIAGNOSIS — I739 Peripheral vascular disease, unspecified: Secondary | ICD-10-CM

## 2012-05-18 DIAGNOSIS — J449 Chronic obstructive pulmonary disease, unspecified: Secondary | ICD-10-CM | POA: Insufficient documentation

## 2012-05-18 DIAGNOSIS — Z833 Family history of diabetes mellitus: Secondary | ICD-10-CM | POA: Insufficient documentation

## 2012-05-18 DIAGNOSIS — J4489 Other specified chronic obstructive pulmonary disease: Secondary | ICD-10-CM | POA: Insufficient documentation

## 2012-05-18 DIAGNOSIS — Z9089 Acquired absence of other organs: Secondary | ICD-10-CM | POA: Insufficient documentation

## 2012-05-18 DIAGNOSIS — F17200 Nicotine dependence, unspecified, uncomplicated: Secondary | ICD-10-CM | POA: Insufficient documentation

## 2012-05-18 DIAGNOSIS — Z800 Family history of malignant neoplasm of digestive organs: Secondary | ICD-10-CM | POA: Insufficient documentation

## 2012-05-18 DIAGNOSIS — Z794 Long term (current) use of insulin: Secondary | ICD-10-CM | POA: Insufficient documentation

## 2012-05-18 DIAGNOSIS — I2510 Atherosclerotic heart disease of native coronary artery without angina pectoris: Secondary | ICD-10-CM | POA: Insufficient documentation

## 2012-05-18 DIAGNOSIS — Z801 Family history of malignant neoplasm of trachea, bronchus and lung: Secondary | ICD-10-CM | POA: Insufficient documentation

## 2012-05-18 DIAGNOSIS — G8929 Other chronic pain: Secondary | ICD-10-CM | POA: Insufficient documentation

## 2012-05-18 DIAGNOSIS — E785 Hyperlipidemia, unspecified: Secondary | ICD-10-CM | POA: Insufficient documentation

## 2012-05-18 DIAGNOSIS — I252 Old myocardial infarction: Secondary | ICD-10-CM | POA: Insufficient documentation

## 2012-05-18 DIAGNOSIS — E1142 Type 2 diabetes mellitus with diabetic polyneuropathy: Secondary | ICD-10-CM | POA: Insufficient documentation

## 2012-05-18 DIAGNOSIS — M79609 Pain in unspecified limb: Secondary | ICD-10-CM | POA: Insufficient documentation

## 2012-05-18 DIAGNOSIS — Z803 Family history of malignant neoplasm of breast: Secondary | ICD-10-CM | POA: Insufficient documentation

## 2012-05-18 DIAGNOSIS — G4730 Sleep apnea, unspecified: Secondary | ICD-10-CM | POA: Insufficient documentation

## 2012-05-18 DIAGNOSIS — I10 Essential (primary) hypertension: Secondary | ICD-10-CM | POA: Insufficient documentation

## 2012-05-18 DIAGNOSIS — K219 Gastro-esophageal reflux disease without esophagitis: Secondary | ICD-10-CM | POA: Insufficient documentation

## 2012-05-18 DIAGNOSIS — Z8249 Family history of ischemic heart disease and other diseases of the circulatory system: Secondary | ICD-10-CM | POA: Insufficient documentation

## 2012-05-18 DIAGNOSIS — Z86718 Personal history of other venous thrombosis and embolism: Secondary | ICD-10-CM | POA: Insufficient documentation

## 2012-05-18 DIAGNOSIS — Z9861 Coronary angioplasty status: Secondary | ICD-10-CM | POA: Insufficient documentation

## 2012-05-18 MED ORDER — OXYCODONE-ACETAMINOPHEN 5-325 MG PO TABS: 1.0000 | ORAL_TABLET | ORAL | Status: AC | PRN

## 2012-05-18 MED ORDER — CLOPIDOGREL BISULFATE 75 MG PO TABS: 75.0000 mg | ORAL_TABLET | Freq: Every day | ORAL | Status: DC

## 2012-05-18 NOTE — ED Provider Notes (Addendum)
History    This chart was scribed for Sherri Baker, MD, MD by Smitty Pluck. The patient was seen in room TR02C and the patient's care was started at 6:19PM.   CSN: [id number]  Arrival date & time 05/18/12  1647   First MD Initiated Contact with Patient 05/18/12 1815      Chief Complaint  Patient presents with  . Leg Pain    (Consider location/radiation/quality/duration/timing/severity/associated sxs/prior treatment) The history is provided by the patient.   [name] is a 61 y.o. female who presents to the Emergency Department complaining of moderate chronic right leg pain worsening 2 days ago. 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 She reports stiffness in leg that has occurred since onset. Reports using walker at home. Pt reports that she had an arteriole study 3 days and she had dye placed in leg. Pt is completely blocked in right leg. Pt report that she has an appointment with vascular surgeon in 6 months. Pt reports taking plavix. Denies any other pain. Denies any radiation. Pt has baseline numbness and pain of her right leg   Past Medical History  Diagnosis Date  . GERD (gastroesophageal reflux disease)   . PAD (peripheral artery disease)     Stent, embolectomy, surgery  /     Dr. Darrick Penna, ABI 0.52 right, 0.54 left, February, 2010  . COPD (chronic obstructive pulmonary disease)     Dr. Craige Cotta, medications, PFTs August, 2012  . OSA (obstructive sleep apnea)     Dr. Craige Cotta  CPAP started August, 2012  . Systolic and diastolic CHF, chronic   . CAD (coronary artery disease)     June, 2012, DES to RCA, staged DES to circumflex and DES to diagonal with pressure wire to LAD, FFR 0.84  . DM (diabetes mellitus), type 2   . Hypertension   . Morbid obesity   . Hyperlipidemia   . Ejection fraction < 50%     EF 45-50%, echo, June, 2012  . Diastolic dysfunction     grade 2, echo, June, 2012  . Peripheral  neuropathy   . Tobacco abuse   . Syncope     vasovagal  . Breast cancer     lumpectomy and radiation 2002  . MI, old   . Myocardial infarction 10/19/11  . DVT (deep venous thrombosis)     Past Surgical History  Procedure Date  . Appendectomy   . Cholecystectomy   . Tubal ligation   . Breast surgery     Left breast cancer  . Cardiac catheterization 2002  . Popliteal artery angioplasty 11/30/10    Left leg w/ embolectomy, endarterectomy  by Dr.Charles Fields  . Coronary angioplasty with stent placement   . Pr vein bypass graft,aorto-fem-pop     Family History  Problem Relation Age of Onset  . Hypertension Sister   . Diabetes Sister   . Hyperlipidemia Sister   . Hyperlipidemia Sister   . Hyperlipidemia Brother   . Diabetes Brother   . Lung cancer Father   . Cancer Father   . Heart disease Father   . Hypertension Father   . Stomach cancer Sister   . Diabetes Daughter     History  Substance Use Topics  . Smoking status: Former Smoker -- 1.5 packs/day for 44 years    Types: Cigarettes    Quit date: 11/30/2010  . Smokeless tobacco: Never Used  . Alcohol Use:  No    OB History    Grav Para Term Preterm Abortions TAB SAB Ect Mult Living                  Review of Systems  Constitutional: Negative for fever and chills.  Respiratory: Negative for shortness of breath.   Gastrointestinal: Negative for nausea and vomiting.  Neurological: Negative for weakness.    Allergies  Review of patient's allergies indicates no known allergies.  Home Medications   Current Outpatient Rx  Name Route Sig Dispense Refill  . IPRATROPIUM-ALBUTEROL 18-103 MCG/ACT IN AERO Inhalation Inhale 2 puffs into the lungs every 3 (three) hours as needed. For shortness of breath.    . AMLODIPINE BESYLATE 5 MG PO TABS Oral Take 5 mg by mouth daily.     . ASPIRIN EC 81 MG PO TBEC Oral Take 81 mg by mouth daily.    Marland Kitchen CLINDAMYCIN HCL 150 MG PO CAPS Oral Take 150 mg by mouth daily as needed.  When patient feels that cellulitis in foot is going to flare up.     . CLOPIDOGREL BISULFATE 75 MG PO TABS Oral Take 1 tablet (75 mg total) by mouth daily. 30 tablet 1    Patient needs appointment for future refills.  . FUROSEMIDE 40 MG PO TABS Oral Take 40 mg by mouth daily.      Marland Kitchen GLIMEPIRIDE 4 MG PO TABS Oral Take 1 tablet (4 mg total) by mouth daily with breakfast. 30 tablet 1  . INSULIN GLARGINE 100 UNIT/ML [place] SOLN Subcutaneous Inject 30-45 Units into the skin at bedtime. 45 units at night and 30 in the morning    . INSULIN REGULAR HUMAN 100 UNIT/ML IJ SOLN Subcutaneous Inject into the skin 4 (four) times daily as needed. Per sliding scale.    Marland Kitchen LEVETIRACETAM 250 MG PO TABS Oral Take 250 mg by mouth every 12 (twelve) hours.    Marland Kitchen METOCLOPRAMIDE HCL 5 MG PO TABS Oral Take 5 mg by mouth 3 (three) times daily.     Marland Kitchen METOPROLOL TARTRATE 25 MG PO TABS Oral Take 25 mg by mouth 2 (two) times daily.     Marland Kitchen NITROGLYCERIN 0.4 MG SL SUBL Sublingual Place 1 tablet (0.4 mg total) under the tongue every 5 (five) minutes as needed for chest pain. 25 tablet 3  . ONDANSETRON HCL 4 MG PO TABS Oral Take 4 mg by mouth every 8 (eight) hours as needed. Nausea     . PANTOPRAZOLE SODIUM 40 MG PO TBEC Oral Take 40 mg by mouth daily.      Marland Kitchen POTASSIUM CHLORIDE CRYS ER 20 MEQ PO TBCR Oral Take 1 tablet (20 mEq total) by mouth daily at 12 noon. 30 tablet 1  . ROSUVASTATIN CALCIUM 40 MG PO TABS Oral Take 40 mg by mouth daily.        BP 122/75  Pulse 103  Temp 98.5 F (36.9 C) (Oral)  Resp 18  SpO2 95%  Physical Exam  Nursing note and vitals reviewed. Constitutional: She is oriented to person, place, and time. She appears well-developed and well-nourished. No distress.  HENT:  Head: Normocephalic and atraumatic.  Eyes: EOM are normal. Pupils are equal, round, and reactive to light.  Neck: Normal range of motion. Neck supple. No tracheal deviation present.  Cardiovascular: Normal rate.   Pulmonary/Chest: Effort  normal. No respiratory distress.  Abdominal: Soft. She exhibits no distension.  Musculoskeletal: Normal range of motion.       Right foot  pink and warm to touch  No signs of ischemia   DP pulse palpable  No leg edema and legs are symmetric in size  Neurological: She is alert and oriented to person, place, and time.  Skin: Skin is warm and dry.  Psychiatric: She has a normal mood and affect. Her behavior is normal.    ED Course  Procedures (including critical care time) DIAGNOSTIC STUDIES: Oxygen Saturation is 95% on room air, normal by my interpretation.    COORDINATION OF CARE:    Labs Reviewed - No data to display No results found.   No diagnosis found.    MDM  Pt without evidence of limb ischemia, suspect that she has worsening claudication, known h/o PVD--will see vasc MD  I personally performed the services described in this documentation, which was scribed in my presence. The recorded information has been reviewed and considered.         Sherri Baker, MD 05/18/12 1837  Sherri Baker, MD 05/18/12 [phone]

## 2012-05-18 NOTE — ED Notes (Signed)
Pt c/o left leg pain after having dye study done on Friday; pt sts painful to lift and move; pt denies obvious injury

## 2012-05-20 ENCOUNTER — Telehealth: Payer: Self-pay | Admitting: Vascular Surgery

## 2012-05-20 NOTE — Telephone Encounter (Signed)
Dr Darrick Penna had requested a cardiac evaluation within the next couple of weeks in preparation of a right fem-pop bypass. I originally tried to schedule this with Dr Myrtis Ser with [facility] in the [place] office for convenience, but Judeth Cornfield stated that Dr Myrtis Ser did not have anything until September. It was ultimately decided to schedule at the [facility] location.  I spoke with Mitzi Davenport to notify of her appointment with Tereso Newcomer, PA at [address] on 05/28/12 @ 12:00 for her cardiac eval. Pt understood and repeated appointment information back to me. dpm

## 2012-05-28 ENCOUNTER — Ambulatory Visit: Payer: Medicare Other | Admitting: Physician Assistant

## 2012-05-28 ENCOUNTER — Telehealth: Payer: Self-pay

## 2012-05-28 NOTE — Telephone Encounter (Signed)
C/o increased pain and numbness in right lower leg from knee and down to foot.  States the increased pain started yesterday.  C/o so much pain, it is hard to walk.  States using a walker.  Also states when she bends her leg at the knee, she had difficulty straightening it.  C/o a new tender spot on right knee;  denies  Redness or warmth at site.  Denies fever or chills.  Denies any change in temperature of right foot or leg.  Advised pt. If pain worsens or notices increased numbness or cold temperature of right foot or leg tonight, to go to ER at [facility]  Appt. Given to see NP in am at 10:20 am 7/19.  Verb. Understanding.

## 2012-05-29 ENCOUNTER — Encounter (HOSPITAL_COMMUNITY): Payer: Self-pay | Admitting: Pharmacy Technician

## 2012-05-29 ENCOUNTER — Encounter: Payer: Self-pay | Admitting: Neurosurgery

## 2012-05-29 ENCOUNTER — Ambulatory Visit (HOSPITAL_COMMUNITY): Payer: Medicare Other | Admitting: Anesthesiology

## 2012-05-29 ENCOUNTER — Ambulatory Visit (INDEPENDENT_AMBULATORY_CARE_PROVIDER_SITE_OTHER): Payer: Medicare Other | Admitting: Neurosurgery

## 2012-05-29 ENCOUNTER — Ambulatory Visit (HOSPITAL_COMMUNITY): Payer: Medicare Other

## 2012-05-29 ENCOUNTER — Encounter (HOSPITAL_COMMUNITY): Payer: Self-pay | Admitting: *Deleted

## 2012-05-29 ENCOUNTER — Encounter (HOSPITAL_COMMUNITY): Payer: Self-pay | Admitting: Anesthesiology

## 2012-05-29 ENCOUNTER — Inpatient Hospital Stay (HOSPITAL_COMMUNITY)
Admission: AD | Admit: 2012-05-29 | Discharge: 2012-06-01 | DRG: 253 | Disposition: A | Discharge status: Home / Self Care | Payer: Medicare Other | Source: Ambulatory Visit | Attending: Vascular Surgery | Admitting: Vascular Surgery

## 2012-05-29 ENCOUNTER — Encounter (HOSPITAL_COMMUNITY)
Admission: AD | Disposition: A | Discharge status: Home / Self Care | Payer: Self-pay | Source: Ambulatory Visit | Attending: Vascular Surgery

## 2012-05-29 ENCOUNTER — Other Ambulatory Visit: Payer: Self-pay | Admitting: *Deleted

## 2012-05-29 VITALS — BP 139/71 | HR 80 | Temp 98.0°F | Resp 18 | Ht 63.0 in | Wt 215.0 lb

## 2012-05-29 DIAGNOSIS — I252 Old myocardial infarction: Secondary | ICD-10-CM

## 2012-05-29 DIAGNOSIS — J449 Chronic obstructive pulmonary disease, unspecified: Secondary | ICD-10-CM | POA: Diagnosis present

## 2012-05-29 DIAGNOSIS — I70209 Unspecified atherosclerosis of native arteries of extremities, unspecified extremity: Principal | ICD-10-CM | POA: Diagnosis present

## 2012-05-29 DIAGNOSIS — I509 Heart failure, unspecified: Secondary | ICD-10-CM | POA: Diagnosis present

## 2012-05-29 DIAGNOSIS — E119 Type 2 diabetes mellitus without complications: Secondary | ICD-10-CM | POA: Diagnosis present

## 2012-05-29 DIAGNOSIS — G4733 Obstructive sleep apnea (adult) (pediatric): Secondary | ICD-10-CM | POA: Diagnosis present

## 2012-05-29 DIAGNOSIS — Z6836 Body mass index (BMI) 36.0-36.9, adult: Secondary | ICD-10-CM

## 2012-05-29 DIAGNOSIS — M1990 Unspecified osteoarthritis, unspecified site: Secondary | ICD-10-CM | POA: Diagnosis present

## 2012-05-29 DIAGNOSIS — I10 Essential (primary) hypertension: Secondary | ICD-10-CM | POA: Diagnosis present

## 2012-05-29 DIAGNOSIS — I739 Peripheral vascular disease, unspecified: Secondary | ICD-10-CM

## 2012-05-29 DIAGNOSIS — I70219 Atherosclerosis of native arteries of extremities with intermittent claudication, unspecified extremity: Secondary | ICD-10-CM

## 2012-05-29 DIAGNOSIS — J4489 Other specified chronic obstructive pulmonary disease: Secondary | ICD-10-CM | POA: Diagnosis present

## 2012-05-29 DIAGNOSIS — I2510 Atherosclerotic heart disease of native coronary artery without angina pectoris: Secondary | ICD-10-CM | POA: Diagnosis present

## 2012-05-29 DIAGNOSIS — K219 Gastro-esophageal reflux disease without esophagitis: Secondary | ICD-10-CM | POA: Diagnosis present

## 2012-05-29 DIAGNOSIS — Z86718 Personal history of other venous thrombosis and embolism: Secondary | ICD-10-CM

## 2012-05-29 DIAGNOSIS — F411 Generalized anxiety disorder: Secondary | ICD-10-CM | POA: Diagnosis present

## 2012-05-29 DIAGNOSIS — Z8673 Personal history of transient ischemic attack (TIA), and cerebral infarction without residual deficits: Secondary | ICD-10-CM

## 2012-05-29 DIAGNOSIS — I5042 Chronic combined systolic (congestive) and diastolic (congestive) heart failure: Secondary | ICD-10-CM | POA: Diagnosis present

## 2012-05-29 DIAGNOSIS — E876 Hypokalemia: Secondary | ICD-10-CM | POA: Diagnosis present

## 2012-05-29 HISTORY — PX: INTRAOPERATIVE ARTERIOGRAM: SHX5157

## 2012-05-29 HISTORY — PX: FEMORAL-POPLITEAL BYPASS GRAFT: SHX937

## 2012-05-29 LAB — URINE MICROSCOPIC-ADD ON

## 2012-05-29 LAB — URINALYSIS, ROUTINE W REFLEX MICROSCOPIC
Nitrite: NEGATIVE
Protein, ur: >300 mg/dL — AB
Urobilinogen, UA: 0.2 mg/dL (ref 0.0–1.0)

## 2012-05-29 LAB — PROTIME-INR: Prothrombin Time: 13 seconds (ref 11.6–15.2)

## 2012-05-29 LAB — COMPREHENSIVE METABOLIC PANEL
ALT: 12 U/L (ref 0–35)
AST: 15 U/L (ref 0–37)
Albumin: 3.2 g/dL — ABNORMAL LOW (ref 3.5–5.2)
Calcium: 9.4 mg/dL (ref 8.4–10.5)
Chloride: 102 mEq/L (ref 96–112)
Creatinine, Ser: 0.86 mg/dL (ref 0.50–1.10)
Sodium: 137 mEq/L (ref 135–145)

## 2012-05-29 LAB — CBC
MCH: 28.9 pg (ref 26.0–34.0)
MCV: 85.7 fL (ref 78.0–100.0)
Platelets: 267 10*3/uL (ref 150–400)
RBC: 4.7 MIL/uL (ref 3.87–5.11)
RDW: 12 % (ref 11.5–15.5)
WBC: 12.5 10*3/uL — ABNORMAL HIGH (ref 4.0–10.5)

## 2012-05-29 LAB — GLUCOSE, CAPILLARY: Glucose-Capillary: 145 mg/dL — ABNORMAL HIGH (ref 70–99)

## 2012-05-29 LAB — SURGICAL PCR SCREEN: MRSA, PCR: NEGATIVE

## 2012-05-29 LAB — PREPARE RBC (CROSSMATCH)

## 2012-05-29 SURGERY — BYPASS GRAFT FEMORAL-POPLITEAL ARTERY
Anesthesia: General | Site: Leg Upper | Laterality: Right

## 2012-05-29 MED ORDER — ATORVASTATIN CALCIUM 80 MG PO TABS
80.0000 mg | ORAL_TABLET | Freq: Every day | ORAL | Status: DC
  Administered 2012-05-30: 80 mg via ORAL
  Filled 2012-05-29 (×3): qty 1

## 2012-05-29 MED ORDER — PROPOFOL 10 MG/ML IV EMUL
INTRAVENOUS | Status: DC | PRN
  Administered 2012-05-29: 150 mg via INTRAVENOUS

## 2012-05-29 MED ORDER — PANTOPRAZOLE SODIUM 40 MG PO TBEC: 40.0000 mg | DELAYED_RELEASE_TABLET | Freq: Every day | ORAL | Status: DC

## 2012-05-29 MED ORDER — GLYCOPYRROLATE 0.2 MG/ML IJ SOLN
INTRAMUSCULAR | Status: DC | PRN
  Administered 2012-05-29: 0.4 mg via INTRAVENOUS

## 2012-05-29 MED ORDER — POTASSIUM CHLORIDE CRYS ER 20 MEQ PO TBCR: 20.0000 meq | EXTENDED_RELEASE_TABLET | Freq: Every day | ORAL | Status: DC | PRN

## 2012-05-29 MED ORDER — SODIUM CHLORIDE 0.9 % IV SOLN: 500.0000 mL | Freq: Once | INTRAVENOUS | Status: AC | PRN

## 2012-05-29 MED ORDER — METOCLOPRAMIDE HCL 5 MG PO TABS
5.0000 mg | ORAL_TABLET | Freq: Three times a day (TID) | ORAL | Status: DC
  Administered 2012-05-30 – 2012-06-01 (×5): 5 mg via ORAL
  Filled 2012-05-29 (×10): qty 1

## 2012-05-29 MED ORDER — POTASSIUM CHLORIDE CRYS ER 20 MEQ PO TBCR
20.0000 meq | EXTENDED_RELEASE_TABLET | Freq: Every day | ORAL | Status: DC
  Administered 2012-05-30 – 2012-06-01 (×3): 20 meq via ORAL
  Filled 2012-05-29 (×3): qty 1

## 2012-05-29 MED ORDER — ONDANSETRON HCL 4 MG/2ML IJ SOLN
INTRAMUSCULAR | Status: DC | PRN
  Administered 2012-05-29: 4 mg via INTRAVENOUS

## 2012-05-29 MED ORDER — MUPIROCIN 2 % EX OINT
TOPICAL_OINTMENT | Freq: Once | CUTANEOUS | Status: AC
  Administered 2012-05-29: 13:00:00 via NASAL

## 2012-05-29 MED ORDER — PANTOPRAZOLE SODIUM 40 MG PO TBEC
40.0000 mg | DELAYED_RELEASE_TABLET | Freq: Every day | ORAL | Status: DC
  Administered 2012-05-30 – 2012-06-01 (×3): 40 mg via ORAL
  Filled 2012-05-29 (×2): qty 1

## 2012-05-29 MED ORDER — IPRATROPIUM-ALBUTEROL 18-103 MCG/ACT IN AERO: 2.0000 | INHALATION_SPRAY | RESPIRATORY_TRACT | Status: DC | PRN

## 2012-05-29 MED ORDER — ONDANSETRON HCL 4 MG PO TABS: 4.0000 mg | ORAL_TABLET | Freq: Three times a day (TID) | ORAL | Status: DC | PRN

## 2012-05-29 MED ORDER — ONDANSETRON HCL 4 MG/2ML IJ SOLN: 4.0000 mg | Freq: Four times a day (QID) | INTRAMUSCULAR | Status: DC | PRN

## 2012-05-29 MED ORDER — POTASSIUM CHLORIDE IN NACL 20-0.9 MEQ/L-% IV SOLN
INTRAVENOUS | Status: DC
  Administered 2012-05-29: 21:00:00 via INTRAVENOUS
  Filled 2012-05-29 (×3): qty 1000

## 2012-05-29 MED ORDER — SODIUM CHLORIDE 0.9 % IV SOLN: INTRAVENOUS | Status: DC

## 2012-05-29 MED ORDER — ACETAMINOPHEN 10 MG/ML IV SOLN
INTRAVENOUS | Status: DC | PRN
  Administered 2012-05-29: 1000 mg via INTRAVENOUS

## 2012-05-29 MED ORDER — SODIUM CHLORIDE 0.9 % IR SOLN
Status: DC | PRN
  Administered 2012-05-29: 14:00:00

## 2012-05-29 MED ORDER — AMLODIPINE BESYLATE 5 MG PO TABS
5.0000 mg | ORAL_TABLET | Freq: Every day | ORAL | Status: DC
  Administered 2012-05-30 – 2012-06-01 (×3): 5 mg via ORAL
  Filled 2012-05-29 (×3): qty 1

## 2012-05-29 MED ORDER — DEXTROSE 5 % IV SOLN
1.5000 g | INTRAVENOUS | Status: DC
  Filled 2012-05-29: qty 1.5

## 2012-05-29 MED ORDER — ACETAMINOPHEN 650 MG RE SUPP: 325.0000 mg | RECTAL | Status: DC | PRN

## 2012-05-29 MED ORDER — NEOSTIGMINE METHYLSULFATE 1 MG/ML IJ SOLN
INTRAMUSCULAR | Status: DC | PRN
  Administered 2012-05-29: 3 mg via INTRAVENOUS

## 2012-05-29 MED ORDER — IOHEXOL 300 MG/ML  SOLN
INTRAMUSCULAR | Status: DC | PRN
  Administered 2012-05-29: 30 mL via INTRAVENOUS

## 2012-05-29 MED ORDER — CEFAZOLIN SODIUM 1-5 GM-% IV SOLN
INTRAVENOUS | Status: DC | PRN
  Administered 2012-05-29: 1 g via INTRAVENOUS

## 2012-05-29 MED ORDER — PHENYLEPHRINE HCL 10 MG/ML IJ SOLN
20.0000 mg | INTRAVENOUS | Status: DC | PRN
  Administered 2012-05-29: 25 ug/min via INTRAVENOUS

## 2012-05-29 MED ORDER — DEXTROSE 5 % IV SOLN
INTRAVENOUS | Status: DC | PRN
  Administered 2012-05-29 (×2): via INTRAVENOUS

## 2012-05-29 MED ORDER — GUAIFENESIN-DM 100-10 MG/5ML PO SYRP: 15.0000 mL | ORAL_SOLUTION | ORAL | Status: DC | PRN

## 2012-05-29 MED ORDER — CEFAZOLIN SODIUM 1-5 GM-% IV SOLN
INTRAVENOUS | Status: AC
  Filled 2012-05-29: qty 50

## 2012-05-29 MED ORDER — METOPROLOL TARTRATE 25 MG PO TABS
25.0000 mg | ORAL_TABLET | Freq: Once | ORAL | Status: DC
  Filled 2012-05-29: qty 1

## 2012-05-29 MED ORDER — HYDROMORPHONE HCL PF 1 MG/ML IJ SOLN
0.2500 mg | INTRAMUSCULAR | Status: DC | PRN
  Administered 2012-05-29 (×6): 0.5 mg via INTRAVENOUS

## 2012-05-29 MED ORDER — DEXTROSE 5 % IV SOLN
1.5000 g | Freq: Two times a day (BID) | INTRAVENOUS | Status: AC
  Administered 2012-05-29 – 2012-05-30 (×2): 1.5 g via INTRAVENOUS
  Filled 2012-05-29 (×2): qty 1.5

## 2012-05-29 MED ORDER — LIDOCAINE HCL (CARDIAC) 20 MG/ML IV SOLN
INTRAVENOUS | Status: DC | PRN
  Administered 2012-05-29: 80 mg via INTRAVENOUS

## 2012-05-29 MED ORDER — MUPIROCIN 2 % EX OINT
TOPICAL_OINTMENT | CUTANEOUS | Status: AC
  Filled 2012-05-29: qty 22

## 2012-05-29 MED ORDER — INSULIN ASPART 100 UNIT/ML ~~LOC~~ SOLN
0.0000 [IU] | Freq: Three times a day (TID) | SUBCUTANEOUS | Status: DC
  Administered 2012-05-30 (×2): 3 [IU] via SUBCUTANEOUS
  Administered 2012-05-30: 2 [IU] via SUBCUTANEOUS
  Administered 2012-05-31: 5 [IU] via SUBCUTANEOUS
  Administered 2012-05-31: 3 [IU] via SUBCUTANEOUS
  Administered 2012-06-01 (×2): 2 [IU] via SUBCUTANEOUS

## 2012-05-29 MED ORDER — SUCCINYLCHOLINE CHLORIDE 20 MG/ML IJ SOLN
INTRAMUSCULAR | Status: DC | PRN
  Administered 2012-05-29: 100 mg via INTRAVENOUS

## 2012-05-29 MED ORDER — DROPERIDOL 2.5 MG/ML IJ SOLN: 0.6250 mg | INTRAMUSCULAR | Status: DC | PRN

## 2012-05-29 MED ORDER — OXYCODONE-ACETAMINOPHEN 5-325 MG PO TABS
1.0000 | ORAL_TABLET | ORAL | Status: DC | PRN
  Administered 2012-05-30: 2 via ORAL
  Administered 2012-05-30: 1 via ORAL
  Administered 2012-05-30 – 2012-05-31 (×7): 2 via ORAL
  Administered 2012-06-01: 1 via ORAL
  Administered 2012-06-01: 2 via ORAL
  Filled 2012-05-29 (×3): qty 2
  Filled 2012-05-29: qty 1
  Filled 2012-05-29: qty 2
  Filled 2012-05-29: qty 1
  Filled 2012-05-29 (×8): qty 2

## 2012-05-29 MED ORDER — ACETAMINOPHEN 325 MG PO TABS: 325.0000 mg | ORAL_TABLET | ORAL | Status: DC | PRN

## 2012-05-29 MED ORDER — HYDROMORPHONE HCL PF 1 MG/ML IJ SOLN
INTRAMUSCULAR | Status: AC
  Filled 2012-05-29: qty 1

## 2012-05-29 MED ORDER — IOHEXOL 300 MG/ML  SOLN
INTRAMUSCULAR | Status: AC
  Filled 2012-05-29: qty 1

## 2012-05-29 MED ORDER — HYDRALAZINE HCL 20 MG/ML IJ SOLN
10.0000 mg | INTRAMUSCULAR | Status: DC | PRN
  Filled 2012-05-29: qty 0.5

## 2012-05-29 MED ORDER — MORPHINE SULFATE 2 MG/ML IJ SOLN
2.0000 mg | INTRAMUSCULAR | Status: DC | PRN
  Administered 2012-05-29: 2 mg via INTRAVENOUS
  Administered 2012-05-30 (×2): 4 mg via INTRAVENOUS
  Administered 2012-05-30 (×3): 2 mg via INTRAVENOUS
  Filled 2012-05-29: qty 1
  Filled 2012-05-29: qty 2
  Filled 2012-05-29 (×3): qty 1
  Filled 2012-05-29: qty 2

## 2012-05-29 MED ORDER — PHENOL 1.4 % MT LIQD: 1.0000 | OROMUCOSAL | Status: DC | PRN

## 2012-05-29 MED ORDER — INSULIN GLARGINE 100 UNIT/ML ~~LOC~~ SOLN
30.0000 [IU] | Freq: Every day | SUBCUTANEOUS | Status: DC
  Administered 2012-05-29 – 2012-05-31 (×3): 30 [IU] via SUBCUTANEOUS

## 2012-05-29 MED ORDER — METOPROLOL TARTRATE 12.5 MG HALF TABLET
ORAL_TABLET | ORAL | Status: AC
  Filled 2012-05-29: qty 2

## 2012-05-29 MED ORDER — GLIMEPIRIDE 4 MG PO TABS
4.0000 mg | ORAL_TABLET | Freq: Every day | ORAL | Status: DC
  Administered 2012-05-30 – 2012-06-01 (×3): 4 mg via ORAL
  Filled 2012-05-29 (×4): qty 1

## 2012-05-29 MED ORDER — LACTATED RINGERS IV SOLN
INTRAVENOUS | Status: DC | PRN
  Administered 2012-05-29 (×2): via INTRAVENOUS

## 2012-05-29 MED ORDER — HEPARIN SODIUM (PORCINE) 1000 UNIT/ML IJ SOLN
INTRAMUSCULAR | Status: DC | PRN
  Administered 2012-05-29: 6000 [IU] via INTRAVENOUS

## 2012-05-29 MED ORDER — FENTANYL CITRATE 0.05 MG/ML IJ SOLN
INTRAMUSCULAR | Status: DC | PRN
  Administered 2012-05-29 (×3): 50 ug via INTRAVENOUS
  Administered 2012-05-29: 100 ug via INTRAVENOUS

## 2012-05-29 MED ORDER — DOCUSATE SODIUM 100 MG PO CAPS
100.0000 mg | ORAL_CAPSULE | Freq: Every day | ORAL | Status: DC
  Administered 2012-05-30 – 2012-06-01 (×3): 100 mg via ORAL
  Filled 2012-05-29 (×3): qty 1

## 2012-05-29 MED ORDER — 0.9 % SODIUM CHLORIDE (POUR BTL) OPTIME
TOPICAL | Status: DC | PRN
  Administered 2012-05-29 (×2): 1000 mL

## 2012-05-29 MED ORDER — NITROGLYCERIN 0.4 MG SL SUBL: 0.4000 mg | SUBLINGUAL_TABLET | SUBLINGUAL | Status: DC | PRN

## 2012-05-29 MED ORDER — LEVETIRACETAM 250 MG PO TABS
250.0000 mg | ORAL_TABLET | Freq: Two times a day (BID) | ORAL | Status: DC
  Administered 2012-05-29 – 2012-06-01 (×6): 250 mg via ORAL
  Filled 2012-05-29 (×7): qty 1

## 2012-05-29 MED ORDER — METOPROLOL TARTRATE 25 MG PO TABS
25.0000 mg | ORAL_TABLET | Freq: Two times a day (BID) | ORAL | Status: DC
  Administered 2012-05-29 – 2012-06-01 (×6): 25 mg via ORAL
  Filled 2012-05-29 (×7): qty 1

## 2012-05-29 MED ORDER — FUROSEMIDE 40 MG PO TABS
40.0000 mg | ORAL_TABLET | Freq: Every day | ORAL | Status: DC
  Administered 2012-05-30 – 2012-06-01 (×3): 40 mg via ORAL
  Filled 2012-05-29 (×3): qty 1

## 2012-05-29 MED ORDER — CLOPIDOGREL BISULFATE 75 MG PO TABS
75.0000 mg | ORAL_TABLET | Freq: Every day | ORAL | Status: DC
Start: 2012-05-30 — End: 2012-06-01
  Administered 2012-05-30 – 2012-06-01 (×3): 75 mg via ORAL
  Filled 2012-05-29 (×3): qty 1

## 2012-05-29 MED ORDER — ACETAMINOPHEN 10 MG/ML IV SOLN
INTRAVENOUS | Status: AC
  Filled 2012-05-29: qty 100

## 2012-05-29 MED ORDER — ALUM & MAG HYDROXIDE-SIMETH 200-200-20 MG/5ML PO SUSP: 15.0000 mL | ORAL | Status: DC | PRN

## 2012-05-29 MED ORDER — DOPAMINE-DEXTROSE 3.2-5 MG/ML-% IV SOLN: 3.0000 ug/kg/min | INTRAVENOUS | Status: DC

## 2012-05-29 MED ORDER — LABETALOL HCL 5 MG/ML IV SOLN
10.0000 mg | INTRAVENOUS | Status: DC | PRN
  Filled 2012-05-29: qty 4

## 2012-05-29 MED ORDER — ENOXAPARIN SODIUM 40 MG/0.4ML ~~LOC~~ SOLN
40.0000 mg | SUBCUTANEOUS | Status: DC
  Administered 2012-05-30: 40 mg via SUBCUTANEOUS
  Filled 2012-05-29 (×3): qty 0.4

## 2012-05-29 MED ORDER — ROCURONIUM BROMIDE 100 MG/10ML IV SOLN
INTRAVENOUS | Status: DC | PRN
  Administered 2012-05-29: 50 mg via INTRAVENOUS
  Administered 2012-05-29: 20 mg via INTRAVENOUS

## 2012-05-29 MED ORDER — METOPROLOL TARTRATE 1 MG/ML IV SOLN: 2.0000 mg | INTRAVENOUS | Status: DC | PRN

## 2012-05-29 MED ORDER — PROTAMINE SULFATE 10 MG/ML IV SOLN
INTRAVENOUS | Status: DC | PRN
  Administered 2012-05-29: 10 mg via INTRAVENOUS
  Administered 2012-05-29 (×2): 20 mg via INTRAVENOUS

## 2012-05-29 MED ORDER — INSULIN ASPART 100 UNIT/ML ~~LOC~~ SOLN
0.0000 [IU] | Freq: Every day | SUBCUTANEOUS | Status: DC
  Administered 2012-05-30 – 2012-05-31 (×2): 3 [IU] via SUBCUTANEOUS

## 2012-05-29 MED ORDER — MIDAZOLAM HCL 5 MG/5ML IJ SOLN
INTRAMUSCULAR | Status: DC | PRN
  Administered 2012-05-29: 1 mg via INTRAVENOUS

## 2012-05-29 MED ORDER — ASPIRIN EC 81 MG PO TBEC
81.0000 mg | DELAYED_RELEASE_TABLET | Freq: Every day | ORAL | Status: DC
  Administered 2012-05-30 – 2012-06-01 (×3): 81 mg via ORAL
  Filled 2012-05-29 (×3): qty 1

## 2012-05-29 SURGICAL SUPPLY — 54 items
ADH SKN CLS APL DERMABOND .7 (GAUZE/BANDAGES/DRESSINGS) ×6
BANDAGE ESMARK 6X9 LF (GAUZE/BANDAGES/DRESSINGS) IMPLANT
BNDG CMPR 9X6 STRL LF SNTH (GAUZE/BANDAGES/DRESSINGS)
BNDG ESMARK 6X9 LF (GAUZE/BANDAGES/DRESSINGS)
BOOT SUTURE AID YELLOW STND (SUTURE) IMPLANT
CANISTER SUCTION 2500CC (MISCELLANEOUS) ×3 IMPLANT
CLIP TI MEDIUM 24 (CLIP) ×3 IMPLANT
CLIP TI WIDE RED SMALL 24 (CLIP) ×4 IMPLANT
CLOTH BEACON ORANGE TIMEOUT ST (SAFETY) ×3 IMPLANT
COVER SURGICAL LIGHT HANDLE (MISCELLANEOUS) ×3 IMPLANT
DECANTER SPIKE VIAL GLASS SM (MISCELLANEOUS) IMPLANT
DERMABOND ADVANCED (GAUZE/BANDAGES/DRESSINGS) ×3
DERMABOND ADVANCED .7 DNX12 (GAUZE/BANDAGES/DRESSINGS) ×2 IMPLANT
DRAIN SNY 10X20 3/4 PERF (WOUND CARE) IMPLANT
DRAPE WARM FLUID 44X44 (DRAPE) ×3 IMPLANT
DRAPE X-RAY CASS 24X20 (DRAPES) IMPLANT
ELECT REM PT RETURN 9FT ADLT (ELECTROSURGICAL) ×3
ELECTRODE REM PT RTRN 9FT ADLT (ELECTROSURGICAL) ×2 IMPLANT
EVACUATOR SILICONE 100CC (DRAIN) IMPLANT
GLOVE BIOGEL PI IND STRL 6.5 (GLOVE) IMPLANT
GLOVE BIOGEL PI IND STRL 7.5 (GLOVE) IMPLANT
GLOVE BIOGEL PI INDICATOR 6.5 (GLOVE) ×3
GLOVE BIOGEL PI INDICATOR 7.5 (GLOVE) ×1
GLOVE SS BIOGEL STRL SZ 7 (GLOVE) ×2 IMPLANT
GLOVE SUPERSENSE BIOGEL SZ 7 (GLOVE) ×1
GLOVE SURG SS PI 6.0 STRL IVOR (GLOVE) ×1 IMPLANT
GLOVE SURG SS PI 7.5 STRL IVOR (GLOVE) ×1 IMPLANT
GOWN STRL NON-REIN LRG LVL3 (GOWN DISPOSABLE) ×6 IMPLANT
INSERT FOGARTY SM (MISCELLANEOUS) ×3 IMPLANT
KIT BASIN OR (CUSTOM PROCEDURE TRAY) ×3 IMPLANT
KIT ROOM TURNOVER OR (KITS) ×3 IMPLANT
NS IRRIG 1000ML POUR BTL (IV SOLUTION) ×6 IMPLANT
PACK PERIPHERAL VASCULAR (CUSTOM PROCEDURE TRAY) ×3 IMPLANT
PAD ARMBOARD 7.5X6 YLW CONV (MISCELLANEOUS) ×6 IMPLANT
PADDING CAST COTTON 6X4 STRL (CAST SUPPLIES) IMPLANT
SET COLLECT BLD 21X3/4 12 (NEEDLE) IMPLANT
SPONGE LAP 18X18 X RAY DECT (DISPOSABLE) ×1 IMPLANT
STOPCOCK 4 WAY LG BORE MALE ST (IV SETS) IMPLANT
SUT PROLENE 6 0 BV (SUTURE) IMPLANT
SUT PROLENE 6 0 CC (SUTURE) ×8 IMPLANT
SUT PROLENE 7 0 BV 1 (SUTURE) IMPLANT
SUT PROLENE 7 0 BV1 MDA (SUTURE) ×1 IMPLANT
SUT SILK 2 0 SH (SUTURE) ×3 IMPLANT
SUT SILK 3 0 (SUTURE)
SUT SILK 3-0 18XBRD TIE 12 (SUTURE) IMPLANT
SUT VIC AB 2-0 CTX 36 (SUTURE) ×6 IMPLANT
SUT VIC AB 3-0 SH 27 (SUTURE) ×9
SUT VIC AB 3-0 SH 27X BRD (SUTURE) ×4 IMPLANT
TOWEL OR 17X24 6PK STRL BLUE (TOWEL DISPOSABLE) ×6 IMPLANT
TOWEL OR 17X26 10 PK STRL BLUE (TOWEL DISPOSABLE) ×6 IMPLANT
TRAY FOLEY CATH 14FRSI W/METER (CATHETERS) ×4 IMPLANT
TUBING EXTENTION W/L.L. (IV SETS) IMPLANT
UNDERPAD 30X30 INCONTINENT (UNDERPADS AND DIAPERS) ×3 IMPLANT
WATER STERILE IRR 1000ML POUR (IV SOLUTION) ×3 IMPLANT

## 2012-05-29 NOTE — Progress Notes (Signed)
Pt complained someing in my eye , explained ointment was put in for the surgery carefully wiped ou with a damp washcloth and patient stated it felt better . Not red and pt not touching eyes.

## 2012-05-29 NOTE — Anesthesia Postprocedure Evaluation (Signed)
Anesthesia Post Note  Patient: [name]  Procedure(s) Performed: Procedure(s) (LRB): BYPASS GRAFT FEMORAL-POPLITEAL ARTERY (Right) INTRA OPERATIVE ARTERIOGRAM (Right)  Anesthesia type: general  Patient location: PACU  Post pain: Pain level controlled  Post assessment: Patient's Cardiovascular Status Stable  Last Vitals:  Filed Vitals:   05/29/12 1800  BP: 120/68  Pulse: 76  Temp:   Resp: 13    Post vital signs: Reviewed and stable  Level of consciousness: sedated  Complications: No apparent anesthesia complications

## 2012-05-29 NOTE — Transfer of Care (Signed)
Immediate Anesthesia Transfer of Care Note  Patient: [name]  Procedure(s) Performed: Procedure(s) (LRB): BYPASS GRAFT FEMORAL-POPLITEAL ARTERY (Right) INTRA OPERATIVE ARTERIOGRAM (Right)  Patient Location: PACU  Anesthesia Type: General  Level of Consciousness: awake, alert , oriented and patient cooperative  Airway & Oxygen Therapy: Patient Spontanous Breathing and Patient connected to nasal cannula oxygen  Post-op Assessment: Report given to PACU RN, Post -op Vital signs reviewed and stable and Patient moving all extremities  Post vital signs: Reviewed and stable  Complications: No apparent anesthesia complications

## 2012-05-29 NOTE — Anesthesia Procedure Notes (Signed)
Procedure Name: Intubation Date/Time: 05/29/2012 1:57 PM Performed by: Tyrone Nine Pre-anesthesia Checklist: Patient being monitored, Suction available, Emergency Drugs available, Patient identified and Timeout performed Patient Re-evaluated:Patient Re-evaluated prior to inductionOxygen Delivery Method: Circle system utilized Preoxygenation: Pre-oxygenation with 100% oxygen Intubation Type: IV induction Ventilation: Mask ventilation without difficulty Tube type: Oral Tube size: 7.5 mm Number of attempts: 1 Airway Equipment and Method: Stylet Placement Confirmation: ETT inserted through vocal cords under direct vision,  breath sounds checked- equal and bilateral and positive ETCO2 Secured at: 22 cm Tube secured with: Tape Dental Injury: Teeth and Oropharynx as per pre-operative assessment

## 2012-05-29 NOTE — Anesthesia Preprocedure Evaluation (Addendum)
Anesthesia Evaluation  Patient identified by MRN, date of birth, ID band Patient awake    Reviewed: Allergy & Precautions, H&P , NPO status , Patient's Chart, lab work & pertinent test results  Airway Mallampati: I TM Distance: >3 FB Neck ROM: Full    Dental  (+) Edentulous Upper, Edentulous Lower and Dental Advisory Given   Pulmonary shortness of breath and with exertion, sleep apnea , COPD COPD inhaler,    Pulmonary exam normal       Cardiovascular Exercise Tolerance: Poor hypertension, Pt. on home beta blockers with exertion + CAD, + Past MI and +CHF     Neuro/Psych Anxiety CVA, Residual Symptoms    GI/Hepatic negative GI ROS, Neg liver ROS, GERD-  Controlled,  Endo/Other  Poorly Controlled, Type obesityGlucose 160 @ 1:36 pm  Renal/GU   negative genitourinary   Musculoskeletal  (+) Arthritis -, Osteoarthritis,    Abdominal   Peds  Hematology negative hematology ROS (+)   Anesthesia Other Findings   Reproductive/Obstetrics negative OB ROS                        Anesthesia Physical Anesthesia Plan  ASA: III  Anesthesia Plan: General   Post-op Pain Management:    Induction: Intravenous  Airway Management Planned: Oral ETT  Additional Equipment:   Intra-op Plan:   Post-operative Plan: Extubation in OR  Informed Consent: I have reviewed the patients History and Physical, chart, labs and discussed the procedure including the risks, benefits and alternatives for the proposed anesthesia with the patient or authorized representative who has indicated his/her understanding and acceptance.   Dental advisory given  Plan Discussed with: CRNA, Anesthesiologist and Surgeon  Anesthesia Plan Comments:         Anesthesia Quick Evaluation

## 2012-05-29 NOTE — Progress Notes (Signed)
Pt complained again of right eye feeling like something is in it. Dr. Noreene Larsson called examined patient . No reddness no drainage or tearing. He told her it could be a small scratch which usually clears in about 12 hours . But if no different to in A M to tell the physician  and they could get an eye doctor to examine her right eye.

## 2012-05-29 NOTE — Preoperative (Addendum)
Beta Blockers  Pt took Lopressor on  05/29/12     @ 12:56

## 2012-05-29 NOTE — Op Note (Signed)
OPERATIVE REPORT  Date of Surgery: 05/29/2012  Surgeon: Josephina Gip, MD  Assistant: Lianne Cure PA  Pre-op Diagnosis: Ischemic right leg secondary to superficial femoral and tibial occlusive disease  Post-op Diagnosis: Same  Procedure: Procedure(s): BYPASS GRAFT FEMORAL-POPLITEAL ARTERY-right using non-reversed translocated saphenous vein graft from right leg  INTRA OPERATIVE ARTERIOGRAM  Anesthesia: General  EBL: 200 cc  Complications: None  Procedure Details: Patient taken to the operating room placed in supine position at which time satisfactory general endotracheal anesthesia was measured. The right leg was prepped with Betadine scrub and solution draped in routine sterile manner. Saphenous vein was exposed at the saphenofemoral junction through a longitudinal incision. He was then dissected free down to the knee level through 3 incisions on the medial aspect of the right leg. It was very nice for about 6-8 cm and then bifurcated and became marginal but adequate. Common superficial and profunda femoris arteries were dissected free in the inguinal incision well up beneath the inguinal ligament. There was good pulse in the common femoral artery superficial femoral artery was diffusely diseased. Popliteal arteries and exposed in the above-knee position where was mildly and diffusely diseased but widely patent on angiogram. Subfascial anatomic tunnel was created and the patient was heparinized. Femoral vessels were occluded with vascular clamps longitudinal opening made in the common femoral artery 15 blade and extended with Potts scissors. There was excellent inflow. Proximal end of the vein was spatulated and anastomosed end to side with 6-0 Prolene. Clamps released there was good pulse and the first set of competent valves. Using a retrograde valvulotome the valves were rendered incompetent with resultant excellent flow out the distal and vein graft. Plane was carefully delivered through  the tunnel popliteal artery was occluded proximally and distally with vessel loops. It was opened with 15 blade and extended with Potts scissors. It would easily accept 3 and half millimeter dilator distally and had good backbleeding. He did have mild to moderate disease however. The vein was carefully measured spatulated and anastomosed end-to-side with 6-0 Prolene. Clamps were then released and there was a good pulse in the vein graft and excellent Doppler flow in the vein graft as well as the anterior tibial artery at the ankle. Intraoperative arteriogram revealed widely patent anastomosis with a marginal size 9 two-vessel runoff. There was a high origin of the anterior tibial artery at the knee joint level of the vessel being the peroneal artery. Adequate hemostasis was achieved following administration of protamine the wounds were closed in layers of Vicryl in subcuticular fashion dressing applied patient to recovery room stable condition   Josephina Gip, MD 05/29/2012 5:35 PM

## 2012-05-29 NOTE — Progress Notes (Signed)
Dr singer pager...the patient ate breakfast @ 730 this am. Also reported this to [facility] in holding.

## 2012-05-29 NOTE — Progress Notes (Signed)
[facility] [place] HISTORY AND PHYSICAL -PAD CC: Increased right lower extremity pain Referring Physician: Fields  History of Present Illness  [name] is a 61 y.o. female patient of Dr. Darrick Penna with known severe peripheral arterial disease bilaterally. The patient did have a aortogram with bilateral extremity runoff with Dr. Darrick Penna on July 5. She was offered a right femoral to above-the-knee popliteal bypass and did not make a decision immediately however she did call back and schedule this for the end of July. The patient called yesterday asking to be seen today for increased right lower extremity pain and was brought in for evaluation.     Past Medical History  Diagnosis Date  . GERD (gastroesophageal reflux disease)   . PAD (peripheral artery disease)     Stent, embolectomy, surgery  /     Dr. Darrick Penna, ABI 0.52 right, 0.54 left, February, 2010  . COPD (chronic obstructive pulmonary disease)     Dr. Craige Cotta, medications, PFTs August, 2012  . OSA (obstructive sleep apnea)     Dr. Craige Cotta  CPAP started August, 2012  . Systolic and diastolic CHF, chronic   . CAD (coronary artery disease)     June, 2012, DES to RCA, staged DES to circumflex and DES to diagonal with pressure wire to LAD, FFR 0.84  . DM (diabetes mellitus), type 2   . Hypertension   . Morbid obesity   . Hyperlipidemia   . Ejection fraction < 50%     EF 45-50%, echo, June, 2012  . Diastolic dysfunction     grade 2, echo, June, 2012  . Peripheral neuropathy   . Tobacco abuse   . Syncope     vasovagal  . Breast cancer     lumpectomy and radiation 2002  . MI, old   . Myocardial infarction 10/19/11  . DVT (deep venous thrombosis)     Social History History  Substance Use Topics  . Smoking status: Former Smoker -- 1.5 packs/day for 44 years    Types: Cigarettes    Quit date: 11/30/2010  . Smokeless tobacco: Never Used  . Alcohol Use: No    Family History Family History  Problem  Relation Age of Onset  . Hypertension Sister   . Diabetes Sister   . Hyperlipidemia Sister   . Hyperlipidemia Sister   . Hyperlipidemia Brother   . Diabetes Brother   . Lung cancer Father   . Cancer Father   . Heart disease Father   . Hypertension Father   . Stomach cancer Sister   . Diabetes Daughter     ROS: [x]  Positive   [ ]  Denies  General:[ ]  Weight loss,  [ ]  Weight gain, [ ]  Fever, [ ]  chills Neurologic: [ ]  Dizziness, [ ]  Blackouts, [ ]  Seizure [ ]  Stroke, [ ]  "Mini stroke", [ ]  Slurred speech, [ ]  Temporary blindness;  [ ] weakness, [ ]  Hoarseness Cardiac: [ ]  Chest pain/pressure, [ ]  Shortness of breath at rest [ ]  Shortness of breath with exertion,  [ ]   Atrial fibrillation or irregular heartbeat Vascular:[ ]  Pain in legs with walking, [ ]  Pain in legs at rest ,[ ]  Pain in legs at night,  [ ]   Non-healing ulcer, [ ]  Blood clot in vein/DVT,   Pulmonary: [ ]  Home oxygen, [ ]   Productive cough, [ ]  Coughing up blood,  [ ]  Asthma,  [ ]  Wheezing Musculoskeletal:  [ ]  Arthritis, [ ]  Low back pain,  [ ]   Joint pain Hematologic:[ ]  Easy Bruising, [ ]  Anemia; [ ]  Hepatitis Gastrointestinal: [ ]  Blood in stool,  [ ]  Gastroesophageal Reflux, [ ]  Trouble swallowing Urinary: [ ]  chronic Kidney disease, [ ]  on HD, [ ]  Burning with urination, [ ]  Frequent urination, [ ]  Difficulty urinating;  Skin: [ ]  Rashes, [ ]  Wounds    No Known Allergies  Current Outpatient Prescriptions  Medication Sig Dispense Refill  . albuterol-ipratropium (COMBIVENT) 18-103 MCG/ACT inhaler Inhale 2 puffs into the lungs every 3 (three) hours as needed. For shortness of breath.      Marland Kitchen amLODipine (NORVASC) 5 MG tablet Take 5 mg by mouth daily.       Marland Kitchen aspirin EC 81 MG tablet Take 81 mg by mouth daily.      . clopidogrel (PLAVIX) 75 MG tablet Take 1 tablet (75 mg total) by mouth daily.  30 tablet  1  . furosemide (LASIX) 40 MG tablet Take 40 mg by mouth daily.        Marland Kitchen glimepiride (AMARYL) 4 MG tablet Take 1  tablet (4 mg total) by mouth daily with breakfast.  30 tablet  1  . insulin glargine (LANTUS) 100 UNIT/ML injection Inject 30-45 Units into the skin at bedtime. 45 units at night and 30 in the morning      . insulin regular (NOVOLIN R,HUMULIN R) 100 units/mL injection Inject into the skin 4 (four) times daily as needed. Per sliding scale.      . levETIRAcetam (KEPPRA) 250 MG tablet Take 250 mg by mouth every 12 (twelve) hours.      . metoCLOPramide (REGLAN) 5 MG tablet Take 5 mg by mouth 3 (three) times daily.       . metoprolol tartrate (LOPRESSOR) 25 MG tablet Take 25 mg by mouth 2 (two) times daily.       . nitroGLYCERIN (NITROSTAT) 0.4 MG SL tablet Place 1 tablet (0.4 mg total) under the tongue every 5 (five) minutes as needed for chest pain.  25 tablet  3  . potassium chloride SA (K-DUR,KLOR-CON) 20 MEQ tablet Take 1 tablet (20 mEq total) by mouth daily at 12 noon.  30 tablet  1  . rosuvastatin (CRESTOR) 40 MG tablet Take 40 mg by mouth daily.        . clindamycin (CLEOCIN) 150 MG capsule Take 150 mg by mouth daily as needed. When patient feels that cellulitis in foot is going to flare up.       . ondansetron (ZOFRAN) 4 MG tablet Take 4 mg by mouth every 8 (eight) hours as needed. Nausea       . oxyCODONE-acetaminophen (PERCOCET) 5-325 MG per tablet Take 1 tablet by mouth every 4 (four) hours as needed for pain.  15 tablet  0  . pantoprazole (PROTONIX) 40 MG tablet Take 40 mg by mouth daily.          Physical Examination  Filed Vitals:   05/29/12 1117  BP: 139/71  Pulse: 80  Temp: 98 F (36.7 C)  Resp: 18    General: A&O x 3, WDWN,  Gait: The patient is in a wheelchair due to the extreme lower extremity pain Eyes: PERRLA, Pulmonary: CTAB, without wheezes , rales or rhonchi Cardiac: regular Rythm , without murmur  RIGHT                                       LEFT  CAROTID BRUIT Negative Negative  VASCULAR  EXAM: Extremities with ischemic changes right lower extremity is cooler than the left although there is no cyanosis there is a notable temperature difference.   LOWER EXTREMITY PULSES           RIGHT                                      LEFT      FEMORAL  weak and palpable  weak and palpable        POPLITEAL  absent   absent        POSTERIOR TIBIAL  absent   absent         DORSALIS PEDIS      ANTERIOR TIBIAL present and palpable   present and palpable        PERONEAL  not palpated    not palpated    Abdomen: soft, NT, no masses Skin: no rashes, ulcers noted Musculoskeletal: no muscle wasting or atrophy  Neurologic: A&O X 3; Appropriate Affect ; SENSATION: normal; MOTOR FUNCTION:  moving all extremities equally. Speech is fluent/normal    Non-Invasive Vascular Imaging: DATE: 05/29/2012 No imaging today aortogram from July 50,013 is available for review      ASSESSMENT/PLAN: GIANA CASTNER is a 61 y.o. female who presents with: Bilateral lower extremity pain right greater than left who is scheduled for a right femoral to above-the-knee popliteal bypass at the end of July with Dr. Darrick Penna however she states she cannot wait that long, Dr. Hart Rochester was consulted who is on call today and has requested the patient be sent to the short stay Center at [facility] to be admitted for a emergent right lower extremity bypass graft. The patient and her family state understanding, they are in agreement with this plan.  Lauree Chandler ANP   Clinic MD: Imogene Burn

## 2012-05-29 NOTE — H&P (View-Only) (Signed)
Patient is a 61-year-old female with known severe peripheral arterial disease bilaterally. She has had a 2-3 week  history of pain in her right first toe.  The pain is now present in the entire forefoot She states that the pain gets worse if there is a thunderstorm in the area. She has no open wounds on the foot. She has a history of an ABI less than 0.5 bilaterally. She states that she is ambulatory with her normal amount of claudication. She is currently taking Percocet but still has pain in the foot. She states also she feels like the foot has a heavy sensation.   Review of systems: She denies shortness of breath. She denies chest pain.  Physcal exam:  Filed Vitals:   05/07/12 1510  BP: 136/74  Pulse: 88  Resp: 20  Height: 5' 3" (1.6 m)  Weight: 220 lb (99.791 kg)  Right lower extremity: One plus right femoral pulse absent popliteal and pedal pulses right foot is pink and warm there is no open ulceration capillary refill is brisk   Assessment: Right first toe pain in a patient with known severe peripheral arterial disease. This currently seems more arthritic in nature than rest pain from ischemia.  Patient was given prescriptions for Percocet today #40 dispensed. Unfortunately the pain in her right foot has not resolved. This is probably multifactorial but due to the fact that she has decreased ABIs on that side I believe she needs an arteriogram. We have scheduled this for July 27. Risks benefits possible complications and procedure details were trying to the patient today.  Plan: See above  Lindell Tussey, MD [facility] [place] Office: [phone] Pager: [phone]   

## 2012-05-29 NOTE — Progress Notes (Signed)
Right foot warm and dry wiggles toes pulses doppled feels sensation

## 2012-05-29 NOTE — Progress Notes (Signed)
Dr. Noreene Larsson called for more pain, med he came and saw pt , and she may have another 1 mg of dilaudid befor going to floor

## 2012-05-29 NOTE — Interval H&P Note (Signed)
History and Physical Interval Note:  05/29/2012 1:34 PM  [name]  has presented today for surgery, with the diagnosis of cold leg  The various methods of treatment have been discussed with the patient and family. After consideration of risks, benefits and other options for treatment, the patient has consented to  Procedure(s) :right BYPASS GRAFT FEMORAL-POPLITEAL ARTERY (N/A) as a surgical intervention .  The patient's history has been reviewed, patient examined, no change in status, stable for surgery.  I have reviewed the patient's chart and labs.  Questions were answered to the patient's satisfaction.     Josephina Gip

## 2012-05-30 LAB — GLUCOSE, CAPILLARY
Glucose-Capillary: 136 mg/dL — ABNORMAL HIGH (ref 70–99)
Glucose-Capillary: 179 mg/dL — ABNORMAL HIGH (ref 70–99)
Glucose-Capillary: 191 mg/dL — ABNORMAL HIGH (ref 70–99)
Glucose-Capillary: 293 mg/dL — ABNORMAL HIGH (ref 70–99)

## 2012-05-30 LAB — BASIC METABOLIC PANEL
BUN: 17 mg/dL (ref 6–23)
CO2: 23 mEq/L (ref 19–32)
Calcium: 8.7 mg/dL (ref 8.4–10.5)
Chloride: 105 mEq/L (ref 96–112)
Creatinine, Ser: 0.88 mg/dL (ref 0.50–1.10)
GFR calc Af Amer: 81 mL/min — ABNORMAL LOW (ref >90)
GFR calc non Af Amer: 69 mL/min — ABNORMAL LOW (ref >90)
Glucose, Bld: 125 mg/dL — ABNORMAL HIGH (ref 70–99)
Potassium: 4.4 mEq/L (ref 3.5–5.1)
Sodium: 139 mEq/L (ref 135–145)

## 2012-05-30 LAB — CBC
HCT: 33.9 % — ABNORMAL LOW (ref 36.0–46.0)
Hemoglobin: 11.1 g/dL — ABNORMAL LOW (ref 12.0–15.0)
MCH: 28.3 pg (ref 26.0–34.0)
MCHC: 32.7 g/dL (ref 30.0–36.0)
MCV: 86.5 fL (ref 78.0–100.0)
Platelets: 216 10*3/uL (ref 150–400)
RBC: 3.92 MIL/uL (ref 3.87–5.11)
RDW: 12.2 % (ref 11.5–15.5)
WBC: 10.8 10*3/uL — ABNORMAL HIGH (ref 4.0–10.5)

## 2012-05-30 LAB — HEMOGLOBIN A1C: Hgb A1c MFr Bld: 8.9 % — ABNORMAL HIGH (ref <5.7)

## 2012-05-30 NOTE — Progress Notes (Signed)
Pt tx 2000. Per MD order, pt VSS, pt verbalized understanding of tx, report caleed to txing RN, all questions answered

## 2012-05-30 NOTE — Evaluation (Signed)
Physical Therapy Evaluation Patient Details Name: [name] MRN: [id number] DOB: [birth year] Today's Date: 05/30/2012 Time: 1478-2956 PT Time Calculation (min): 22 min  PT Assessment / Plan / Recommendation Clinical Impression  Patient is S/P urgent right femoral popliteal bypass graft. She has had a decline in her functional mobility for the last 1-2 weeks resulting in an inability to ambulate secondary to right leg pain. We will continue to follow patient to progress her functional mobility so as to decrease the burden of care at discharge.     PT Assessment  Patient needs continued PT services    Follow Up Recommendations  Home health PT;Supervision/Assistance - 24 hour    Barriers to Discharge  None      Equipment Recommendations  None recommended by PT    Recommendations for Other Services  None   Frequency Min 3X/week    Precautions / Restrictions Precautions Precautions: Fall Restrictions Weight Bearing Restrictions: No   Pertinent Vitals/Pain Limited by right leg pain      Mobility  Bed Mobility Bed Mobility: Sit to Supine Sit to Supine: 4: Min assist Details for Bed Mobility Assistance: Assistance to raise legs only.  Transfers Transfers: Sit to Stand;Stand to [employer] Transfers Sit to Stand: 3: Mod assist;With upper extremity assist;From chair/3-in-1 Stand to Sit: 4: Min assist;With upper extremity assist;To bed Stand Pivot Transfers: 3: Mod assist Details for Transfer Assistance: Patient with difficulty weight bearing on right leg and with transition to the chair she has difficulty taking pivotal steps due to decreased right stance secondary to pain.     Exercises General Exercises - Lower Extremity Ankle Circles/Pumps: AROM;Both;10 reps;Supine Quad Sets: AROM;Both;5 reps;Supine   PT Diagnosis: Difficulty walking;Acute pain  PT Problem List: Decreased strength;Decreased range of motion;Decreased activity tolerance;Decreased balance;Decreased  mobility;Pain;Decreased knowledge of use of DME PT Treatment Interventions: DME instruction;Gait training;Stair training;Therapeutic activities;Therapeutic exercise;Balance training;Patient/family education   PT Goals Acute Rehab PT Goals PT Goal Formulation: With patient Time For Goal Achievement: 06/06/12 Potential to Achieve Goals: Good Pt will go Supine/Side to Sit: with supervision PT Goal: Supine/Side to Sit - Progress: Goal set today Pt will go Sit to Supine/Side: with supervision PT Goal: Sit to Supine/Side - Progress: Goal set today Pt will go Sit to Stand: with supervision;with upper extremity assist PT Goal: Sit to Stand - Progress: Goal set today Pt will go Stand to Sit: with supervision;with upper extremity assist PT Goal: Stand to Sit - Progress: Goal set today Pt will Transfer Bed to Chair/Chair to Bed: with min assist PT Transfer Goal: Bed to Chair/Chair to Bed - Progress: Goal set today Pt will Ambulate: 16 - 50 feet;with min assist;with least restrictive assistive device PT Goal: Ambulate - Progress: Goal set today Pt will Go Up / Down Stairs: 1-2 stairs;with rail(s);with min assist PT Goal: Up/Down Stairs - Progress: Goal set today  Visit Information  Last PT Received On: 05/30/12 Assistance Needed: +2    Subjective Data  Subjective: Patient states she has not ambulated in a week or two.  Patient Stated Goal: Home with help of her family.    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 Reports for the last week or two she has not ambulated at all. Her son has helped her up and down the stairs in a wheelchair.  Communication Communication: No difficulties    Cognition  Overall Cognitive Status: Appears within functional limits for tasks assessed/performed Arousal/Alertness: Awake/alert Orientation Level: Appears intact for tasks assessed Behavior During Session: [facility] for tasks performed    Extremity/Trunk Assessment Right Lower Extremity Assessment RLE ROM/Strength/Tone: Deficits;Due to pain RLE ROM/Strength/Tone Deficits: Also deficits from old stroke. Patient states PTA SLR only 1 inch from the bed and chronic leg external rotation. Fair quad set. Knee flexion with assistance limited to 40 degrees. Ankle dorsiflexion/plantarflexion WNL. Left Lower Extremity Assessment LLE ROM/Strength/Tone: WFL for tasks assessed LLE Sensation: WFL - Light Touch;WFL - Proprioception LLE Coordination: WFL - gross/fine motor   Balance Balance Balance Assessed: Yes Static Standing Balance Static Standing - Balance Support: No upper extremity supported Static Standing - Level of Assistance: 3: Mod assist Static Standing - Comment/# of Minutes: Assistance to stand secondary to pain and recent decline in function resulting in weakness.   End of Session PT - End of Session Equipment Utilized During Treatment: Gait belt Activity Tolerance: Patient limited by pain Patient left: in bed;with call bell/phone within reach Nurse Communication: Mobility status   Edwyna Perfect, PT  Pager [phone]  05/30/2012, 9:34 AM

## 2012-05-30 NOTE — Progress Notes (Signed)
Patient ID: [name], female   DOB: 1951/07/10, 61 y.o.   MRN: [id number] Vascular Surgery Progress Note  Subjective: One day post urgent right femoral popliteal bypass graft for worsening pain in right foot-patient states foot feels much better today  Objective:  Filed Vitals:   05/30/12 0743  BP: 141/60  Pulse: 94  Temp:   Resp: 18    General alert and oriented x3 Incisions in right leg healing nicely-biggest concern is inguinal wound because of the large panniculus and possible moisture Excellent Doppler flow in DP and PT with warm pink right foot Lungs no rhonchi or wheezing Cardiovascular regular rhythm no murmurs  Labs:  Lab 05/30/12 0406 05/29/12 1230  CREATININE 0.88 0.86    Lab 05/30/12 0406 05/29/12 1230  NA 139 137  K 4.4 4.1  CL 105 102  CO2 23 21  BUN 17 19  CREATININE 0.88 0.86  LABGLOM -- --  GLUCOSE 125* --  CALCIUM 8.7 9.4    Lab 05/30/12 0406 05/29/12 1230  WBC 10.8* 12.5*  HGB 11.1* 13.6  HCT 33.9* 40.3  PLT 216 267    Lab 05/29/12 1230  INR 0.96    I/O last 3 completed shifts: In: 2650 [I.V.:2650] Out: 910 [Urine:760; Blood:150]  Imaging: Dg Ang/ext/uni/or Right  05/30/2012  *RADIOLOGY REPORT*  Clinical Data: Peripheral vascular disease, femoral above-knee popliteal bypass  RIGHT ANG/EXT/UNI/ OR  Technique: Portable intraoperative single view  Comparison:  None available for  Findings: Portable intraoperative angiogram performed centered at the knee.  Distal bypass anastomosis to the above-knee popliteal artery is patent.  Popliteal artery remains patent across the knee. Two-vessel runoff noted with the dominant runoff being through the high origin anterior tibial artery.  IMPRESSION: Patent distal bypass anastomosis  Original Report Authenticated By: Judie Petit. Ruel Favors, M.D.    Assessment/Plan:  POD #1  LOS: 1 day  s/p Procedure(s): BYPASS GRAFT FEMORAL-POPLITEAL ARTERY right INTRA OPERATIVE ARTERIOGRAM  Generally doing very  well Plan out of bed today to begin slow ambulation Transfer to 2000 Keep dry gauze and right inguinal crease to try to keep inguinal wound from macerating Increase diet as tolerated Assessment is patient is ambulatory and pain tolerated adequately we'll be able to discharge-likely Monday   Josephina Gip, MD 05/30/2012 8:47 AM

## 2012-05-30 NOTE — Progress Notes (Signed)
Pt foley d/c at 0650 prior to tx pt with no post void, txing RN/N of no void

## 2012-05-31 LAB — GLUCOSE, CAPILLARY
Glucose-Capillary: 192 mg/dL — ABNORMAL HIGH (ref 70–99)
Glucose-Capillary: 226 mg/dL — ABNORMAL HIGH (ref 70–99)
Glucose-Capillary: 252 mg/dL — ABNORMAL HIGH (ref 70–99)

## 2012-05-31 NOTE — Progress Notes (Signed)
Patient ID: [name], female   DOB: 07-18-1951, 61 y.o.   MRN: [id number] Vascular Surgery Progress Note  Subjective: 3 days post urgent right femoral popliteal bypass graft for pain and right leg. States right leg feels better. He is ambulating with help. Not very motivated.  Objective:  Filed Vitals:   05/31/12 0434  BP: 110/67  Pulse: 87  Temp: 99.3 F (37.4 C)  Resp: 18    General alert and oriented x3 Lungs no rhonchi or wheezing Right leg incisions healing satisfactorily. Large panniculus and inguinal area with dry gauze present. 2+ dorsalis pedis pulse palpable.   Labs:  Lab 05/30/12 0406 05/29/12 1230  CREATININE 0.88 0.86    Lab 05/30/12 0406 05/29/12 1230  NA 139 137  K 4.4 4.1  CL 105 102  CO2 23 21  BUN 17 19  CREATININE 0.88 0.86  LABGLOM -- --  GLUCOSE 125* --  CALCIUM 8.7 9.4    Lab 05/30/12 0406 05/29/12 1230  WBC 10.8* 12.5*  HGB 11.1* 13.6  HCT 33.9* 40.3  PLT 216 267    Lab 05/29/12 1230  INR 0.96    I/O last 3 completed shifts: In: 1700 [P.O.:600; I.V.:1100] Out: 950 [Urine:950]  Imaging: Dg Ang/ext/uni/or Right  05/30/2012  *RADIOLOGY REPORT*  Clinical Data: Peripheral vascular disease, femoral above-knee popliteal bypass  RIGHT ANG/EXT/UNI/ OR  Technique: Portable intraoperative single view  Comparison:  None available for  Findings: Portable intraoperative angiogram performed centered at the knee.  Distal bypass anastomosis to the above-knee popliteal artery is patent.  Popliteal artery remains patent across the knee. Two-vessel runoff noted with the dominant runoff being through the high origin anterior tibial artery.  IMPRESSION: Patent distal bypass anastomosis  Original Report Authenticated By: Judie Petit. Ruel Favors, M.D.    Assessment/Plan:  POD #3  LOS: 2 days  s/p Procedure(s): BYPASS GRAFT FEMORAL-POPLITEAL ARTERY INTRA OPERATIVE ARTERIOGRAM  Doing well post right femoral-popliteal bypass graft Need work on ambulation  today Plan DC home in a.m. return to office in 2 weeks   Josephina Gip, MD 05/31/2012 8:56 AM

## 2012-05-31 NOTE — Progress Notes (Signed)
Pt walked 77ft. With walker tolerated well

## 2012-06-01 ENCOUNTER — Telehealth: Payer: Self-pay | Admitting: Vascular Surgery

## 2012-06-01 ENCOUNTER — Encounter (HOSPITAL_COMMUNITY): Payer: Self-pay | Admitting: Vascular Surgery

## 2012-06-01 ENCOUNTER — Other Ambulatory Visit: Payer: Self-pay | Admitting: Cardiology

## 2012-06-01 DIAGNOSIS — Z01810 Encounter for preprocedural cardiovascular examination: Secondary | ICD-10-CM

## 2012-06-01 LAB — TYPE AND SCREEN
Antibody Screen: NEGATIVE
Unit division: 0

## 2012-06-01 LAB — GLUCOSE, CAPILLARY: Glucose-Capillary: 299 mg/dL — ABNORMAL HIGH (ref 70–99)

## 2012-06-01 MED ORDER — OXYCODONE-ACETAMINOPHEN 5-325 MG PO TABS: 1.0000 | ORAL_TABLET | ORAL | Status: DC | PRN

## 2012-06-01 MED ORDER — ROSUVASTATIN CALCIUM 40 MG PO TABS: 40.0000 mg | ORAL_TABLET | Freq: Every day | ORAL | Status: DC

## 2012-06-01 NOTE — Progress Notes (Addendum)
PATIENT SUFFERS FROM PAD AND S/P SURGERY WHICH IMPAIRS THEIR ABILITY TO PERFORM DAILY ACTIVITIES LIKE (TOILETING, DRESSING) IN THE HOME. A  WALKER WILL NOT RESOLVE ISSUE PERFORMING ACTIVITIES OF DAILY LIVING. A WHEELCHAIR WILL ALLOW PATIENT TO SAFELY PERFORM DAILY ACTIVITIES. PATIENT CAN SAFELY PROPEL THE WHEELCHAIR IN THE HOME OR HAS A CAREGIVER WHO CAN PROVIDE ASSISTANCE.   [name] 06/01/2012 3:29 PM

## 2012-06-01 NOTE — Care Management Note (Signed)
    Page 1 of 1   06/01/2012     1:50:57 PM   CARE MANAGEMENT NOTE 06/01/2012  Patient:  [name], [name]   Account Number:  [id number]  Date Initiated:  06/01/2012  Documentation initiated by:  SIMMONS,Yochanan Eddleman  Subjective/Objective Assessment:   ADMITTED WITH RIGHT FEM POP; LIVES AT HOME WITH SON.     Action/Plan:   DISCHARGE PLANNING DISCUSSED AT BEDSIDE.   Anticipated DC Date:  06/01/2012   Anticipated DC Plan:  HOME W HOME HEALTH SERVICES      DC Planning Services  CM consult      PAC Choice  DURABLE MEDICAL EQUIPMENT  HOME HEALTH   Choice offered to / List presented to:     DME arranged  WHEELCHAIR - MANUAL      DME agency  [facility]        Status of service:  Completed, signed off Medicare Important Message given?   (If response is "NO", the following Medicare IM given date fields will be blank) Date Medicare IM given:   Date Additional Medicare IM given:    Discharge Disposition:  HOME W HOME HEALTH SERVICES  Per UR Regulation:  Reviewed for med. necessity/level of care/duration of stay  If discussed at Long Length of Stay Meetings, dates discussed:    Comments:  06/01/12  1349  Riese Hellard SIMMONS RN, BSN [phone] [facility]FERR[facility]ACED [facility] [place] [facility] PER PT CHOICE;  SOC DATE: 06/03/12; REFERRAL PLACED TO [place] WITH [facility] FOR W/C; AWAITING DELIVERY- THEN PT READY FOR D/C.

## 2012-06-01 NOTE — Evaluation (Signed)
Occupational Therapy Evaluation Patient Details Name: [name] MRN: [id number] DOB: Mar 22, 1951 Today's Date: 06/01/2012 Time: 1057- 1122p    OT Assessment / Plan / Recommendation Clinical Impression  Pt presents to OT s/p fem pop by pass graft. Pt will benefit from skilled OT to increase I with ADL activity and return to PLOF    OT Assessment  Patient needs continued OT Services    Follow Up Recommendations  Home health OT       Equipment Recommendations  3 in 1 bedside comode       Frequency  Min 2X/week    Precautions / Restrictions Precautions Precautions: Fall Restrictions Weight Bearing Restrictions: No       ADL       OT Diagnosis: Generalized weakness  OT Problem List: Decreased strength;Decreased safety awareness OT Treatment Interventions: Self-care/ADL training;DME and/or AE instruction;Patient/family education   OT Goals ADL Goals Pt Will Perform Lower Body Dressing: with set-up;Sit to stand from chair ADL Goal: Lower Body Dressing - Progress: Goal set today Pt Will Transfer to Toilet: with supervision;Comfort height toilet ADL Goal: Toilet Transfer - Progress: Goal set today Pt Will Perform Toileting - Clothing Manipulation: with supervision;Standing ADL Goal: Toileting - Clothing Manipulation - Progress: Goal set today Pt Will Perform Toileting - Hygiene: with supervision ADL Goal: Toileting - Hygiene - Progress: Goal set today Pt Will Perform Tub/Shower Transfer: with supervision;Other (comment) (with her 3 n 1 in shower if needed) ADL Goal: Tub/Shower Transfer - Progress: Goal set today  Visit Information  Last OT Received On: 06/01/12    Subjective Data  Subjective: [facility]   Prior Functioning  Vision/Perception  Home Living Lives With: Family Available Help at Discharge: Family;Available 24 hours/day (Son and daughter - dtr does not work) Type of Home: Mobile home Home Access: Stairs to enter Water quality scientist of Steps: 2 Entrance Stairs-Rails: Right;Left;Can reach both Home Layout: One level Bathroom Shower/Tub: Engineer, manufacturing systems: Standard Home Adaptive Equipment: Bedside commode/3-in-1;Wheelchair - manual;Walker - rolling;Shower chair with back      Cognition  Overall Cognitive Status: Appears within functional limits for tasks assessed/performed Arousal/Alertness: Awake/alert Orientation Level: Appears intact for tasks assessed Behavior During Session: [facility] for tasks performed    Extremity/Trunk Assessment Right Upper Extremity Assessment RUE ROM/Strength/Tone: [facility] for tasks assessed RUE Coordination: Deficits RUE Coordination Deficits: Deficits from stroke in December. Pt has trouble holding spoon and writing Left Upper Extremity Assessment LUE ROM/Strength/Tone: WFL for tasks assessed   Mobility Transfers Transfers: Sit to Stand;Stand to Sit Sit to Stand: 4: Min assist Stand to Sit: 4: Min assist         End of Session OT - End of Session Activity Tolerance: Patient tolerated treatment well Patient left: in bed  GO     Takia Runyon, [place] Kung 06/01/2012, 11:22 AM

## 2012-06-01 NOTE — Progress Notes (Signed)
Physical Therapy Treatment Patient Details Name: [name] MRN: [id number] DOB: 1951-07-20 Today's Date: 06/01/2012 Time: 4098-1191 PT Time Calculation (min): 13 min  PT Assessment / Plan / Recommendation Comments on Treatment Session  Pt making good progress with mobility should be appropriate to d/c home today if cleared medically.     Follow Up Recommendations  Home health PT;Supervision/Assistance - 24 hour    Barriers to Discharge        Equipment Recommendations  3 in 1 bedside comode    Recommendations for Other Services    Frequency Min 3X/week   Plan Discharge plan remains appropriate;Frequency remains appropriate    Precautions / Restrictions Precautions Precautions: Fall Restrictions Weight Bearing Restrictions: No   Pertinent Vitals/Pain Pt c/o pain in R LE. 6-8/10 RN notified.     Mobility  Bed Mobility Bed Mobility: Supine to Sit Supine to Sit: 7: Independent;HOB flat Transfers Transfers: Sit to Stand;Stand to Sit;Stand Pivot Transfers Sit to Stand: 4: Min assist Stand to Sit: 4: Min assist Details for Transfer Assistance: Cueing for hand placement.  Ambulation/Gait Ambulation/Gait Assistance: 5: Supervision Ambulation Distance (Feet): 80 Feet Assistive device: Rolling walker Ambulation/Gait Assistance Details: Pt able to tolerate PWB in RLE. Reports increase pain with wb in Rt heel.   Gait Pattern: Step-to pattern;Decreased stance time - right;Antalgic Gait velocity: WFL  Stairs: No    Exercises     PT Diagnosis:    PT Problem List:   PT Treatment Interventions:     PT Goals Acute Rehab PT Goals PT Goal Formulation: With patient Time For Goal Achievement: 06/06/12 Potential to Achieve Goals: Good Pt will go Supine/Side to Sit: with supervision PT Goal: Supine/Side to Sit - Progress: Met Pt will go Sit to Supine/Side: with supervision PT Goal: Sit to Supine/Side - Progress: Met Pt will go Sit to Stand: with supervision;with upper  extremity assist PT Goal: Sit to Stand - Progress: Met Pt will go Stand to Sit: with supervision;with upper extremity assist PT Goal: Stand to Sit - Progress: Met Pt will Transfer Bed to Chair/Chair to Bed: with min assist PT Transfer Goal: Bed to Chair/Chair to Bed - Progress: Met Pt will Ambulate: 16 - 50 feet;with min assist;with least restrictive assistive device PT Goal: Ambulate - Progress: Progressing toward goal Pt will Go Up / Down Stairs: 1-2 stairs;with rail(s);with min assist PT Goal: Up/Down Stairs - Progress: Not met  Visit Information  Last PT Received On: 06/01/12    Subjective Data      Cognition  Overall Cognitive Status: Appears within functional limits for tasks assessed/performed Arousal/Alertness: Awake/alert Orientation Level: Appears intact for tasks assessed Behavior During Session: [facility] for tasks performed    Balance  Balance Balance Assessed: No  End of Session PT - End of Session Equipment Utilized During Treatment: Gait belt Activity Tolerance: Other (comment) (Pt taken to vascular lab. ) Patient left: in chair;Other (comment) (in wheelchair with transport tech. ) Nurse Communication: Mobility status   GP     Alferd Apa 06/01/2012, 12:44 PM Theron Arista L. Maeleigh Buschman DPT [phone]

## 2012-06-01 NOTE — Discharge Summary (Signed)
Agree with above 

## 2012-06-01 NOTE — Progress Notes (Addendum)
[facility] Progress Note  06/01/2012 7:26 AM POD 3  Subjective:  Eating breakfast; no complaints  Afebrile x 24 hrs  VSS   96% RA Filed Vitals:   06/01/12 0444  BP: 136/78  Pulse: 87  Temp: 98.9 F (37.2 C)  Resp: 18    Physical Exam: Incisions:  C/d/i.  Right groin with dry sponge. Extremities:  Brisk signal right PT/PT.  Right foot warm and well perfused.  CBC    Component Value Date/Time   WBC 10.8* 05/30/2012 0406   RBC 3.92 05/30/2012 0406   HGB 11.1* 05/30/2012 0406   HCT 33.9* 05/30/2012 0406   PLT 216 05/30/2012 0406   MCV 86.5 05/30/2012 0406   MCH 28.3 05/30/2012 0406   MCHC 32.7 05/30/2012 0406   RDW 12.2 05/30/2012 0406   LYMPHSABS 2.8 10/24/2011 0540   MONOABS 0.9 10/24/2011 0540   EOSABS 0.3 10/24/2011 0540   BASOSABS 0.0 10/24/2011 0540    BMET    Component Value Date/Time   NA 139 05/30/2012 0406   K 4.4 05/30/2012 0406   CL 105 05/30/2012 0406   CO2 23 05/30/2012 0406   GLUCOSE 125* 05/30/2012 0406   BUN 17 05/30/2012 0406   CREATININE 0.88 05/30/2012 0406   CALCIUM 8.7 05/30/2012 0406   GFRNONAA 69* 05/30/2012 0406   GFRAA 81* 05/30/2012 0406    INR    Component Value Date/Time   INR 0.96 05/29/2012 1230     Intake/Output Summary (Last 24 hours) at 06/01/12 0726 Last data filed at 05/31/12 1400  Gross per 24 hour  Intake    480 ml  Output    200 ml  Net    280 ml     Assessment/Plan:  61 y.o. female is s/p BYPASS GRAFT FEMORAL-POPLITEAL ARTERY-right using non-reversed translocated saphenous vein graft from right leg  INTRA OPERATIVE ARTERIOGRAM POD 3  -pt doing well post op. -d/c home today -instructed pt after shower to dry right groin and place dry gauze to keep right groin dry and clean.  Doreatha Massed, PA-C [facility] [phone] 06/01/2012 7:26 AM   Agree with above DC home today

## 2012-06-01 NOTE — Progress Notes (Signed)
ABI completed.  Preliminary report is 0.62 on the right and 0.47 on the left.

## 2012-06-01 NOTE — Discharge Summary (Signed)
[facility] Discharge Summary  [name] Feb 12, 1951 61 y.o. female  [id number]  Admission Date: 05/29/2012  Discharge Date: 06/01/12  Physician: Pryor Ochoa, MD  Admission Diagnosis: cold leg   HPI:   This is a 61 y.o. female with known severe peripheral arterial disease bilaterally. She has had a 2-3 week history of pain in her right first toe. The pain is now present in the entire forefoot She states that the pain gets worse if there is a thunderstorm in the area. She has no open wounds on the foot. She has a history of an ABI less than 0.5 bilaterally. She states that she is ambulatory with her normal amount of claudication. She is currently taking Percocet but still has pain in the foot. She states also she feels like the foot has a heavy sensation.   Hospital Course:  The patient was admitted to the hospital and taken to the operating room on 05/29/2012 and underwent BYPASS GRAFT FEMORAL-POPLITEAL ARTERY-right using non-reversed translocated saphenous vein graft from right leg  INTRA OPERATIVE ARTERIOGRAM.  The pt tolerated the procedure well and was transported to the PACU in good condition. By POD 1, she was transferred to the telemetry floor.  She continued increased mobilization.  By POD 3, she had brisk doppler signals in her pedal pulses and is discharged home.  The remainder of the hospital course consisted of increasing mobilization and increasing intake of solids without difficulty.  CBC    Component Value Date/Time   WBC 10.8* 05/30/2012 0406   RBC 3.92 05/30/2012 0406   HGB 11.1* 05/30/2012 0406   HCT 33.9* 05/30/2012 0406   PLT 216 05/30/2012 0406   MCV 86.5 05/30/2012 0406   MCH 28.3 05/30/2012 0406   MCHC 32.7 05/30/2012 0406   RDW 12.2 05/30/2012 0406   LYMPHSABS 2.8 10/24/2011 0540   MONOABS 0.9 10/24/2011 0540   EOSABS 0.3 10/24/2011 0540   BASOSABS 0.0 10/24/2011 0540    BMET    Component Value Date/Time   NA 139 05/30/2012 0406   K  4.4 05/30/2012 0406   CL 105 05/30/2012 0406   CO2 23 05/30/2012 0406   GLUCOSE 125* 05/30/2012 0406   BUN 17 05/30/2012 0406   CREATININE 0.88 05/30/2012 0406   CALCIUM 8.7 05/30/2012 0406   GFRNONAA 69* 05/30/2012 0406   GFRAA 81* 05/30/2012 0406     Discharge Instructions:   The patient is discharged to home with extensive instructions on wound care and progressive ambulation.  They are instructed not to drive or perform any heavy lifting until returning to see the physician in his office.  Discharge Orders    Future Appointments: Provider: Department: Dept Phone: Center:   06/04/2012 3:20 PM Beatrice Lecher, PA [facility] [phone] [facility]   06/05/2012 3:00 PM [facility]  None   11/19/2012 1:00 PM [facility] [phone] [facility]   11/19/2012 1:30 PM [facility] [phone] [facility]   11/19/2012 2:00 PM Evern Bio, NP [facility] [phone] [facility]     Future Orders Please Complete By Expires   Resume previous diet      Driving Restrictions      Comments:   No driving for 2 weeks   Lifting restrictions      Comments:   No lifting for 6 weeks   Call MD for:  temperature >100.5      Call MD for:  redness, tenderness, or signs of  infection (pain, swelling, bleeding, redness, odor or green/yellow discharge around incision site)      Call MD for:  severe or increased pain, loss or decreased feeling  in affected limb(s)      Discharge wound care:      Comments:   Shower daily with soap and water starting 06/02/12      Discharge Diagnosis:  cold leg  Secondary Diagnosis: Patient Active Problem List  Diagnosis  . OSA (obstructive sleep apnea)  . PAD (peripheral artery disease)  . COPD (chronic obstructive pulmonary disease)  . Systolic and diastolic CHF, chronic  . CAD (coronary artery disease)  . Hypertension  . Hyperlipidemia  . Ejection fraction < 50%  . Diastolic dysfunction  . Syncope  . CVA (cerebral  vascular accident)  . Hemiparesis, acute  . DM type 2 (diabetes mellitus, type 2)  . Hypokalemia  . CVA (cerebral infarction)  . Peripheral vascular disease, unspecified  . Atherosclerosis of native arteries of the extremities with rest pain  . Atherosclerosis of native arteries of the extremities with intermittent claudication   Past Medical History  Diagnosis Date  . GERD (gastroesophageal reflux disease)   . PAD (peripheral artery disease)     Stent, embolectomy, surgery  /     Dr. Darrick Penna, ABI 0.52 right, 0.54 left, February, 2010  . OSA (obstructive sleep apnea)     Dr. Craige Cotta  CPAP started August, 2012  . Systolic and diastolic CHF, chronic   . CAD (coronary artery disease)     June, 2012, DES to RCA, staged DES to circumflex and DES to diagonal with pressure wire to LAD, FFR 0.84  . DM (diabetes mellitus), type 2   . Hypertension   . Morbid obesity   . Hyperlipidemia   . Ejection fraction < 50%     EF 45-50%, echo, June, 2012  . Diastolic dysfunction     grade 2, echo, June, 2012  . Peripheral neuropathy   . Tobacco abuse   . Syncope     vasovagal  . Breast cancer     lumpectomy and radiation 2002  . MI, old   . Myocardial infarction 10/19/11  . DVT (deep venous thrombosis)   . Shortness of breath     with exertion      Gaby, Harney  Home Medication Instructions RUE:[id number]   Printed on:06/01/12 1478  Medication Information                    amLODipine (NORVASC) 5 MG tablet Take 5 mg by mouth daily.            pantoprazole (PROTONIX) 40 MG tablet Take 40 mg by mouth daily.             rosuvastatin (CRESTOR) 40 MG tablet Take 40 mg by mouth daily.             furosemide (LASIX) 40 MG tablet Take 40 mg by mouth daily.             metoprolol tartrate (LOPRESSOR) 25 MG tablet Take 25 mg by mouth 2 (two) times daily.            albuterol-ipratropium (COMBIVENT) 18-103 MCG/ACT inhaler Inhale 2 puffs into the lungs every 3 (three) hours as needed. For  shortness of breath.           nitroGLYCERIN (NITROSTAT) 0.4 MG SL tablet Place 1 tablet (0.4 mg total) under the tongue every 5 (five) minutes as needed  for chest pain.           metoCLOPramide (REGLAN) 5 MG tablet Take 5 mg by mouth 3 (three) times daily.            clindamycin (CLEOCIN) 150 MG capsule Take 150 mg by mouth daily as needed. When patient feels that cellulitis in foot is going to flare up.            ondansetron (ZOFRAN) 4 MG tablet Take 4 mg by mouth every 8 (eight) hours as needed. Nausea            glimepiride (AMARYL) 4 MG tablet Take 1 tablet (4 mg total) by mouth daily with breakfast.           potassium chloride SA (K-DUR,KLOR-CON) 20 MEQ tablet Take 1 tablet (20 mEq total) by mouth daily at 12 noon.           levETIRAcetam (KEPPRA) 250 MG tablet Take 250 mg by mouth every 12 (twelve) hours.           insulin regular (NOVOLIN R,HUMULIN R) 100 units/mL injection Inject into the skin 4 (four) times daily as needed. Per sliding scale.           insulin glargine (LANTUS) 100 UNIT/ML injection Inject 30-45 Units into the skin at bedtime. 45 units at night and 30 in the morning           clopidogrel (PLAVIX) 75 MG tablet Take 1 tablet (75 mg total) by mouth daily.           aspirin EC 81 MG tablet Take 81 mg by mouth daily.           oxyCODONE-acetaminophen (PERCOCET/ROXICET) 5-325 MG per tablet Take 1 tablet by mouth every 4 (four) hours as needed. For pain #30 NR            Disposition: home  Patient's condition: is Good  Follow up: 1. Dr. Hart Rochester in 2 weeks   Doreatha Massed, PA-C [facility] [phone] 06/01/2012  7:38 AM

## 2012-06-01 NOTE — Telephone Encounter (Addendum)
Message copied by Shari Prows on Mon Jun 01, 2012 10:37 AM ------      Message from: Lorin Mercy K      Created: Mon Jun 01, 2012  8:11 AM                   ----- Message -----         From: Dara Lords, PA         Sent: 06/01/2012   7:35 AM           To: Sharee Pimple, CMA            [name],Sherri JFemale, 61 y.o., [birth year]                   S/p right fem pop with vein.      F/u with JDL in 2 weeks.            Thanks,      SAmantha  Pt scheduled for JDL on 06/23/12 at 11:15am. I left message for pt regarding this appt and also mailed letter to her home. Drinda Butts tobin

## 2012-06-02 MED FILL — Metoclopramide HCl Tab 5 MG (Base Equivalent): ORAL | Qty: 1 | Status: AC

## 2012-06-02 MED FILL — Insulin Glargine Inj 100 Unit/ML: SUBCUTANEOUS | Qty: 0.3 | Status: AC

## 2012-06-02 MED FILL — Enoxaparin Sodium Inj 40 MG/0.4ML: SUBCUTANEOUS | Qty: 0.4 | Status: AC

## 2012-06-02 MED FILL — Oxycodone w/ Acetaminophen Tab 5-325 MG: ORAL | Qty: 1 | Status: AC

## 2012-06-02 MED FILL — Levetiracetam Tab 250 MG: ORAL | Qty: 1 | Status: AC

## 2012-06-03 ENCOUNTER — Other Ambulatory Visit: Payer: Self-pay | Admitting: *Deleted

## 2012-06-03 MED ORDER — LEVETIRACETAM 250 MG PO TABS: 250.0000 mg | ORAL_TABLET | Freq: Two times a day (BID) | ORAL | Status: DC

## 2012-06-04 ENCOUNTER — Ambulatory Visit: Payer: Medicare Other | Admitting: Vascular Surgery

## 2012-06-04 ENCOUNTER — Ambulatory Visit: Payer: Medicare Other | Admitting: Physician Assistant

## 2012-06-05 ENCOUNTER — Other Ambulatory Visit (HOSPITAL_COMMUNITY): Payer: Medicare Other

## 2012-06-10 ENCOUNTER — Encounter (HOSPITAL_COMMUNITY): Admission: RE | Payer: Self-pay | Source: Ambulatory Visit

## 2012-06-10 ENCOUNTER — Ambulatory Visit (HOSPITAL_COMMUNITY): Admission: RE | Admit: 2012-06-10 | Payer: Medicare Other | Source: Ambulatory Visit | Admitting: Vascular Surgery

## 2012-06-10 SURGERY — BYPASS GRAFT FEMORAL-POPLITEAL ARTERY
Anesthesia: General | Site: Leg Lower | Laterality: Right

## 2012-06-19 ENCOUNTER — Other Ambulatory Visit: Payer: Self-pay | Admitting: *Deleted

## 2012-06-19 DIAGNOSIS — I739 Peripheral vascular disease, unspecified: Secondary | ICD-10-CM

## 2012-06-19 DIAGNOSIS — T8131XA Disruption of external operation (surgical) wound, not elsewhere classified, initial encounter: Secondary | ICD-10-CM

## 2012-06-22 ENCOUNTER — Encounter: Payer: Self-pay | Admitting: Vascular Surgery

## 2012-06-23 ENCOUNTER — Ambulatory Visit (INDEPENDENT_AMBULATORY_CARE_PROVIDER_SITE_OTHER): Payer: Medicare Other | Admitting: Vascular Surgery

## 2012-06-23 ENCOUNTER — Encounter: Payer: Self-pay | Admitting: Vascular Surgery

## 2012-06-23 VITALS — BP 148/73 | HR 109 | Resp 18 | Ht 63.0 in | Wt 208.0 lb

## 2012-06-23 DIAGNOSIS — I739 Peripheral vascular disease, unspecified: Secondary | ICD-10-CM | POA: Insufficient documentation

## 2012-06-23 NOTE — Progress Notes (Signed)
Subjective:     Patient ID: [name], female   DOB: [birth year], 61 y.o.   MRN: [id number]  HPI this 61 year old female returns almost 4 weeks post right femoral-popliteal bypass graft performed for ischemia of the right lower extremity. She states her right foot feels much better. She has been having a small area in the right inguinal wound packed with moist saline gauze on a daily basis. Home health nurse has been visiting and supervising this. She states the right foot is much warmer than before.  Review of Systems     Objective:   Physical ExamBP 148/73  Pulse 109  Resp 18  Ht 5\' 3"  (1.6 m)  Wt 208 lb (94.348 kg)  BMI 36.85 kg/m2  General alert and oriented x3 in no apparent stress Feeble-appearing female in wheelchair Right inguinal wound essentially completely healed except right in the inguinal crease 2 cm area very superficial closure which is being treated with packing 1+ edema and distal thigh with very minimal separation of distal thigh wound Right foot with excellent AP and PT flow by Doppler    Assessment:     Nicely functioning right femoral popliteal saphenous vein graft performed for ischemia    Plan:     Return in 2 months with duplex scan of vein graft and ABIs to be seen by nurse practitioner Continue local wound care and right inguinal wound as you are doing-should heal t within next few weeks

## 2012-07-05 ENCOUNTER — Other Ambulatory Visit: Payer: Self-pay | Admitting: Cardiology

## 2012-07-07 ENCOUNTER — Other Ambulatory Visit: Payer: Self-pay | Admitting: Physical Medicine & Rehabilitation

## 2012-07-07 ENCOUNTER — Other Ambulatory Visit: Payer: Self-pay | Admitting: *Deleted

## 2012-07-07 MED ORDER — METOPROLOL TARTRATE 25 MG PO TABS: 25.0000 mg | ORAL_TABLET | Freq: Two times a day (BID) | ORAL | Status: DC

## 2012-08-18 ENCOUNTER — Other Ambulatory Visit: Payer: Self-pay | Admitting: *Deleted

## 2012-08-18 DIAGNOSIS — Z48812 Encounter for surgical aftercare following surgery on the circulatory system: Secondary | ICD-10-CM

## 2012-08-18 DIAGNOSIS — I739 Peripheral vascular disease, unspecified: Secondary | ICD-10-CM

## 2012-08-20 ENCOUNTER — Other Ambulatory Visit: Payer: Self-pay | Admitting: *Deleted

## 2012-08-20 DIAGNOSIS — Z48812 Encounter for surgical aftercare following surgery on the circulatory system: Secondary | ICD-10-CM

## 2012-08-20 DIAGNOSIS — I739 Peripheral vascular disease, unspecified: Secondary | ICD-10-CM

## 2012-08-21 ENCOUNTER — Encounter: Payer: Self-pay | Admitting: Neurosurgery

## 2012-08-24 ENCOUNTER — Ambulatory Visit (INDEPENDENT_AMBULATORY_CARE_PROVIDER_SITE_OTHER): Payer: Medicare Other | Admitting: Vascular Surgery

## 2012-08-24 ENCOUNTER — Ambulatory Visit (INDEPENDENT_AMBULATORY_CARE_PROVIDER_SITE_OTHER): Payer: Medicare Other | Admitting: Neurosurgery

## 2012-08-24 ENCOUNTER — Encounter: Payer: Self-pay | Admitting: Neurosurgery

## 2012-08-24 VITALS — BP 168/89 | HR 76 | Resp 16 | Ht 63.0 in | Wt 197.0 lb

## 2012-08-24 DIAGNOSIS — I739 Peripheral vascular disease, unspecified: Secondary | ICD-10-CM

## 2012-08-24 DIAGNOSIS — Z48812 Encounter for surgical aftercare following surgery on the circulatory system: Secondary | ICD-10-CM

## 2012-08-24 NOTE — Progress Notes (Signed)
Ankle brachial indices performed @ [facility] 08/24/2012

## 2012-08-24 NOTE — Progress Notes (Signed)
[facility] [place] PAD/PVD Office Note  CC: PVD surveillance Referring Physician: Hart Rochester  History of Present Illness:61 year old female returns almost 12 weeks post right femoral-popliteal bypass graft performed for ischemia of the right lower extremity. The patient denies claudication, rest pain or open ulcerations. The patient's right lower extremity incisions are healed well. The patient states she is able to walk but she does still use a walker for support at certain times. The patient denies any other vascular difficulties at this time.   Past Medical History  Diagnosis Date  . GERD (gastroesophageal reflux disease)   . PAD (peripheral artery disease)     Stent, embolectomy, surgery  /     Dr. Darrick Penna, ABI 0.52 right, 0.54 left, February, 2010  . OSA (obstructive sleep apnea)     Dr. Craige Cotta  CPAP started August, 2012  . Systolic and diastolic CHF, chronic   . CAD (coronary artery disease)     June, 2012, DES to RCA, staged DES to circumflex and DES to diagonal with pressure wire to LAD, FFR 0.84  . DM (diabetes mellitus), type 2   . Hypertension   . Morbid obesity   . Hyperlipidemia   . Ejection fraction < 50%     EF 45-50%, echo, June, 2012  . Diastolic dysfunction     grade 2, echo, June, 2012  . Peripheral neuropathy   . Tobacco abuse   . Syncope     vasovagal  . Breast cancer     lumpectomy and radiation 2002  . DVT (deep venous thrombosis)   . Shortness of breath     with exertion  . 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 2012    Right side  . MI, old     ROS: [x]  Positive   [ ]  Denies    General: [ ]  Weight loss, [ ]  Fever, [ ]  chills Neurologic: [ ]  Dizziness, [ ]  Blackouts, [ ]  Seizure [ ]  Stroke, [ ]  "Mini stroke", [ ]  Slurred speech, [ ]  Temporary blindness; [ ]  weakness in arms or legs, [ ]  Hoarseness Cardiac: [ ]  Chest pain/pressure, [ ]  Shortness of breath at rest [ ]  Shortness of breath with exertion, [ ]  Atrial fibrillation or irregular heartbeat Vascular:  [ ]  Pain in legs with walking, [ ]  Pain in legs at rest, [ ]  Pain in legs at night,  [ ]  Non-healing ulcer, [ ]  Blood clot in vein/DVT,   Pulmonary: [ ]  Home oxygen, [ ]  Productive cough, [ ]  Coughing up blood, [ ]  Asthma,  [ ]  Wheezing Musculoskeletal:  [ ]  Arthritis, [ ]  Low back pain, [ ]  Joint pain Hematologic: [ ]  Easy Bruising, [ ]  Anemia; [ ]  Hepatitis Gastrointestinal: [ ]  Blood in stool, [ ]  Gastroesophageal Reflux/heartburn, [ ]  Trouble swallowing Urinary: [ ]  chronic Kidney disease, [ ]  on HD - [ ]  MWF or [ ]  TTHS, [ ]  Burning with urination, [ ]  Difficulty urinating Skin: [ ]  Rashes, [ ]  Wounds Psychological: [ ]  Anxiety, [ ]  Depression   Social History History  Substance Use Topics  . Smoking status: Former Smoker -- 1.5 packs/day for 44 years    Types: Cigarettes    Quit date: 11/30/2010  . Smokeless tobacco: Never Used  . Alcohol Use: No    Family History Family History  Problem Relation Age of Onset  . Hypertension Sister   . Diabetes Sister   . Hyperlipidemia Sister   . Hyperlipidemia Sister   . Hyperlipidemia Brother   .  Diabetes Brother   . Lung cancer Father   . Cancer Father   . Heart disease Father   . Hypertension Father   . Stomach cancer Sister   . Diabetes Daughter     No Known Allergies  Current Outpatient Prescriptions  Medication Sig Dispense Refill  . albuterol-ipratropium (COMBIVENT) 18-103 MCG/ACT inhaler Inhale 2 puffs into the lungs every 3 (three) hours as needed. For shortness of breath.      Marland Kitchen amLODipine (NORVASC) 5 MG tablet Take 5 mg by mouth daily.       Marland Kitchen aspirin EC 81 MG tablet Take 81 mg by mouth daily.      . clindamycin (CLEOCIN) 150 MG capsule Take 150 mg by mouth daily as needed. When patient feels that cellulitis in foot is going to flare up.       . clopidogrel (PLAVIX) 75 MG tablet Take 1 tablet (75 mg total) by mouth daily.  30 tablet  1  . CRESTOR 40 MG tablet TAKE 1 TABLET BY MOUTH EVERY DAY  30 tablet  0  .  furosemide (LASIX) 40 MG tablet Take 40 mg by mouth daily.        Marland Kitchen glimepiride (AMARYL) 4 MG tablet Take 1 tablet (4 mg total) by mouth daily with breakfast.  30 tablet  1  . HYDROcodone-acetaminophen (NORCO/VICODIN) 5-325 MG per tablet daily.      . insulin glargine (LANTUS) 100 UNIT/ML injection Inject 30-45 Units into the skin at bedtime. 45 units at night and 30 in the morning      . insulin regular (NOVOLIN R,HUMULIN R) 100 units/mL injection Inject into the skin 4 (four) times daily as needed. Per sliding scale.      . metoCLOPramide (REGLAN) 5 MG tablet Take 5 mg by mouth 3 (three) times daily.       . metoprolol tartrate (LOPRESSOR) 25 MG tablet Take 1 tablet (25 mg total) by mouth 2 (two) times daily.  60 tablet  1  . nitroGLYCERIN (NITROSTAT) 0.4 MG SL tablet Place 0.4 mg under the tongue every 5 (five) minutes as needed.      . ondansetron (ZOFRAN) 4 MG tablet Take 4 mg by mouth every 8 (eight) hours as needed. Nausea       . pantoprazole (PROTONIX) 40 MG tablet Take 40 mg by mouth daily.        . potassium chloride SA (K-DUR,KLOR-CON) 20 MEQ tablet Take 1 tablet (20 mEq total) by mouth daily at 12 noon.  30 tablet  1  . traMADol (ULTRAM) 50 MG tablet as needed.      Marland Kitchen DISCONTD: nitroGLYCERIN (NITROSTAT) 0.4 MG SL tablet Place 1 tablet (0.4 mg total) under the tongue every 5 (five) minutes as needed for chest pain.  25 tablet  3  . levETIRAcetam (KEPPRA) 250 MG tablet TAKE 1 TABLET BY MOUTH EVERY 12 HOURS  60 tablet  0  . oxyCODONE-acetaminophen (PERCOCET/ROXICET) 5-325 MG per tablet Take 1 tablet by mouth every 4 (four) hours as needed. For pain  30 tablet  0    Physical Examination  Filed Vitals:   08/24/12 1519  BP: 168/89  Pulse: 76  Resp: 16    Body mass index is 34.90 kg/(m^2).  General:  WDWN in NAD Gait: Normal HEENT: WNL Eyes: Pupils equal Pulmonary: normal non-labored breathing , without Rales, rhonchi,  wheezing Cardiac: RRR, without  Murmurs, rubs or  gallops; No carotid bruits Abdomen: soft, NT, no masses Skin: no rashes, ulcers  noted Vascular Exam/Pulses: Right lower extremity pulses are heard with Doppler only  Extremities without ischemic changes, no Gangrene , no cellulitis; no open wounds;  Musculoskeletal: no muscle wasting or atrophy  Neurologic: A&O X 3; Appropriate Affect ; SENSATION: normal; MOTOR FUNCTION:  moving all extremities equally. Speech is fluent/normal  Non-Invasive Vascular Imaging: Patent right lower extremity femoropopliteal bypass some narrowing mid graft with elevation of 184. This was reviewed with Dr. Hart Rochester. ABIs today are 0.70 and biphasic to monophasic on the right, 0.49 and monophasic on the left.  ASSESSMENT/PLAN: The patient with improved claudication status post right lower extremity bypass graft. The patient will followup in 3 months per Dr. Hart Rochester with repeat ABIs and duplex of the graft. The patient's questions were encouraged and answered, she is in agreement with this plan.  Lauree Chandler ANP  Clinic M.D.: Hart Rochester

## 2012-08-24 NOTE — Progress Notes (Signed)
Right arterial duplex performed @ [facility] 08/24/2012

## 2012-10-10 ENCOUNTER — Other Ambulatory Visit: Payer: Self-pay | Admitting: Physician Assistant

## 2012-11-19 ENCOUNTER — Ambulatory Visit: Payer: Medicare Other | Admitting: Neurosurgery

## 2012-11-30 ENCOUNTER — Encounter: Payer: Self-pay | Admitting: Vascular Surgery

## 2012-12-01 ENCOUNTER — Ambulatory Visit: Payer: Medicare Other | Admitting: Vascular Surgery

## 2013-02-08 ENCOUNTER — Encounter: Payer: Self-pay | Admitting: Vascular Surgery

## 2013-02-09 ENCOUNTER — Encounter (INDEPENDENT_AMBULATORY_CARE_PROVIDER_SITE_OTHER): Payer: Medicare Other | Admitting: *Deleted

## 2013-02-09 ENCOUNTER — Ambulatory Visit (INDEPENDENT_AMBULATORY_CARE_PROVIDER_SITE_OTHER): Payer: Medicare Other | Admitting: Vascular Surgery

## 2013-02-09 ENCOUNTER — Other Ambulatory Visit: Payer: Self-pay

## 2013-02-09 ENCOUNTER — Encounter: Payer: Self-pay | Admitting: Vascular Surgery

## 2013-02-09 VITALS — BP 176/80 | HR 95 | Resp 16 | Ht 63.0 in | Wt 203.0 lb

## 2013-02-09 DIAGNOSIS — Z48812 Encounter for surgical aftercare following surgery on the circulatory system: Secondary | ICD-10-CM

## 2013-02-09 DIAGNOSIS — I739 Peripheral vascular disease, unspecified: Secondary | ICD-10-CM

## 2013-02-09 NOTE — Progress Notes (Signed)
Subjective:     Patient ID: [name], female   DOB: 02-25-1951, 62 y.o.   MRN: [id number]  HPI this 62 year old female returns for continued followup regarding a right femoral popliteal bypass graft and left SFA stent. She had evidence of some elevated velocity in the mid portion of the vein graft in the past. She has no change in her lower extremity symptoms. She does not ambulate much. She has no history of rest pain or nonhealing ulcers or infections. She has had a previous left brain CVA.  Past Medical History  Diagnosis Date  . GERD (gastroesophageal reflux disease)   . PAD (peripheral artery disease)     Stent, embolectomy, surgery  /     Dr. Darrick Penna, ABI 0.52 right, 0.54 left, February, 2010  . OSA (obstructive sleep apnea)     Dr. Craige Cotta  CPAP started August, 2012  . Systolic and diastolic CHF, chronic   . CAD (coronary artery disease)     June, 2012, DES to RCA, staged DES to circumflex and DES to diagonal with pressure wire to LAD, FFR 0.84  . DM (diabetes mellitus), type 2   . Hypertension   . Morbid obesity   . Hyperlipidemia   . Ejection fraction < 50%     EF 45-50%, echo, June, 2012  . Diastolic dysfunction     grade 2, echo, June, 2012  . Peripheral neuropathy   . Tobacco abuse   . Syncope     vasovagal  . Breast cancer     lumpectomy and radiation 2002  . DVT (deep venous thrombosis)   . Shortness of breath     with exertion  . Stroke Dec. 2012    Right side  . MI, old June 2012    Heart Attack    History  Substance Use Topics  . Smoking status: Former Smoker -- 1.50 packs/day for 44 years    Types: Cigarettes    Quit date: 11/30/2010  . Smokeless tobacco: Never Used  . Alcohol Use: No    Family History  Problem Relation Age of Onset  . Hypertension Sister   . Diabetes Sister   . Hyperlipidemia Sister   . Hyperlipidemia Sister   . Hyperlipidemia Brother   . Diabetes Brother   . Lung cancer Father   . Cancer Father   . Heart disease Father   .  Hypertension Father   . Stomach cancer Sister   . Diabetes Daughter     No Known Allergies  Current outpatient prescriptions:aspirin EC 81 MG tablet, Take 81 mg by mouth daily., Disp: , Rfl: ;  clopidogrel (PLAVIX) 75 MG tablet, TAKE 1 TABLET BY MOUTH EVERY DAY, Disp: 30 tablet, Rfl: 0;  CRESTOR 40 MG tablet, TAKE 1 TABLET BY MOUTH EVERY DAY, Disp: 30 tablet, Rfl: 0;  furosemide (LASIX) 40 MG tablet, Take 40 mg by mouth daily.  , Disp: , Rfl: ;  HUMALOG KWIKPEN 100 UNIT/ML injection, , Disp: , Rfl:  insulin glargine (LANTUS) 100 UNIT/ML injection, Inject 35-40 Units into the skin 2 (two) times daily., Disp: , Rfl: ;  insulin regular (NOVOLIN R,HUMULIN R) 100 units/mL injection, Inject into the skin 4 (four) times daily as needed. Per sliding scale., Disp: , Rfl: ;  metoprolol tartrate (LOPRESSOR) 25 MG tablet, Take 1 tablet (25 mg total) by mouth 2 (two) times daily., Disp: 60 tablet, Rfl: 1 pantoprazole (PROTONIX) 40 MG tablet, Take 40 mg by mouth daily.  , Disp: , Rfl: ;  potassium chloride (KLOR-CON) 20 MEQ packet, Take 20 mEq by mouth 2 (two) times daily., Disp: , Rfl: ;  albuterol-ipratropium (COMBIVENT) 18-103 MCG/ACT inhaler, Inhale 2 puffs into the lungs every 3 (three) hours as needed. For shortness of breath., Disp: , Rfl: ;  amLODipine (NORVASC) 5 MG tablet, Take 5 mg by mouth daily. , Disp: , Rfl:  clindamycin (CLEOCIN) 150 MG capsule, Take 150 mg by mouth daily as needed. When patient feels that cellulitis in foot is going to flare up. , Disp: , Rfl: ;  glimepiride (AMARYL) 4 MG tablet, Take 1 tablet (4 mg total) by mouth daily with breakfast., Disp: 30 tablet, Rfl: 1;  HYDROcodone-acetaminophen (NORCO/VICODIN) 5-325 MG per tablet, daily., Disp: , Rfl:  insulin glargine (LANTUS) 100 UNIT/ML injection, Inject 30-45 Units into the skin at bedtime. 45 units at night and 30 in the morning, Disp: , Rfl: ;  levETIRAcetam (KEPPRA) 250 MG tablet, TAKE 1 TABLET BY MOUTH EVERY 12 HOURS, Disp: 60  tablet, Rfl: 0;  metoCLOPramide (REGLAN) 5 MG tablet, Take 5 mg by mouth 3 (three) times daily. , Disp: , Rfl:  nitroGLYCERIN (NITROSTAT) 0.4 MG SL tablet, Place 0.4 mg under the tongue every 5 (five) minutes as needed., Disp: , Rfl: ;  ondansetron (ZOFRAN) 4 MG tablet, Take 4 mg by mouth every 8 (eight) hours as needed. Nausea , Disp: , Rfl: ;  oxyCODONE-acetaminophen (PERCOCET/ROXICET) 5-325 MG per tablet, Take 1 tablet by mouth every 4 (four) hours as needed. For pain, Disp: 30 tablet, Rfl: 0 potassium chloride SA (K-DUR,KLOR-CON) 20 MEQ tablet, Take 1 tablet (20 mEq total) by mouth daily at 12 noon., Disp: 30 tablet, Rfl: 1;  traMADol (ULTRAM) 50 MG tablet, as needed., Disp: , Rfl:   BP 176/80  Pulse 95  Resp 16  Ht 5\' 3"  (1.6 m)  Wt 203 lb (92.08 kg)  BMI 35.97 kg/m2  SpO2 98%  Body mass index is 35.97 kg/(m^2).          Review of Systems denies active chest pain. Does not ambulate much. No PND or orthopnea. Denies hemoptysis or nonhealing ulcers.       Objective:   Physical Exam blood pressure 176/80 heart rate 95 respirations 16 General elderly female in wheelchair in no apparent stress alert and oriented x3 Lungs no rhonchi or wheezing Cardiovascular regular rhythm no murmurs Abdomen obese no palpable masses Lower extremities with 3+ dorsalis pedis pulse palpable on the right no palpable pulses on left. No evidence of ischemia or infection bilaterally  Today I ordered bilateral lower extremity ABIs. The right is 0.84 compared to previous 0.70 in the left is 0.54 compared to previous 0.49. There is some mild elevation in the midportion of the right saphenous vein graft which is improved last study. There is also some significant elevated velocity in the proximal left SFA proximal to previous stent.    Assessment:     In stable right femoral popliteal graft and left leg SFA stent-possible stenosis proximal the left SFA stent ABIs improved bilaterally    Plan:      Return in 6 months for continued monitoring with ABIs and duplex scans bilateral lower extremities

## 2013-02-09 NOTE — Addendum Note (Signed)
Addended by: Adria Dill L on: 02/09/2013 04:51 PM   Modules accepted: Orders

## 2013-02-09 NOTE — Addendum Note (Signed)
Addended by: Dannielle Karvonen on: 02/09/2013 04:28 PM   Modules accepted: Orders

## 2013-02-25 ENCOUNTER — Telehealth: Payer: Self-pay | Admitting: Cardiology

## 2013-02-25 NOTE — Telephone Encounter (Signed)
Send refills to Dr. Sherryll Burger

## 2013-03-05 ENCOUNTER — Encounter (INDEPENDENT_AMBULATORY_CARE_PROVIDER_SITE_OTHER): Payer: Medicare Other | Admitting: Ophthalmology

## 2013-03-05 DIAGNOSIS — I10 Essential (primary) hypertension: Secondary | ICD-10-CM

## 2013-03-05 DIAGNOSIS — E1139 Type 2 diabetes mellitus with other diabetic ophthalmic complication: Secondary | ICD-10-CM

## 2013-03-05 DIAGNOSIS — H2510 Age-related nuclear cataract, unspecified eye: Secondary | ICD-10-CM

## 2013-03-05 DIAGNOSIS — H43819 Vitreous degeneration, unspecified eye: Secondary | ICD-10-CM

## 2013-03-05 DIAGNOSIS — H3581 Retinal edema: Secondary | ICD-10-CM

## 2013-03-05 DIAGNOSIS — H35039 Hypertensive retinopathy, unspecified eye: Secondary | ICD-10-CM

## 2013-03-05 DIAGNOSIS — E11319 Type 2 diabetes mellitus with unspecified diabetic retinopathy without macular edema: Secondary | ICD-10-CM

## 2013-03-17 ENCOUNTER — Other Ambulatory Visit (INDEPENDENT_AMBULATORY_CARE_PROVIDER_SITE_OTHER): Payer: Medicare Other | Admitting: Ophthalmology

## 2013-03-19 ENCOUNTER — Other Ambulatory Visit (INDEPENDENT_AMBULATORY_CARE_PROVIDER_SITE_OTHER): Payer: Medicare Other | Admitting: Ophthalmology

## 2013-03-19 DIAGNOSIS — H3581 Retinal edema: Secondary | ICD-10-CM

## 2013-03-19 DIAGNOSIS — E1165 Type 2 diabetes mellitus with hyperglycemia: Secondary | ICD-10-CM

## 2013-05-25 ENCOUNTER — Other Ambulatory Visit: Payer: Self-pay | Admitting: Physical Medicine & Rehabilitation

## 2013-07-13 ENCOUNTER — Ambulatory Visit (INDEPENDENT_AMBULATORY_CARE_PROVIDER_SITE_OTHER): Payer: Medicare Other | Admitting: Endocrinology

## 2013-07-13 ENCOUNTER — Encounter: Payer: Self-pay | Admitting: Endocrinology

## 2013-07-13 VITALS — BP 126/84 | HR 80 | Ht 62.0 in | Wt 219.0 lb

## 2013-07-13 DIAGNOSIS — E042 Nontoxic multinodular goiter: Secondary | ICD-10-CM

## 2013-07-13 DIAGNOSIS — E0590 Thyrotoxicosis, unspecified without thyrotoxic crisis or storm: Secondary | ICD-10-CM

## 2013-07-13 DIAGNOSIS — M810 Age-related osteoporosis without current pathological fracture: Secondary | ICD-10-CM

## 2013-07-13 NOTE — Progress Notes (Signed)
Subjective:    Patient ID: [name], female    DOB: [birth year], 62 y.o.   MRN: [id number]  HPI Pt says she was noted on routine labs a few mos ago to have an abnormal TSH.  Pt reports slight tremor of the hands, and assoc weight gain.   Past Medical History  Diagnosis Date  . GERD (gastroesophageal reflux disease)   . PAD (peripheral artery disease)     Stent, embolectomy, surgery  /     Dr. Darrick Penna, ABI 0.52 right, 0.54 left, February, 2010  . OSA (obstructive sleep apnea)     Dr. Craige Cotta  CPAP started August, 2012  . Systolic and diastolic CHF, chronic   . CAD (coronary artery disease)     June, 2012, DES to RCA, staged DES to circumflex and DES to diagonal with pressure wire to LAD, FFR 0.84  . DM (diabetes mellitus), type 2   . Hypertension   . Morbid obesity   . Hyperlipidemia   . Ejection fraction < 50%     EF 45-50%, echo, June, 2012  . Diastolic dysfunction     grade 2, echo, June, 2012  . Peripheral neuropathy   . Tobacco abuse   . Syncope     vasovagal  . Breast cancer     lumpectomy and radiation 2002  . DVT (deep venous thrombosis)   . Shortness of breath     with exertion  . Stroke Dec. 2012    Right side  . MI, old June 2012    Heart Attack    Past Surgical History  Procedure Laterality Date  . Appendectomy    . Cholecystectomy    . Tubal ligation    . Breast surgery      Left breast cancer  . Cardiac catheterization  2002  . Popliteal artery angioplasty  11/30/10    Left leg w/ embolectomy, endarterectomy  by Dr.Charles Fields  . Coronary angioplasty with stent placement    . Pr vein bypass graft,aorto-fem-pop    . Femoral-popliteal bypass graft  05/29/2012    Procedure: BYPASS GRAFT FEMORAL-POPLITEAL ARTERY;  Surgeon: Pryor Ochoa, MD;  Location: [facility] OR;  Service: Vascular;  Laterality: Right;  Right Common femoral artery to above knee popliteal artery bypass graft using non-reversed saphenous vein  . Intraoperative arteriogram  05/29/2012     Procedure: INTRA OPERATIVE ARTERIOGRAM;  Surgeon: Pryor Ochoa, MD;  Location: [facility] OR;  Service: Vascular;  Laterality: Right;    History   Social History  . Marital Status: Widowed    Spouse Name: N/A    Number of Children: N/A  . Years of Education: N/A   Occupational History  . Not on file.   Social History Main Topics  . Smoking status: Former Smoker -- 1.50 packs/day for 44 years    Types: Cigarettes    Quit date: 11/30/2010  . Smokeless tobacco: Never Used  . Alcohol Use: No  . Drug Use: No  . Sexual Activity: No   Other Topics Concern  . Not on file   Social History Narrative  . No narrative on file    Current Outpatient Prescriptions on File Prior to Visit  Medication Sig Dispense Refill  . albuterol-ipratropium (COMBIVENT) 18-103 MCG/ACT inhaler Inhale 2 puffs into the lungs every 3 (three) hours as needed. For shortness of breath.      Marland Kitchen amLODipine (NORVASC) 5 MG tablet Take 5 mg by mouth daily.       Marland Kitchen  aspirin EC 81 MG tablet Take 81 mg by mouth daily.      . clindamycin (CLEOCIN) 150 MG capsule Take 150 mg by mouth daily as needed. When patient feels that cellulitis in foot is going to flare up.       . clopidogrel (PLAVIX) 75 MG tablet TAKE 1 TABLET BY MOUTH EVERY DAY  30 tablet  0  . CRESTOR 40 MG tablet TAKE 1 TABLET BY MOUTH EVERY DAY  30 tablet  0  . furosemide (LASIX) 40 MG tablet Take 40 mg by mouth daily.        Marland Kitchen HUMALOG KWIKPEN 100 UNIT/ML injection       . HYDROcodone-acetaminophen (NORCO/VICODIN) 5-325 MG per tablet daily.      . insulin glargine (LANTUS) 100 UNIT/ML injection Inject 35-40 Units into the skin 2 (two) times daily.      . insulin regular (NOVOLIN R,HUMULIN R) 100 units/mL injection Inject into the skin 4 (four) times daily as needed. Per sliding scale.      . levETIRAcetam (KEPPRA) 250 MG tablet TAKE 1 TABLET BY MOUTH EVERY 12 HOURS  60 tablet  0  . metoCLOPramide (REGLAN) 5 MG tablet Take 5 mg by mouth 3 (three) times daily.        . metoprolol tartrate (LOPRESSOR) 25 MG tablet Take 1 tablet (25 mg total) by mouth 2 (two) times daily.  60 tablet  1  . nitroGLYCERIN (NITROSTAT) 0.4 MG SL tablet Place 0.4 mg under the tongue every 5 (five) minutes as needed.      . ondansetron (ZOFRAN) 4 MG tablet Take 4 mg by mouth every 8 (eight) hours as needed. Nausea       . oxyCODONE-acetaminophen (PERCOCET/ROXICET) 5-325 MG per tablet Take 1 tablet by mouth every 4 (four) hours as needed. For pain  30 tablet  0  . pantoprazole (PROTONIX) 40 MG tablet Take 40 mg by mouth daily.        . potassium chloride (KLOR-CON) 20 MEQ packet Take 20 mEq by mouth 2 (two) times daily.      . traMADol (ULTRAM) 50 MG tablet as needed.      Marland Kitchen glimepiride (AMARYL) 4 MG tablet Take 1 tablet (4 mg total) by mouth daily with breakfast.  30 tablet  1  . insulin glargine (LANTUS) 100 UNIT/ML injection Inject 30-45 Units into the skin at bedtime. 45 units at night and 30 in the morning      . potassium chloride SA (K-DUR,KLOR-CON) 20 MEQ tablet Take 1 tablet (20 mEq total) by mouth daily at 12 noon.  30 tablet  1   No current facility-administered medications on file prior to visit.   No Known Allergies  Family History  Problem Relation Age of Onset  . Hypertension Sister   . Diabetes Sister   . Hyperlipidemia Sister   . Hyperlipidemia Sister   . Hyperlipidemia Brother   . Diabetes Brother   . Lung cancer Father   . Cancer Father   . Heart disease Father   . Hypertension Father   . Stomach cancer Sister   . Diabetes Daughter   thyroid probs: none  BP 126/84  Pulse 80  Ht 5\' 2"  (1.575 m)  Wt 219 lb (99.338 kg)  BMI 40.05 kg/m2  SpO2 98%  Review of Systems denies headache, hoarseness, double vision, palpitations, sob, diarrhea, polyuria, myalgias, excessive diaphoresis, numbness, seizure, anxiety, menopausal sxs, easy bruising, and rhinorrhea.  She has acral numbness.  Objective:   Physical Exam VS: see vs page GEN: no distress.  In  wheelchair.   HEAD: head: no deformity.   eyes: no periorbital swelling, no proptosis.   external nose and ears are normal.   mouth: no lesion seen NECK: the thyroid is 2-3 times normal size, with an apparent nodule on the right, approx 2 cm. CHEST WALL: no deformity LUNGS:  Clear to auscultation.   CV: reg rate and rhythm, no murmur. ABD: abdomen is soft, nontender.  no hepatosplenomegaly.  not distended.  no hernia MUSCULOSKELETAL: muscle bulk and strength are grossly normal.  no obvious joint swelling.  gait is normal and steady EXTEMITIES: no deformity.  no ulcer on the feet.  feet are of normal color and temp.  no edema PULSES: dorsalis pedis intact bilat.  no carotid bruit NEURO:  cn 2-12 grossly intact.   readily moves all 4's, but strength is slightly decreased at the RUE and RLE.  sensation is intact to touch on the feet, but decreased from normal.  There is a moderate coarse tremor of the hands.  SKIN:  Normal texture and temperature.  No rash or suspicious lesion is visible.  Not diaphoretic.  Few ecchymoses on the forearms.  NODES:  None palpable at the neck PSYCH: alert, oriented x3.  Does not appear anxious nor depressed.    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  In this setting, euthyroidism should be restored.

## 2013-07-13 NOTE — Patient Instructions (Addendum)
i have ordered for you a treatment pill of radioactive iodine.  Although it is a larger amount of radiation, you will again notice no symptoms from this.  The pill is gone from your body in a few days (during which you should stay away from other people), but takes several months to work.  Therefore, please return here approximately 8 weeks after the treatment.  This treatment has been available for many years, and the only known side-effect is an underactive thyroid.  It is possible that i would eventually prescribe for you a thyroid hormone pill, which is very inexpensive.  You don't have to worry about side-effects of this thyroid hormone pill, because it is the same molecule your thyroid makes.

## 2013-07-16 ENCOUNTER — Telehealth: Payer: Self-pay

## 2013-07-16 ENCOUNTER — Ambulatory Visit (HOSPITAL_COMMUNITY): Payer: Medicare Other

## 2013-07-16 NOTE — Telephone Encounter (Signed)
Pt advvised and states an understanding that she will receive a phone call from [facility] [place] to shcedule appt at [facility]

## 2013-07-16 NOTE — Telephone Encounter (Signed)
Labs faxed to radiology at [facility] [phone]

## 2013-07-16 NOTE — Telephone Encounter (Signed)
[name], [facility] at [facility] called [facility] can not do test based on 2012 lab results, tsh was too low, will schedule at [facility] but will need tsh and 24 hr uptake in order to schedule at [facility]

## 2013-07-16 NOTE — Telephone Encounter (Signed)
The recent blood test was put in scanning pile, but hasn't been scanned yet.   Please retrieve from scanning pile, and fax to radiol scheduling.

## 2013-07-16 NOTE — Telephone Encounter (Signed)
Please call pt, re: "some pill she is supposed to take" / [name]

## 2013-07-16 NOTE — Telephone Encounter (Signed)
Pt has called again, says no one has called her yet. Please call pt! [name]

## 2013-07-21 ENCOUNTER — Telehealth: Payer: Self-pay | Admitting: Endocrinology

## 2013-07-21 NOTE — Telephone Encounter (Signed)
Per [name] pt needs to have 24 hr uptake before appt can be made, please advise

## 2013-07-21 NOTE — Telephone Encounter (Signed)
i believe this was done at [place].

## 2013-07-22 ENCOUNTER — Telehealth: Payer: Self-pay | Admitting: Endocrinology

## 2013-07-22 ENCOUNTER — Ambulatory Visit (INDEPENDENT_AMBULATORY_CARE_PROVIDER_SITE_OTHER): Payer: Medicare Other | Admitting: Ophthalmology

## 2013-07-22 NOTE — Telephone Encounter (Signed)
Per VM - please call pt regarding "iodine test" / Sherri S.

## 2013-07-22 NOTE — Telephone Encounter (Signed)
please call patient: i reviewed your thyroid scan, done at [place]. We need to recheck your thyroid blood tests before we can do the radioactive iodine therapy. Do you want to do here or [place]?

## 2013-07-22 NOTE — Telephone Encounter (Signed)
Per VM - please call pt regarding "Iodine test". CB# [phone] / Sherri S.

## 2013-07-22 NOTE — Telephone Encounter (Signed)
On your desk to review.

## 2013-07-27 ENCOUNTER — Telehealth: Payer: Self-pay

## 2013-07-27 NOTE — Telephone Encounter (Signed)
We retrieved result. Pt needs to redo the blood test before we can do the i-131 rx

## 2013-07-27 NOTE — Telephone Encounter (Signed)
[name]

## 2013-07-27 NOTE — Telephone Encounter (Signed)
Pt advised to pick up order for labs

## 2013-07-27 NOTE — Telephone Encounter (Signed)
please call patient: Does she want to do TFT here or [place]?

## 2013-07-27 NOTE — Telephone Encounter (Signed)
[name] needs to know status  of 24hr uptake

## 2013-07-27 NOTE — Telephone Encounter (Signed)
i printed letter 

## 2013-07-27 NOTE — Telephone Encounter (Signed)
Pt would like to schedule iodine test in [facility]

## 2013-08-09 ENCOUNTER — Telehealth: Payer: Self-pay | Admitting: Endocrinology

## 2013-08-09 ENCOUNTER — Other Ambulatory Visit (HOSPITAL_COMMUNITY): Payer: Self-pay | Admitting: Internal Medicine

## 2013-08-09 NOTE — Telephone Encounter (Signed)
Pt states labs were done in [place] on Thursday, pt advised we have not received labs, and advised to give them a call and have labs faxed to the office

## 2013-08-11 ENCOUNTER — Telehealth: Payer: Self-pay | Admitting: Endocrinology

## 2013-08-11 NOTE — Telephone Encounter (Signed)
Here are more recent blood tests, and nuc med scan (both from [place]).  Please call mary phillips, pcc, and send these to her so she can schedule i-131 therapy at [facility]

## 2013-08-11 NOTE — Telephone Encounter (Signed)
Corrie Dandy I will fax these labs to you

## 2013-08-13 ENCOUNTER — Telehealth: Payer: Self-pay | Admitting: Endocrinology

## 2013-08-13 NOTE — Telephone Encounter (Signed)
Pt advised pcc will cal with an appt for i-131

## 2013-08-16 ENCOUNTER — Encounter: Payer: Self-pay | Admitting: Family

## 2013-08-17 ENCOUNTER — Ambulatory Visit (INDEPENDENT_AMBULATORY_CARE_PROVIDER_SITE_OTHER)
Admission: RE | Admit: 2013-08-17 | Discharge: 2013-08-17 | Disposition: A | Discharge status: Home / Self Care | Payer: Medicare Other | Source: Ambulatory Visit | Attending: Vascular Surgery | Admitting: Vascular Surgery

## 2013-08-17 ENCOUNTER — Ambulatory Visit (HOSPITAL_COMMUNITY)
Admission: RE | Admit: 2013-08-17 | Discharge: 2013-08-17 | Disposition: A | Discharge status: Home / Self Care | Payer: Medicare Other | Source: Ambulatory Visit | Attending: Vascular Surgery | Admitting: Vascular Surgery

## 2013-08-17 ENCOUNTER — Ambulatory Visit (INDEPENDENT_AMBULATORY_CARE_PROVIDER_SITE_OTHER): Payer: Medicare Other | Admitting: Family

## 2013-08-17 ENCOUNTER — Encounter: Payer: Self-pay | Admitting: Family

## 2013-08-17 VITALS — BP 133/57 | HR 83 | Resp 18 | Ht 63.0 in | Wt 222.0 lb

## 2013-08-17 DIAGNOSIS — I739 Peripheral vascular disease, unspecified: Secondary | ICD-10-CM

## 2013-08-17 DIAGNOSIS — I70219 Atherosclerosis of native arteries of extremities with intermittent claudication, unspecified extremity: Secondary | ICD-10-CM

## 2013-08-17 DIAGNOSIS — Z48812 Encounter for surgical aftercare following surgery on the circulatory system: Secondary | ICD-10-CM

## 2013-08-17 DIAGNOSIS — R209 Unspecified disturbances of skin sensation: Secondary | ICD-10-CM

## 2013-08-17 DIAGNOSIS — R5381 Other malaise: Secondary | ICD-10-CM

## 2013-08-17 DIAGNOSIS — M7989 Other specified soft tissue disorders: Secondary | ICD-10-CM

## 2013-08-17 DIAGNOSIS — R200 Anesthesia of skin: Secondary | ICD-10-CM | POA: Insufficient documentation

## 2013-08-17 DIAGNOSIS — R531 Weakness: Secondary | ICD-10-CM

## 2013-08-17 NOTE — Progress Notes (Signed)
[facility] [place] HISTORY AND PHYSICAL -PAD  Previous Bypass Surgery/ Stent Placement: Yes Surgeon:Lawson  History of Present Illness [name] is a 62 y.o. female patient returns for continued followup regarding a right femoral popliteal bypass graft and left SFA stent. She had evidence of some elevated velocity in the mid portion of the vein graft in the past.    Pt. Denies claudication symptoms, has pain in left foot,attributes to neuropathy, and right leg pain hurts no worse with walking; but she does not walk much due to feet and low back pain.  denies non healing ulcers on lower extremities. Had a stroke in 2012 manifested by right leg and arm weakness, has improved about 50% per grand-daughter as she was not able to walk after her stroke. She also has tingling in right hand and lateral asect right leg. Sees [facility] for CAD. Patient reports New Medical or Surgical History: recently diagnosed with thyroid nodules, is scheduled for thyroid ablation; also recently diagnosed with osteoporosis.  Pt Diabetic2: Yes, states her FBS is 70-80 and evenings glucose is about 198. Pt smoker: former smoker, quit 4 years ago  Pt meds include: Statin :Yes Betablocker: Yes ASA: Yes Other anticoagulants/antiplatelets: Plavix  Past Medical History  Diagnosis Date  . GERD (gastroesophageal reflux disease)   . PAD (peripheral artery disease)     Stent, embolectomy, surgery  /     Dr. Darrick Penna, ABI 0.52 right, 0.54 left, February, 2010  . OSA (obstructive sleep apnea)     Dr. Craige Cotta  CPAP started August, 2012  . Systolic and diastolic CHF, chronic   . CAD (coronary artery disease)     June, 2012, DES to RCA, staged DES to circumflex and DES to diagonal with pressure wire to LAD, FFR 0.84  . DM (diabetes mellitus), type 2   . Hypertension   . Morbid obesity   . Hyperlipidemia   . Ejection fraction < 50%     EF 45-50%, echo, June, 2012  . Diastolic dysfunction      grade 2, echo, June, 2012  . Peripheral neuropathy   . Tobacco abuse   . Syncope     vasovagal  . Breast cancer     lumpectomy and radiation 2002  . DVT (deep venous thrombosis)   . Shortness of breath     with exertion  . Stroke Dec. 2012    Right side  . 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 Smoking status: Former Smoker -- 1.50 packs/day for 44 years    Types: Cigarettes    Quit date: 11/30/2010  . Smokeless tobacco: Never Used  . Alcohol Use: No    Family History Family History  Problem Relation Age of Onset  . Hypertension Sister   . Diabetes Sister   . Hyperlipidemia Sister   . Hyperlipidemia Sister   . Hyperlipidemia Brother   . Diabetes Brother   . Lung cancer Father   . Cancer Father   . Heart disease Father   . Hypertension Father   . Stomach cancer Sister   . Diabetes Daughter     Past Surgical History  Procedure Laterality Date  . Appendectomy    . Cholecystectomy    . Tubal ligation    . Breast surgery      Left breast cancer  . Cardiac catheterization  2002  . Popliteal artery angioplasty  11/30/10  Left leg w/ embolectomy, endarterectomy  by Dr.Charles Fields  . Coronary angioplasty with stent placement    . Pr vein bypass graft,aorto-fem-pop    . Femoral-popliteal bypass graft  05/29/2012    Procedure: BYPASS GRAFT FEMORAL-POPLITEAL ARTERY;  Surgeon: Pryor Ochoa, MD;  Location: [facility] OR;  Service: Vascular;  Laterality: Right;  Right Common femoral artery to above knee popliteal artery bypass graft using non-reversed saphenous vein  . Intraoperative arteriogram  05/29/2012    Procedure: INTRA OPERATIVE ARTERIOGRAM;  Surgeon: Pryor Ochoa, MD;  Location: [facility] OR;  Service: Vascular;  Laterality: Right;    No Known Allergies  Current Outpatient Prescriptions  Medication Sig Dispense Refill  . albuterol-ipratropium (COMBIVENT) 18-103 MCG/ACT inhaler Inhale 2 puffs into the lungs every 3  (three) hours as needed. For shortness of breath.      Marland Kitchen alendronate (FOSAMAX) 70 MG tablet       . amLODipine (NORVASC) 5 MG tablet Take 5 mg by mouth daily.       Marland Kitchen aspirin EC 81 MG tablet Take 81 mg by mouth daily.      Marland Kitchen atorvastatin (LIPITOR) 20 MG tablet       . clindamycin (CLEOCIN) 150 MG capsule Take 150 mg by mouth daily as needed. When patient feels that cellulitis in foot is going to flare up.       . clopidogrel (PLAVIX) 75 MG tablet TAKE 1 TABLET BY MOUTH EVERY DAY  30 tablet  0  . CRESTOR 40 MG tablet TAKE 1 TABLET BY MOUTH EVERY DAY  30 tablet  0  . furosemide (LASIX) 40 MG tablet Take 40 mg by mouth daily.        Marland Kitchen HUMALOG KWIKPEN 100 UNIT/ML injection       . HYDROcodone-acetaminophen (NORCO/VICODIN) 5-325 MG per tablet daily.      . insulin glargine (LANTUS) 100 UNIT/ML injection Inject 35-40 Units into the skin 2 (two) times daily.      . insulin regular (NOVOLIN R,HUMULIN R) 100 units/mL injection Inject into the skin 4 (four) times daily as needed. Per sliding scale.      . levETIRAcetam (KEPPRA) 250 MG tablet TAKE 1 TABLET BY MOUTH EVERY 12 HOURS  60 tablet  0  . metoCLOPramide (REGLAN) 5 MG tablet Take 5 mg by mouth 3 (three) times daily.       . metoprolol succinate (TOPROL-XL) 25 MG 24 hr tablet       . metoprolol tartrate (LOPRESSOR) 25 MG tablet Take 1 tablet (25 mg total) by mouth 2 (two) times daily.  60 tablet  1  . nitroGLYCERIN (NITROSTAT) 0.4 MG SL tablet Place 0.4 mg under the tongue every 5 (five) minutes as needed.      . ondansetron (ZOFRAN) 4 MG tablet Take 4 mg by mouth every 8 (eight) hours as needed. Nausea       . oxyCODONE-acetaminophen (PERCOCET/ROXICET) 5-325 MG per tablet Take 1 tablet by mouth every 4 (four) hours as needed. For pain  30 tablet  0  . pantoprazole (PROTONIX) 40 MG tablet Take 40 mg by mouth daily.        . potassium chloride (KLOR-CON) 20 MEQ packet Take 20 mEq by mouth 2 (two) times daily.      . traMADol (ULTRAM) 50 MG tablet as  needed.      Marland Kitchen glimepiride (AMARYL) 4 MG tablet Take 1 tablet (4 mg total) by mouth daily with breakfast.  30 tablet  1  .  insulin glargine (LANTUS) 100 UNIT/ML injection Inject 30-45 Units into the skin at bedtime. 45 units at night and 30 in the morning      . potassium chloride SA (K-DUR,KLOR-CON) 20 MEQ tablet Take 1 tablet (20 mEq total) by mouth daily at 12 noon.  30 tablet  1   No current facility-administered medications for this visit.  No longer taking Crestor  ROS: [x]  Positive   [ ]  Denies  General:[ ]  Weight loss,  [ ]  Weight gain, [ ]  Fever, [ ]  chills Neurologic: [ ]  Dizziness, [ ]  Blackouts, [ ]  Seizure [ ]  Stroke, [ ]  "Mini stroke", [ ]  Slurred speech, [ ]  Temporary blindness;  [ ] weakness, [ ]  Hoarseness Cardiac: [ ]  Chest pain/pressure, [ ]  Shortness of breath at rest [ ]  Shortness of breath with exertion,  [ ]   Atrial fibrillation or irregular heartbeat Vascular:[ ]  Pain in legs with walking, [ ]  Pain in legs at rest ,[ ]  Pain in legs at night,  [ ]   Non-healing ulcer, [ ]  Blood clot in vein/DVT,   Pulmonary: [ ]  Home oxygen, [ ]   Productive cough, [ ]  Coughing up blood,  [ ]  Asthma,  [ ]  Wheezing Musculoskeletal:  [ ]  Arthritis, [ ]  Low back pain,  [ ]  Joint pain Hematologic:[ ]  Easy Bruising, [ ]  Anemia; [ ]  Hepatitis Gastrointestinal: [ ]  Blood in stool,  [ ]  Gastroesophageal Reflux, [ ]  Trouble swallowing Urinary: [ ]  chronic Kidney disease, [ ]  on HD, [ ]  Burning with urination, [ ]  Frequent urination, [ ]  Difficulty urinating;  Skin: [ ]  Rashes, [ ]  Wounds     Physical Examination  Filed Vitals:   08/17/13 1419  BP: 133/57  Pulse: 83  Resp: 18   Filed Weights   08/17/13 1419  Weight: 222 lb (100.699 kg)   Body mass index is 39.34 kg/(m^2).   General: A&O x 3, WDWN, obese, in wheelchair. Gait: in wheelchair Eyes: PERRLA, Pulmonary: CTAB, without wheezes , rales or rhonchi Cardiac: regular Rythm , without murmur          Carotid Bruits Left  Right   Negative Negative  Aorta: not palpable Radial pulses: not palpable on right, right brachial is 2+ palpable; left radial is 2+ palpable                           VASCULAR EXAM: Extremities without ischemic changes other than mild dependent rubor in left foot. without Gangrene; without open wounds.                                                                                                          LE Pulses LEFT RIGHT       FEMORAL  not palpable  not palpable        POPLITEAL  not palpable   not palpable       POSTERIOR TIBIAL  not palpable   not palpable        DORSALIS PEDIS  ANTERIOR TIBIAL not palpable  not palpable    Abdomen: soft, NT, no masses. Skin: no rashes, no ulcers noted. Musculoskeletal: mild muscle wasting or atrophy in right foot.  Neurologic: A&O X 3; Appropriate Affect ; SENSATION: normal; MOTOR FUNCTION:  moving all extremities equally, motor strength 4/5 in right UE, 5/5 in left UE, 3/5 in right LE, 4/5 in left LE. Speech is fluent/normal. CN 2-12 intact. Right foot is adducted when she stands and starts to walk.    Non-Invasive Vascular Imaging: DATE: 08/17/2013 ABI: RIGHT 0.77, decreased from 0.84 on 02/09/13, Waveforms: triphasic;  LEFT 0.47, decreased from 0.54 on 02/09/13, Waveforms: monophasic DUPLEX SCAN OF BYPASS: significant stenosis left native SFA inflow 3.3 cm proximal to stent, 462 cm/sec velocity. Occluded left PT trunk. All left LE  Waveforms are monophasic. Widely patent right fem-pop graft with elevated velocities and mild disease at the proximal anastomosis and CFA inflow. All right LE waveforms are triphasic.  ASSESSMENT: SHANIKA LEVINGS is a 62 y.o. female who presents status post right femoral popliteal bypass graft and left SFA stent.with: significant stenosis left native SFA inflow 3.3 cm proximal to stent, 462 cm/sec velocity. Occluded left PT trunk. Widely patent right fem-pop graft with elevated velocities and mild disease at  the proximal anastomosis and CFA inflow. All right LE waveforms are triphasic.  PLAN:  I discussed in depth with the patient the nature of atherosclerosis, and emphasized the importance of maximal medical management including strict control of blood pressure, blood glucose, and lipid levels, obtaining regular exercise, and continued cessation of smoking.  The patient is aware that without maximal medical management the underlying atherosclerotic disease process will progress, limiting the benefit of any interventions. Based on the patient's vascular studies and examination, and after discussing with Dr. Hart Rochester, pt will return to clinic in 6  months for LE Duplex and ABI's. Will also add bilateral carotid Duplex in 6 months since she has hx of CVA in 2012.  The patient was given information about PAD including signs, symptoms, treatment, what symptoms should prompt the patient to seek immediate medical care, and risk reduction measures to take. Patient was encouraged to perform chair exercises several times/day: demonstrated ankle rotations, leg lifts, ankle flexions, knee flexions. The patient was also given information about stroke prevention and what symptoms should prompt her to seek immediate medical attention.  Charisse March, RN, MSN, FNP-C [facility] Phone: [phone]  Clinic MD: Hart Rochester  08/17/2013 2:34 PM   VASCULAR QUALITY INITIATIVE FOLLOW UP DATA:  Current smoker: [  ] yes  Arly.Keller  ] no  Living status: Arly.Keller  ]  Home  [  ] Nursing home  [  ] Homeless    MEDS:  ASA [ X ] yes  [  ] no- [  ] medical reason  [  ] non compliant  STATIN  Arly.Keller  ] yes  [  ] no- [  ] medical reason  [  ] non compliant  Beta blocker Arly.Keller  ] yes  [  ] no- [  ] medical reason  [  ] non compliant  ACE inhibitor [  ] yes  Arly.Keller  ] no- [ X ] medical reason  [  ] non compliant  P2Y12 Antagonist [  ] none  [ X ] clopidogrel-Plavix  [  ] ticlopidine-Ticlid   [  ]  prasugrel-Effient  [  ] ticagrelor- Brilinta    Anticoagulant Arly.Keller  ] None  [  ]  warfarin  [  ] rivaroxaban-Xarelto [  ] dabigatran- Pradaxa  Ambulation: [  ] Amb  [  ] Amb with assistance  [ X ] wheelchair  [  ] bedridden  Ipsilateral Sx: [ X ] none   [  ] claudication  [  ] rest pain  [  ] tissue loss  Current Patency: [ X ] primary  [  ] primary-assisted  [  ] secondary  [  ] occluded  Patency judged by: [ X ] doppler  [  ] palp graft pulse  [  ] palp distal pulse   Arly.Keller  ] ABI increase > 15%   [  ] Duplex  If occluded, when-   Ipsilateral ABI: 0.77  Ipsilateral TBI: 0.59  Infection: [ X ] none  [  ] cellulitis  [  ] deep abscess  [  ] infection of artery or graft  Bypass revision: Arly.Keller  ] no  [  ] yes- [  ] surg  [ X ] catheter based  [  ] both    Date:   Thrombectomy/ lysis/ revision:  Arly.Keller  ] no  [  ] yes- [  ] surg  [  ] catheter based  [  ] both    Date:   Major amputation: [ X ] no  [  ] minor amp  [  ] BKA  [  ] AKA   Date:

## 2013-08-17 NOTE — Patient Instructions (Addendum)
Peripheral Vascular Disease Peripheral Vascular Disease (PVD), also called Peripheral Arterial Disease (PAD), is a circulation problem caused by cholesterol (atherosclerotic plaque) deposits in the arteries. PVD commonly occurs in the lower extremities (legs) but it can occur in other areas of the body, such as your arms. The cholesterol buildup in the arteries reduces blood flow which can cause pain and other serious problems. The presence of PVD can place a person at risk for Coronary Artery Disease (CAD).  CAUSES  Causes of PVD can be many. It is usually associated with more than one risk factor such as:   High Cholesterol.  Smoking.  Diabetes.  Lack of exercise or inactivity.  High blood pressure (hypertension).  Obesity.  Family history. SYMPTOMS   When the lower extremities are affected, patients with PVD may experience:  Leg pain with exertion or physical activity. This is called INTERMITTENT CLAUDICATION. This may present as cramping or numbness with physical activity. The location of the pain is associated with the level of blockage. For example, blockage at the abdominal level (distal abdominal aorta) may result in buttock or hip pain. Lower leg arterial blockage may result in calf pain.  As PVD becomes more severe, pain can develop with less physical activity.  In people with severe PVD, leg pain may occur at rest.  Other PVD signs and symptoms:  Leg numbness or weakness.  Coldness in the affected leg or foot, especially when compared to the other leg.  A change in leg color.  Patients with significant PVD are more prone to ulcers or sores on toes, feet or legs. These may take longer to heal or may reoccur. The ulcers or sores can become infected.  If signs and symptoms of PVD are ignored, gangrene may occur. This can result in the loss of toes or loss of an entire limb.  Not all leg pain is related to PVD. Other medical conditions can cause leg pain such  as:  Blood clots (embolism) or Deep Vein Thrombosis.  Inflammation of the blood vessels (vasculitis).  Spinal stenosis. DIAGNOSIS  Diagnosis of PVD can involve several different types of tests. These can include:  Pulse Volume Recording Method (PVR). This test is simple, painless and does not involve the use of X-rays. PVR involves measuring and comparing the blood pressure in the arms and legs. An ABI (Ankle-Brachial Index) is calculated. The normal ratio of blood pressures is 1. As this number becomes smaller, it indicates more severe disease.  < 0.95  indicates significant narrowing in one or more leg vessels.  <0.8 there will usually be pain in the foot, leg or buttock with exercise.  <0.4 will usually have pain in the legs at rest.  <0.25  usually indicates limb threatening PVD.  Doppler detection of pulses in the legs. This test is painless and checks to see if you have a pulses in your legs/feet.  A dye or contrast material (a substance that highlights the blood vessels so they show up on x-ray) may be given to help your caregiver better see the arteries for the following tests. The dye is eliminated from your body by the kidney's. Your caregiver may order blood work to check your kidney function and other laboratory values before the following tests are performed:  Magnetic Resonance Angiography (MRA). An MRA is a picture study of the blood vessels and arteries. The MRA machine uses a large magnet to produce images of the blood vessels.  Computed Tomography Angiography (CTA). A CTA is a   specialized x-ray that looks at how the blood flows in your blood vessels. An IV may be inserted into your arm so contrast dye can be injected.  Angiogram. Is a procedure that uses x-rays to look at your blood vessels. This procedure is minimally invasive, meaning a small incision (cut) is made in your groin. A small tube (catheter) is then inserted into the artery of your groin. The catheter is  guided to the blood vessel or artery your caregiver wants to examine. Contrast dye is injected into the catheter. X-rays are then taken of the blood vessel or artery. After the images are obtained, the catheter is taken out. TREATMENT  Treatment of PVD involves many interventions which may include:  Lifestyle changes:  Quitting smoking.  Exercise.  Following a low fat, low cholesterol diet.  Control of diabetes.  Foot care is very important to the PVD patient. Good foot care can help prevent infection.  Medication:  Cholesterol-lowering medicine.  Blood pressure medicine.  Anti-platelet drugs.  Certain medicines may reduce symptoms of Intermittent Claudication.  Interventional/Surgical options:  Angioplasty. An Angioplasty is a procedure that inflates a balloon in the blocked artery. This opens the blocked artery to improve blood flow.  Stent Implant. A wire mesh tube (stent) is placed in the artery. The stent expands and stays in place, allowing the artery to remain open.  Peripheral Bypass Surgery. This is a surgical procedure that reroutes the blood around a blocked artery to help improve blood flow. This type of procedure may be performed if Angioplasty or stent implants are not an option. SEEK IMMEDIATE MEDICAL CARE IF:   You develop pain or numbness in your arms or legs.  Your arm or leg turns cold, becomes blue in color.  You develop redness, warmth, swelling and pain in your arms or legs. MAKE SURE YOU:   Understand these instructions.  Will watch your condition.  Will get help right away if you are not doing well or get worse. Document Released: 12/05/2004 Document Revised: 01/20/2012 Document Reviewed: 11/01/2008 [facility] Patient Information 2014 [place], [place].   Cuide la salud de su corazn y sus vasos sanguneos (Heart Disease Prevention) Las enfermedades del corazn pueden conducir a infartos y a ictus. Esto puede conducir a Musician. Las  enfermedades del corazn pueden heredarse y estar causadas por el estilo de vida que lleve. Usted puede hacer mucho para mantener saludables a su corazn y sus vasos sanguneos.  QU DEBERA HACER TODOS LOS DAS PARA MANTENER LA BUENA SALUD?  No fumar.  Siga un plan de dieta saludable que pueda elaborar con el profesional que lo asiste o con el [place].  Realice alguna actividad fsica durante 30 minutos, la [employer]. Pregntele a su mdico cules son las actividades ms recomendables para usted.  Limite la cantidad de alcohol que bebe.  Pedir a sus familiares y [employer] ayuden con un estilo de vida saludable. DE QU MODO LA ENFERMEDAD CARDACA PRODUCE PRESIN ARTERIAL ELEVADA?  Los vasos sanguneos que se han estrechado dejan una pequea abertura para que la [place]. Es como abrir Financial risk analyst y Pharmacologist el pulgar sobre la abertura La abertura pequea hace que el agua salga con ms presin. Del mismo modo, los vasos sanguneos que se han estrechado conducen a un aumento de la presin arterial. Otros factores, como problemas renales y el sobrepeso, tambin pueden conducir a un aumento de la presin arterial.  Si tiene presin alta, se le podr aconsejar que tome  un medicamento para la presin [employer]. Algunos tipos medicamento para la presin arterial mantendrn sus riones sanos.  Muchas personas diabticas tambin tienen la presin arterial elevada. Si usted tiene Engineer, petroleum, en la vista o en los riones debido a la diabetes, la presin arterial elevada puede empeorarlos. CMO SE OBSTRUYEN LOS VASOS SANGUNEOS?  El colesterol es una sustancia que forma el organismo y que se [place] para muchas funciones importantes. Tambin se encuentra en algunos alimentos de origen animal. Cuando el colesterol es [place], puede pegarse al interior de los grandes vasos sanguneos y los hace ms angostos o los [place]. Este trastorno se denomina  aterosclerosis.  Los vasos sanguneos que se han estrechado y bloqueado hacen que sea ms difcil el paso de la sangre hacia otras partes importantes del organismo. Esto puede causarle problemas como:  Dolor en el pecho (angina). Angina puede causar dolor temporal en el pecho, [place]raz[place], hombros o espalda. Puede sentir el dolor cuando su corazn late rpido, como cuando hace ejercicios. El dolor puede desaparecer con el descanso. Tambin podr sentirse muy dbil y sudoroso.  Ataques cardacos. Un infarto se produce cuando un vaso sanguneo que se encuentra en el corazn o cerca de ste, se obstruye. No llega suficiente sangre al corazn. Durante el [place], puede sentir dolor en el pecho, brazos, hombros, o espalda junto con nuseas, indigestin, debilidad extrema y sudoracin. QU PUEDO HACER PARA PREVENIR LA ENFERMEDAD CARDACA?   Mantenga la presin arterial bajo control segn se lo indique el mdico.  Mantenga el colesterol bajo control. Contrlelo al [employer] vez al ao. El nivel objetivo de colesterol para la mayor parte de las personas es:  Nivel total de colesterol en sangre: por debajo de 200.  LDL colesterol (malo) por debajo de 100.  HDL colesterol (bueno) arriba de 40 en los hombres y [place] de 50 en las mujeres.  Triglicridos (otro tipo de grasa en la sangre) por debajo de 150.  Practique actividad fsica como parte de su rutina diaria. Pregntele a su mdico cules son las actividades ms recomendables para usted.  Compruebe que los alimentos que consume son "saludables para el corazn."  Incluya alimentos ricos en fibra, como salvado, avena, panes integrales, y cereales.  Limite los alimentos fritos y evite frerlos en grasas saturadas. Esto incluye alimentos como carnes, [place], productos de [place], [place] y aceites de coco o palma.  Evite los alimentos [employer], fiambres, snacks salados y comidas rpidas.  Coma ms frutas y  vegetales.  Beba menos alcohol.  Pierda peso como le indic el profesional que lo asiste.  Si fuma, deje de fumar. El profesional que lo asiste lo ayudar.  Consulte con el profesional si debe tomar una [employer]. Algunos estudios han demostrado que tomar aspirina todos los [address] puede ayudarlo a reducir el riesgo de sufrir enfermedad cardaca e ictus.  Tome la medicacin como se le indic. CULES SON LAS SEALES DE ALERTA DE UN INFARTO? Usted puede tener una o ms de las siguientes seales de alerta:  Dolor o molestias en el pecho.  Dolor o [employer], [place], [place] o cuello.  Indigestin o dolor de [place].  Falta de aire.  Sudoracin.  Siente nuseas o vmitos.  Mareos.  Tambin puede ocurrir que no tenga ninguna seal de [place], o que aparezca y se vaya. PARA MS INFORMACIN Para saber ms acerca de lo problemas cardacos y prevencin de ictus, visite el sitio web de  la [email or web address] Document Released: 08/25/2009 Document Revised: 04/28/2012 [facility] Patient Information 2014 [place], [place].

## 2013-08-18 NOTE — Addendum Note (Signed)
Addended by: Adria Dill L on: 08/18/2013 12:32 PM   Modules accepted: Orders

## 2013-08-24 ENCOUNTER — Encounter: Payer: Self-pay | Admitting: Endocrinology

## 2013-09-01 ENCOUNTER — Encounter (HOSPITAL_COMMUNITY)
Admission: RE | Admit: 2013-09-01 | Discharge: 2013-09-01 | Disposition: A | Discharge status: Home / Self Care | Payer: Medicare Other | Source: Ambulatory Visit | Attending: Endocrinology | Admitting: Endocrinology

## 2013-09-01 ENCOUNTER — Encounter (HOSPITAL_COMMUNITY): Payer: Self-pay

## 2013-09-01 DIAGNOSIS — E0590 Thyrotoxicosis, unspecified without thyrotoxic crisis or storm: Secondary | ICD-10-CM | POA: Insufficient documentation

## 2013-09-01 MED ORDER — SODIUM IODIDE I 131 CAPSULE
35.7000 | Freq: Once | INTRAVENOUS | Status: AC | PRN
  Administered 2013-09-01: 35.7 via ORAL

## 2014-01-05 ENCOUNTER — Ambulatory Visit: Payer: Self-pay

## 2014-01-10 ENCOUNTER — Ambulatory Visit: Payer: Self-pay | Admitting: Podiatry

## 2014-02-15 ENCOUNTER — Encounter: Payer: Self-pay | Admitting: Family

## 2014-02-16 ENCOUNTER — Encounter: Payer: Self-pay | Admitting: Family

## 2014-02-16 ENCOUNTER — Ambulatory Visit (INDEPENDENT_AMBULATORY_CARE_PROVIDER_SITE_OTHER)
Admission: RE | Admit: 2014-02-16 | Discharge: 2014-02-16 | Disposition: A | Discharge status: Home / Self Care | Payer: Medicare Other | Source: Ambulatory Visit | Attending: Family | Admitting: Family

## 2014-02-16 ENCOUNTER — Other Ambulatory Visit: Payer: Self-pay | Admitting: Family

## 2014-02-16 ENCOUNTER — Ambulatory Visit (INDEPENDENT_AMBULATORY_CARE_PROVIDER_SITE_OTHER): Payer: Medicare Other | Admitting: Family

## 2014-02-16 ENCOUNTER — Ambulatory Visit (HOSPITAL_COMMUNITY)
Admission: RE | Admit: 2014-02-16 | Discharge: 2014-02-16 | Disposition: A | Discharge status: Home / Self Care | Payer: Medicare Other | Source: Ambulatory Visit | Attending: Family | Admitting: Family

## 2014-02-16 VITALS — BP 159/91 | HR 102 | Resp 18 | Ht 63.0 in | Wt 211.0 lb

## 2014-02-16 DIAGNOSIS — I70219 Atherosclerosis of native arteries of extremities with intermittent claudication, unspecified extremity: Secondary | ICD-10-CM

## 2014-02-16 DIAGNOSIS — R200 Anesthesia of skin: Secondary | ICD-10-CM

## 2014-02-16 DIAGNOSIS — R202 Paresthesia of skin: Secondary | ICD-10-CM

## 2014-02-16 DIAGNOSIS — I6529 Occlusion and stenosis of unspecified carotid artery: Secondary | ICD-10-CM

## 2014-02-16 DIAGNOSIS — Z8673 Personal history of transient ischemic attack (TIA), and cerebral infarction without residual deficits: Secondary | ICD-10-CM

## 2014-02-16 DIAGNOSIS — M7989 Other specified soft tissue disorders: Secondary | ICD-10-CM

## 2014-02-16 DIAGNOSIS — I739 Peripheral vascular disease, unspecified: Secondary | ICD-10-CM

## 2014-02-16 DIAGNOSIS — R209 Unspecified disturbances of skin sensation: Secondary | ICD-10-CM | POA: Insufficient documentation

## 2014-02-16 DIAGNOSIS — R531 Weakness: Secondary | ICD-10-CM

## 2014-02-16 DIAGNOSIS — Z48812 Encounter for surgical aftercare following surgery on the circulatory system: Secondary | ICD-10-CM

## 2014-02-16 DIAGNOSIS — R29898 Other symptoms and signs involving the musculoskeletal system: Secondary | ICD-10-CM | POA: Insufficient documentation

## 2014-02-16 NOTE — Patient Instructions (Signed)
Peripheral Vascular Disease Peripheral Vascular Disease (PVD), also called Peripheral Arterial Disease (PAD), is a circulation problem caused by cholesterol (atherosclerotic plaque) deposits in the arteries. PVD commonly occurs in the lower extremities (legs) but it can occur in other areas of the body, such as your arms. The cholesterol buildup in the arteries reduces blood flow which can cause pain and other serious problems. The presence of PVD can place a person at risk for Coronary Artery Disease (CAD).  CAUSES  Causes of PVD can be many. It is usually associated with more than one risk factor such as:   High Cholesterol.  Smoking.  Diabetes.  Lack of exercise or inactivity.  High blood pressure (hypertension).  Obesity.  Family history. SYMPTOMS   When the lower extremities are affected, patients with PVD may experience:  Leg pain with exertion or physical activity. This is called INTERMITTENT CLAUDICATION. This may present as cramping or numbness with physical activity. The location of the pain is associated with the level of blockage. For example, blockage at the abdominal level (distal abdominal aorta) may result in buttock or hip pain. Lower leg arterial blockage may result in calf pain.  As PVD becomes more severe, pain can develop with less physical activity.  In people with severe PVD, leg pain may occur at rest.  Other PVD signs and symptoms:  Leg numbness or weakness.  Coldness in the affected leg or foot, especially when compared to the other leg.  A change in leg color.  Patients with significant PVD are more prone to ulcers or sores on toes, feet or legs. These may take longer to heal or may reoccur. The ulcers or sores can become infected.  If signs and symptoms of PVD are ignored, gangrene may occur. This can result in the loss of toes or loss of an entire limb.  Not all leg pain is related to PVD. Other medical conditions can cause leg pain such  as:  Blood clots (embolism) or Deep Vein Thrombosis.  Inflammation of the blood vessels (vasculitis).  Spinal stenosis. DIAGNOSIS  Diagnosis of PVD can involve several different types of tests. These can include:  Pulse Volume Recording Method (PVR). This test is simple, painless and does not involve the use of X-rays. PVR involves measuring and comparing the blood pressure in the arms and legs. An ABI (Ankle-Brachial Index) is calculated. The normal ratio of blood pressures is 1. As this number becomes smaller, it indicates more severe disease.  < 0.95  indicates significant narrowing in one or more leg vessels.  <0.8 there will usually be pain in the foot, leg or buttock with exercise.  <0.4 will usually have pain in the legs at rest.  <0.25  usually indicates limb threatening PVD.  Doppler detection of pulses in the legs. This test is painless and checks to see if you have a pulses in your legs/feet.  A dye or contrast material (a substance that highlights the blood vessels so they show up on x-ray) may be given to help your caregiver better see the arteries for the following tests. The dye is eliminated from your body by the kidney's. Your caregiver may order blood work to check your kidney function and other laboratory values before the following tests are performed:  Magnetic Resonance Angiography (MRA). An MRA is a picture study of the blood vessels and arteries. The MRA machine uses a large magnet to produce images of the blood vessels.  Computed Tomography Angiography (CTA). A CTA is a   specialized x-ray that looks at how the blood flows in your blood vessels. An IV may be inserted into your arm so contrast dye can be injected.  Angiogram. Is a procedure that uses x-rays to look at your blood vessels. This procedure is minimally invasive, meaning a small incision (cut) is made in your groin. A small tube (catheter) is then inserted into the artery of your groin. The catheter is  guided to the blood vessel or artery your caregiver wants to examine. Contrast dye is injected into the catheter. X-rays are then taken of the blood vessel or artery. After the images are obtained, the catheter is taken out. TREATMENT  Treatment of PVD involves many interventions which may include:  Lifestyle changes:  Quitting smoking.  Exercise.  Following a low fat, low cholesterol diet.  Control of diabetes.  Foot care is very important to the PVD patient. Good foot care can help prevent infection.  Medication:  Cholesterol-lowering medicine.  Blood pressure medicine.  Anti-platelet drugs.  Certain medicines may reduce symptoms of Intermittent Claudication.  Interventional/Surgical options:  Angioplasty. An Angioplasty is a procedure that inflates a balloon in the blocked artery. This opens the blocked artery to improve blood flow.  Stent Implant. A wire mesh tube (stent) is placed in the artery. The stent expands and stays in place, allowing the artery to remain open.  Peripheral Bypass Surgery. This is a surgical procedure that reroutes the blood around a blocked artery to help improve blood flow. This type of procedure may be performed if Angioplasty or stent implants are not an option. SEEK IMMEDIATE MEDICAL CARE IF:   You develop pain or numbness in your arms or legs.  Your arm or leg turns cold, becomes blue in color.  You develop redness, warmth, swelling and pain in your arms or legs. MAKE SURE YOU:   Understand these instructions.  Will watch your condition.  Will get help right away if you are not doing well or get worse. Document Released: 12/05/2004 Document Revised: 01/20/2012 Document Reviewed: 11/01/2008 [employer] Patient Information 2014 [employer].  

## 2014-02-16 NOTE — Progress Notes (Signed)
[facility]  HISTORY AND PHYSICAL   MRN : [id number]  History of Present Illness:   [name] is a 63 y.o. female patient of Dr. Kellie Simmering who returns for continued followup regarding a right femoral popliteal bypass graft on 05/29/2012 and left SFA stent on 01/12/2010, left popliteal/tibioperoneal trunk atherectomy w/percutaneous transluminal angioplasty 11/30/2010,  and also for carotid Duplex since she had a CVA in 2012.  She had evidence of some elevated velocity in the mid portion of the vein graft in the past.  Pt. Denies claudication symptoms, has pain in left foot,attributes to neuropathy, and right leg pain hurts no worse with walking; but she does not walk much due to feet and low back pain.  denies non healing ulcers on lower extremities.  Had a stroke in 2012 manifested by right leg and arm weakness, has improved about 50% per grand-daughter as she was not able to walk after her stroke. She also has tingling in right hand and lateral asect right leg.  [facility] for CAD.  Patient reports New Medical or Surgical History: recently diagnosed with thyroid nodules, is scheduled for thyroid ablation; also recently diagnosed with osteoporosis.  Pt Diabetic: Yes, states her blood sugar is not over 200. Pt smoker: former smoker, quit in 2011  Pt meds include:  Statin :Yes  Betablocker: Yes  ASA: Yes  Other anticoagulants/antiplatelets: Plavix  Transportation is an issue for her, has not made a podiatrist appointment for this reason. Has healing ulcer on dorsal aspect right second toe; pt reports that ulcer improved on antibiotic prescribed by her PCP. She walks very little, uses a walker, has intermittent radiculopathy pain bilaterally with walking.   Current Outpatient Prescriptions  Medication Sig Dispense Refill  . albuterol-ipratropium (COMBIVENT) 18-103 MCG/ACT inhaler Inhale 2 puffs into the lungs every 3 (three) hours as needed. For  shortness of breath.      Marland Kitchen alendronate (FOSAMAX) 70 MG tablet       . cephALEXin (KEFLEX) 500 MG capsule Take 500 mg by mouth 2 (two) times daily.      . clopidogrel (PLAVIX) 75 MG tablet TAKE 1 TABLET BY MOUTH EVERY DAY  30 tablet  0  . furosemide (LASIX) 40 MG tablet Take 40 mg by mouth daily.        Marland Kitchen glimepiride (AMARYL) 2 MG tablet Take 2 mg by mouth 2 (two) times daily.      Marland Kitchen HUMALOG KWIKPEN 100 UNIT/ML injection       . HYDROcodone-acetaminophen (NORCO/VICODIN) 5-325 MG per tablet daily.      . insulin glargine (LANTUS) 100 UNIT/ML injection Inject 35-40 Units into the skin 2 (two) times daily.      . methocarbamol (ROBAXIN) 500 MG tablet Take 500 mg by mouth 2 (two) times daily.      . metoCLOPramide (REGLAN) 5 MG tablet Take 5 mg by mouth 3 (three) times daily.       . metoprolol succinate (TOPROL-XL) 25 MG 24 hr tablet       . nitroGLYCERIN (NITROSTAT) 0.4 MG SL tablet Place 0.4 mg under the tongue every 5 (five) minutes as needed.      . pantoprazole (PROTONIX) 40 MG tablet Take 40 mg by mouth daily.        Marland Kitchen sulfamethoxazole-trimethoprim (BACTRIM DS) 800-160 MG per tablet Take 1 tablet by mouth 2 (two) times daily.      . traMADol (ULTRAM) 50 MG tablet as needed.      Marland Kitchen  amLODipine (NORVASC) 5 MG tablet Take 5 mg by mouth daily.       Marland Kitchen aspirin EC 81 MG tablet Take 81 mg by mouth daily.      Marland Kitchen atorvastatin (LIPITOR) 20 MG tablet 40 mg daily.       . clindamycin (CLEOCIN) 150 MG capsule Take 150 mg by mouth daily as needed. When patient feels that cellulitis in foot is going to flare up.       . CRESTOR 40 MG tablet TAKE 1 TABLET BY MOUTH EVERY DAY  30 tablet  0  . glimepiride (AMARYL) 4 MG tablet Take 1 tablet (4 mg total) by mouth daily with breakfast.  30 tablet  1  . insulin glargine (LANTUS) 100 UNIT/ML injection Inject 30-45 Units into the skin at bedtime. 45 units at night and 30 in the morning      . insulin regular (NOVOLIN R,HUMULIN R) 100 units/mL injection Inject into  the skin 4 (four) times daily as needed. Per sliding scale.      . levETIRAcetam (KEPPRA) 250 MG tablet TAKE 1 TABLET BY MOUTH EVERY 12 HOURS  60 tablet  0  . metoprolol tartrate (LOPRESSOR) 25 MG tablet Take 1 tablet (25 mg total) by mouth 2 (two) times daily.  60 tablet  1  . ondansetron (ZOFRAN) 4 MG tablet Take 4 mg by mouth every 8 (eight) hours as needed. Nausea       . oxyCODONE-acetaminophen (PERCOCET/ROXICET) 5-325 MG per tablet Take 1 tablet by mouth every 4 (four) hours as needed. For pain  30 tablet  0  . potassium chloride (KLOR-CON) 20 MEQ packet Take 20 mEq by mouth 2 (two) times daily.      . potassium chloride SA (K-DUR,KLOR-CON) 20 MEQ tablet Take 1 tablet (20 mEq total) by mouth daily at 12 noon.  30 tablet  1   No current facility-administered medications for this visit.     Past Medical History  Diagnosis Date  . GERD (gastroesophageal reflux disease)   . PAD (peripheral artery disease)     Stent, embolectomy, surgery  /     Dr. Oneida Alar, ABI 0.52 right, 0.54 left, February, 2010  . OSA (obstructive sleep apnea)     Dr. Halford Chessman  CPAP started August, 2012  . Systolic and diastolic CHF, chronic   . CAD (coronary artery disease)     June, 2012, DES to RCA, staged DES to circumflex and DES to diagonal with pressure wire to LAD, FFR 0.84  . DM (diabetes mellitus), type 2   . Hypertension   . Morbid obesity   . Hyperlipidemia   . Ejection fraction < 50%     EF 45-50%, echo, June, 2012  . Diastolic dysfunction     grade 2, echo, June, 2012  . Peripheral neuropathy   . Tobacco abuse   . Syncope     vasovagal  . DVT (deep venous thrombosis)   . Shortness of breath     with exertion  . Stroke Dec. 2012    Right side  . 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 Breast cancer 2002    lumpectomy and radiation LEFT    Past Surgical History  Procedure Laterality Date  . Appendectomy    . Cholecystectomy    . Tubal ligation    . Breast surgery      Left breast cancer  .  Cardiac catheterization  2002  . Popliteal artery angioplasty  11/30/10  Left leg w/ embolectomy, endarterectomy  by Springer  . Coronary angioplasty with stent placement    . Pr vein bypass graft,aorto-fem-pop    . Femoral-popliteal bypass graft  05/29/2012    Procedure: BYPASS GRAFT FEMORAL-POPLITEAL ARTERY;  Surgeon: Mal Misty, MD;  Location[facility];  Service: Vascular;  Laterality: Right;  Right Common femoral artery to above knee popliteal artery bypass graft using non-reversed saphenous vein  . Intraoperative arteriogram  05/29/2012    Procedure: INTRA OPERATIVE ARTERIOGRAM;  Surgeon: Mal Misty, MD;  Location: [facility] OR;  Service: Vascular;  Laterality: Right;    Social History History  Substance Use Topics  . Smoking status: Former Smoker -- 1.50 packs/day for 44 years    Types: Cigarettes    Quit date: 11/30/2010  . Smokeless tobacco: Never Used  . Alcohol Use: No    Family History Family History  Problem Relation Age of Onset  . Hypertension Sister   . Diabetes Sister   . Hyperlipidemia Sister   . Cancer Sister   . Hyperlipidemia Sister   . Hyperlipidemia Brother   . Diabetes Brother   . Lung cancer Father   . Cancer Father   . Heart disease Father   . Hypertension Father   . Diabetes Father   . Stomach cancer Sister   . Diabetes Daughter     No Known Allergies   REVIEW OF SYSTEMS: See HPI for pertinent positives and negatives.  Physical Examination Filed Vitals:   02/16/14 1219  BP: 147/92  Pulse: 104   Filed Weights   02/16/14 1223  Weight: 211 lb (95.709 kg)   Body mass index is 37.39 kg/(m^2).  General:  WDWN in NAD, obese female in wheelchair Gait: Normal HENT: WNL Eyes: Pupils equal Pulmonary: normal non-labored breathing , without Rales, rhonchi,  wheezing Cardiac: RRR, no murmurs detected  Abdomen: soft, NT, no masses Skin: no rashes, dried and healing ulcers noted left second toe;  no Gangrene , no cellulitis; no open  wounds; 2+ bilateral pretibial pitting edema.  VASCULAR EXAM  Carotid Bruits Left Right   Negative Negative                             VASCULAR EXAM: Extremities without ischemic changes  without Gangrene; without open wounds; without drainage.                                                                                                          LE Pulses LEFT RIGHT       FEMORAL   palpable   palpable        POPLITEAL  not palpable   not palpable       POSTERIOR TIBIAL  not palpable   not palpable        DORSALIS PEDIS      ANTERIOR TIBIAL not palpable  not palpable      Musculoskeletal: no muscle wasting or atrophy; no edema  Neurologic: A&O X 3; Appropriate Affect ;  SENSATION: normal; MOTOR FUNCTION: 5/5 Symmetric, CN 2-12 intact Speech is fluent/normal   Non-Invasive Vascular Imaging (02/16/2014): CEREBROVASCULAR DUPLEX EVALUATION    INDICATION: Right arm paresthesia; History of CVA 12/2010    PREVIOUS INTERVENTION(S): N/A    DUPLEX EXAM:     RIGHT  LEFT  Peak Systolic Velocities (cm/s) End Diastolic Velocities (cm/s) Plaque LOCATION Peak Systolic Velocities (cm/s) End Diastolic Velocities (cm/s) Plaque  107 14  CCA PROXIMAL 90 10   86 16  CCA MID 107 17   65 14 HT CCA DISTAL 83 22 HT  79 12 HT ECA 105 12 HT  81 16 HT ICA PROXIMAL 82 20 HT  84 21  ICA MID 87 29   96 29  ICA DISTAL 60 19      ICA / CCA Ratio (PSV)   Antegrade Vertebral Flow Antegrade  [id number] Brachial Systolic Pressure (mmHg) [id number]  Triphasic Brachial Artery Waveforms Triphasic    Plaque Morphology:  HM = Homogeneous, HT = Heterogeneous, CP = Calcific Plaque, SP = Smooth Plaque, IP = Irregular Plaque     ADDITIONAL FINDINGS:     IMPRESSION: Bilateral internal carotid artery stenosis present in the less than 40% range.    Compared to the previous exam:  No previous study to compare.    LOWER EXTREMITY ARTERIAL EVALUATION    INDICATION: Peripheral vascular disease     PREVIOUS  INTERVENTION(S): Right femoral-popliteal bypass graft 05/29/2012; Left superficial femoral artery percutaneous transluminal angioplasty w/stent03/02/2010; Left popliteal/tibioperoneal trunk atherectomy w/percutaneous transluminal angioplasty 11/30/2010.    DUPLEX EXAM: Duplex evaluation of the lower extremity arterial system to include the common femoral, superficial femoral, popliteal, and tibial arteries and bypass graft(s) and/or stent(s) if present.      FINDINGS:                                       RIGHT LOWER EXTREMITY:   Heterogeneous plaque present involving the popliteal and trunk arteries suggestive of less than 50% stenosis.   LEFT LOWER EXTREMITY:  Elevated velocity present involving the proximal superficial femoral artery suggestive of greater than 50% stenosis. Heterogeneous plaque present at the popliteal and trunk arteries suggestive of less than 50% stenosis.        See the attached diagram for velocities.  IMPRESSION:  Patent right femoral-popliteal artery bypass graft, no hemodynamically significant stenosis identified. Heterogeneous plaque present involving the popliteal and trunk arteries suggestive of less than 50% stenosis. Elevated velocity present involving the proximal superficial femoral artery suggestive of greater than 50% stenosis. Patent left superficial femoral artery stent. Heterogeneous plaque present at the popliteal and trunk arteries suggestive of less than 50% stenosis.    ANKLE/BRACHIAL INDEX - Right = 0.72                     Left =   0.51         (Please see complete report)        ASSESSMENT:  [name] is a 63 y.o. female who is s/p right femoral popliteal bypass graft on 05/29/2012 and left SFA stent on 01/12/2010, left popliteal/tibioperoneal trunk atherectomy w/percutaneous transluminal angioplasty 11/30/2010. Her ABI's have decreased from six months prior. She has DM neuropathy symptoms.  Patent right femoral-popliteal artery  bypass graft, no hemodynamically significant stenosis identified. Heterogeneous plaque present involving the popliteal and trunk arteries suggestive of less than 50% stenosis.  Elevated velocity, present involving the proximal superficial femoral artery suggestive of greater than 50% stenosis (highest velocity at this level is 442 today, was 511 six months ago). Less than 40 % bilateral internal carotid artery stenosis.   PLAN:   Stationary bike 10 minutes twice daily for 2 weeks, then 20 minutes twice daily. Increase walking time also. Based on today's exam and non-invasive vascular lab results, the patient will follow up in 6 months with the following tests ABI's and bilateral LE arterial Duplex, 2 years for carotid Duplex. I discussed in depth with the patient the nature of atherosclerosis, and emphasized the importance of maximal medical management including strict control of blood pressure, blood glucose, and lipid levels, obtaining regular exercise, and cessation of smoking.  The patient is aware that without maximal medical management the underlying atherosclerotic disease process will progress, limiting the benefit of any interventions. The patient was given information about stroke prevention and what symptoms should prompt the patient to seek immediate medical care.  The patient was given information about PAD including signs, symptoms, treatment, what symptoms should prompt the patient to seek immediate medical care, and risk reduction measures to take. Thank you for allowing [place] to participate in this patient's care.  Clemon Chambers, RN, MSN, FNP-C [facility] Office: [phone]  Clinic MD: Early 02/16/2014 12:24 PM

## 2014-02-16 NOTE — Addendum Note (Signed)
Addended by: Dorthula Rue L on: 02/16/2014 04:11 PM   Modules accepted: Orders

## 2014-05-25 ENCOUNTER — Encounter (INDEPENDENT_AMBULATORY_CARE_PROVIDER_SITE_OTHER): Payer: Self-pay | Admitting: *Deleted

## 2014-05-31 ENCOUNTER — Encounter (INDEPENDENT_AMBULATORY_CARE_PROVIDER_SITE_OTHER): Payer: Self-pay | Admitting: *Deleted

## 2014-06-11 HISTORY — PX: CT RADIATION THERAPY GUIDE: HXRAD513

## 2014-06-28 ENCOUNTER — Telehealth: Payer: Self-pay | Admitting: *Deleted

## 2014-06-28 NOTE — Telephone Encounter (Signed)
Received referral from [facility].  Called pt and confirmed 07/08/14 genetic appt w/ her.  Mailed calendar and welcoming packet to pt.  Called and left a message for Ava at referring to make her aware.  Made copy of records and took one to Med Rec to scan and sent another one upstairs for the genetic counselor.

## 2014-07-05 ENCOUNTER — Ambulatory Visit (INDEPENDENT_AMBULATORY_CARE_PROVIDER_SITE_OTHER): Payer: Medicare Other | Admitting: Internal Medicine

## 2014-07-08 ENCOUNTER — Other Ambulatory Visit: Payer: Medicare Other

## 2014-08-23 ENCOUNTER — Encounter: Payer: Self-pay | Admitting: Family

## 2014-08-24 ENCOUNTER — Ambulatory Visit: Payer: Medicare Other | Admitting: Family

## 2014-08-24 ENCOUNTER — Other Ambulatory Visit (HOSPITAL_COMMUNITY): Payer: Medicare Other

## 2014-08-24 ENCOUNTER — Encounter (HOSPITAL_COMMUNITY): Payer: Medicare Other

## 2014-09-02 ENCOUNTER — Encounter (HOSPITAL_COMMUNITY): Payer: Self-pay | Admitting: Pharmacy Technician

## 2014-09-02 ENCOUNTER — Encounter (HOSPITAL_COMMUNITY)
Admission: RE | Admit: 2014-09-02 | Discharge: 2014-09-02 | Disposition: A | Discharge status: Home / Self Care | Payer: Medicare Other | Source: Ambulatory Visit | Attending: Ophthalmology | Admitting: Ophthalmology

## 2014-09-02 ENCOUNTER — Encounter (HOSPITAL_COMMUNITY): Payer: Self-pay

## 2014-09-02 DIAGNOSIS — Z01812 Encounter for preprocedural laboratory examination: Secondary | ICD-10-CM | POA: Diagnosis present

## 2014-09-02 LAB — BASIC METABOLIC PANEL
Anion gap: 14 (ref 5–15)
BUN: 11 mg/dL (ref 6–23)
CO2: 24 mEq/L (ref 19–32)
Calcium: 8.6 mg/dL (ref 8.4–10.5)
Chloride: 99 mEq/L (ref 96–112)
Creatinine, Ser: 0.65 mg/dL (ref 0.50–1.10)
GFR calc Af Amer: >90 mL/min (ref >90)
GFR calc non Af Amer: >90 mL/min (ref >90)
GLUCOSE: 317 mg/dL — AB (ref 70–99)
Potassium: 4.3 mEq/L (ref 3.7–5.3)
SODIUM: 137 meq/L (ref 137–147)

## 2014-09-02 LAB — HEMOGLOBIN AND HEMATOCRIT, BLOOD
HCT: 39.9 % (ref 36.0–46.0)
Hemoglobin: 13.4 g/dL (ref 12.0–15.0)

## 2014-09-02 NOTE — Patient Instructions (Signed)
Your procedure is scheduled on: 09/08/2014  Report to [facility] at  700 AM.  Call this number if you have problems the morning of surgery: [phone]   Do not eat food or drink liquids :After Midnight.      Take these medicines the morning of surgery with A SIP OF WATER: hydrocodone, amlodipine, keppra, robaxin, reglan,protonix, ultram. Take your inhaler before you come and bring it with you.   Do not wear jewelry, make-up or nail polish.  Do not wear lotions, powders, or perfumes.   Do not shave 48 hours prior to surgery.  Do not bring valuables to the hospital.  Contacts, dentures or bridgework may not be worn into surgery.  Leave suitcase in the car. After surgery it may be brought to your room.  For patients admitted to the hospital, checkout time is 11:00 AM the day of discharge.   Patients discharged the day of surgery will not be allowed to drive home.  :     Please read over the following fact sheets that you were given: Coughing and Deep Breathing, Surgical Site Infection Prevention, Anesthesia Post-op Instructions and Care and Recovery After Surgery    Cataract A cataract is a clouding of the lens of the eye. When a lens becomes cloudy, vision is reduced based on the degree and nature of the clouding. Many cataracts reduce vision to some degree. Some cataracts make people more near-sighted as they develop. Other cataracts increase glare. Cataracts that are ignored and become worse can sometimes look white. The white color can be seen through the pupil. CAUSES   Aging. However, cataracts may occur at any age, even in newborns.   Certain drugs.   Trauma to the eye.   Certain diseases such as diabetes.   Specific eye diseases such as chronic inflammation inside the eye or a sudden attack of a rare form of glaucoma.   Inherited or acquired medical problems.  SYMPTOMS   Gradual, progressive drop in vision in the affected eye.   Severe, rapid visual loss. This most often  happens when trauma is the cause.  DIAGNOSIS  To detect a cataract, an eye doctor examines the lens. Cataracts are best diagnosed with an exam of the eyes with the pupils enlarged (dilated) by drops.  TREATMENT  For an early cataract, vision may improve by using different eyeglasses or stronger lighting. If that does not help your vision, surgery is the only effective treatment. A cataract needs to be surgically removed when vision loss interferes with your everyday activities, such as driving, reading, or watching TV. A cataract may also have to be removed if it prevents examination or treatment of another eye problem. Surgery removes the cloudy lens and usually replaces it with a substitute lens (intraocular lens, IOL).  At a time when both you and your doctor agree, the cataract will be surgically removed. If you have cataracts in both eyes, only one is usually removed at a time. This allows the operated eye to heal and be out of danger from any possible problems after surgery (such as infection or poor wound healing). In rare cases, a cataract may be doing damage to your eye. In these cases, your caregiver may advise surgical removal right away. The vast majority of people who have cataract surgery have better vision afterward. HOME CARE INSTRUCTIONS  If you are not planning surgery, you may be asked to do the following:  Use different eyeglasses.   Use stronger or brighter lighting.  Ask your eye doctor about reducing your medicine dose or changing medicines if it is thought that a medicine caused your cataract. Changing medicines does not make the cataract go away on its own.   Become familiar with your surroundings. Poor vision can lead to injury. Avoid bumping into things on the affected side. You are at a higher risk for tripping or falling.   Exercise extreme care when driving or operating machinery.   Wear sunglasses if you are sensitive to bright light or experiencing problems with  glare.  SEEK IMMEDIATE MEDICAL CARE IF:   You have a worsening or sudden vision loss.   You notice redness, swelling, or increasing pain in the eye.   You have a fever.  Document Released: 10/28/2005 Document Revised: 10/17/2011 Document Reviewed: 06/21/2011 [facility] Patient Information 2012 [place].PATIENT INSTRUCTIONS POST-ANESTHESIA  IMMEDIATELY FOLLOWING SURGERY:  Do not drive or operate machinery for the first twenty four hours after surgery.  Do not make any important decisions for twenty four hours after surgery or while taking narcotic pain medications or sedatives.  If you develop intractable nausea and vomiting or a severe headache please notify your doctor immediately.  FOLLOW-UP:  Please make an appointment with your surgeon as instructed. You do not need to follow up with anesthesia unless specifically instructed to do so.  WOUND CARE INSTRUCTIONS (if applicable):  Keep a dry clean dressing on the anesthesia/puncture wound site if there is drainage.  Once the wound has quit draining you may leave it open to air.  Generally you should leave the bandage intact for twenty four hours unless there is drainage.  If the epidural site drains for more than 36-48 hours please call the anesthesia department.  QUESTIONS?:  Please feel free to call your physician or the hospital operator if you have any questions, and they will be happy to assist you.

## 2014-09-02 NOTE — Pre-Procedure Instructions (Signed)
Patient given information to sign up for my chart at home. 

## 2014-09-07 MED ORDER — TETRACAINE HCL 0.5 % OP SOLN
OPHTHALMIC | Status: AC
  Filled 2014-09-07: qty 2

## 2014-09-07 MED ORDER — LIDOCAINE HCL (PF) 1 % IJ SOLN
INTRAMUSCULAR | Status: AC
  Filled 2014-09-07: qty 2

## 2014-09-07 MED ORDER — PHENYLEPHRINE HCL 2.5 % OP SOLN
OPHTHALMIC | Status: AC
  Filled 2014-09-07: qty 15

## 2014-09-07 MED ORDER — CYCLOPENTOLATE-PHENYLEPHRINE OP SOLN OPTIME - NO CHARGE
OPHTHALMIC | Status: AC
  Filled 2014-09-07: qty 2

## 2014-09-07 MED ORDER — LIDOCAINE HCL 3.5 % OP GEL
OPHTHALMIC | Status: AC
  Filled 2014-09-07: qty 1

## 2014-09-07 MED ORDER — NEOMYCIN-POLYMYXIN-DEXAMETH 3.5-10000-0.1 OP SUSP
OPHTHALMIC | Status: AC
  Filled 2014-09-07: qty 5

## 2014-09-08 ENCOUNTER — Ambulatory Visit (HOSPITAL_COMMUNITY)
Admission: RE | Admit: 2014-09-08 | Discharge: 2014-09-08 | Disposition: A | Discharge status: Home / Self Care | Payer: Medicare Other | Source: Ambulatory Visit | Attending: Ophthalmology | Admitting: Ophthalmology

## 2014-09-08 ENCOUNTER — Encounter (HOSPITAL_COMMUNITY)
Admission: RE | Disposition: A | Discharge status: Home / Self Care | Payer: Self-pay | Source: Ambulatory Visit | Attending: Ophthalmology

## 2014-09-08 ENCOUNTER — Encounter (HOSPITAL_COMMUNITY): Payer: Medicare Other | Admitting: Anesthesiology

## 2014-09-08 ENCOUNTER — Ambulatory Visit (HOSPITAL_COMMUNITY): Payer: Medicare Other | Admitting: Anesthesiology

## 2014-09-08 ENCOUNTER — Encounter (HOSPITAL_COMMUNITY): Payer: Self-pay

## 2014-09-08 DIAGNOSIS — I10 Essential (primary) hypertension: Secondary | ICD-10-CM | POA: Diagnosis not present

## 2014-09-08 DIAGNOSIS — I2510 Atherosclerotic heart disease of native coronary artery without angina pectoris: Secondary | ICD-10-CM | POA: Diagnosis not present

## 2014-09-08 DIAGNOSIS — Z7982 Long term (current) use of aspirin: Secondary | ICD-10-CM | POA: Insufficient documentation

## 2014-09-08 DIAGNOSIS — Z87891 Personal history of nicotine dependence: Secondary | ICD-10-CM | POA: Diagnosis not present

## 2014-09-08 DIAGNOSIS — K219 Gastro-esophageal reflux disease without esophagitis: Secondary | ICD-10-CM | POA: Diagnosis not present

## 2014-09-08 DIAGNOSIS — Z794 Long term (current) use of insulin: Secondary | ICD-10-CM | POA: Insufficient documentation

## 2014-09-08 DIAGNOSIS — H25812 Combined forms of age-related cataract, left eye: Secondary | ICD-10-CM | POA: Insufficient documentation

## 2014-09-08 DIAGNOSIS — G4730 Sleep apnea, unspecified: Secondary | ICD-10-CM | POA: Diagnosis not present

## 2014-09-08 DIAGNOSIS — Z79899 Other long term (current) drug therapy: Secondary | ICD-10-CM | POA: Diagnosis not present

## 2014-09-08 DIAGNOSIS — J449 Chronic obstructive pulmonary disease, unspecified: Secondary | ICD-10-CM | POA: Insufficient documentation

## 2014-09-08 DIAGNOSIS — E119 Type 2 diabetes mellitus without complications: Secondary | ICD-10-CM | POA: Diagnosis not present

## 2014-09-08 DIAGNOSIS — Z7902 Long term (current) use of antithrombotics/antiplatelets: Secondary | ICD-10-CM | POA: Insufficient documentation

## 2014-09-08 HISTORY — PX: CATARACT EXTRACTION W/PHACO: SHX586

## 2014-09-08 LAB — GLUCOSE, CAPILLARY: GLUCOSE-CAPILLARY: 90 mg/dL (ref 70–99)

## 2014-09-08 SURGERY — PHACOEMULSIFICATION, CATARACT, WITH IOL INSERTION
Anesthesia: Monitor Anesthesia Care | Site: Eye | Laterality: Left

## 2014-09-08 MED ORDER — LIDOCAINE HCL (PF) 1 % IJ SOLN
INTRAMUSCULAR | Status: DC | PRN
  Administered 2014-09-08: .5 mL

## 2014-09-08 MED ORDER — LIDOCAINE HCL 3.5 % OP GEL
1.0000 "application " | Freq: Once | OPHTHALMIC | Status: AC
  Administered 2014-09-08: 1 via OPHTHALMIC

## 2014-09-08 MED ORDER — POVIDONE-IODINE 5 % OP SOLN
OPHTHALMIC | Status: DC | PRN
  Administered 2014-09-08: 1 via OPHTHALMIC

## 2014-09-08 MED ORDER — LIDOCAINE 3.5 % OP GEL OPTIME - NO CHARGE
OPHTHALMIC | Status: DC | PRN
  Administered 2014-09-08: 2 [drp] via OPHTHALMIC

## 2014-09-08 MED ORDER — MIDAZOLAM HCL 2 MG/2ML IJ SOLN
INTRAMUSCULAR | Status: AC
  Filled 2014-09-08: qty 2

## 2014-09-08 MED ORDER — BSS IO SOLN
INTRAOCULAR | Status: DC | PRN
  Administered 2014-09-08: 15 mL

## 2014-09-08 MED ORDER — NEOMYCIN-POLYMYXIN-DEXAMETH 3.5-10000-0.1 OP SUSP
OPHTHALMIC | Status: DC | PRN
  Administered 2014-09-08: 2 [drp] via OPHTHALMIC

## 2014-09-08 MED ORDER — EPINEPHRINE HCL 1 MG/ML IJ SOLN
INTRAMUSCULAR | Status: DC | PRN
  Administered 2014-09-08: 08:00:00

## 2014-09-08 MED ORDER — PROVISC 10 MG/ML IO SOLN
INTRAOCULAR | Status: DC | PRN
  Administered 2014-09-08: 0.85 mL via INTRAOCULAR

## 2014-09-08 MED ORDER — FENTANYL CITRATE 0.05 MG/ML IJ SOLN
INTRAMUSCULAR | Status: AC
  Filled 2014-09-08: qty 2

## 2014-09-08 MED ORDER — MIDAZOLAM HCL 2 MG/2ML IJ SOLN
1.0000 mg | INTRAMUSCULAR | Status: AC | PRN
  Administered 2014-09-08 (×3): 2 mg via INTRAVENOUS
  Filled 2014-09-08 (×2): qty 2

## 2014-09-08 MED ORDER — TETRACAINE HCL 0.5 % OP SOLN
1.0000 [drp] | OPHTHALMIC | Status: AC
  Administered 2014-09-08 (×3): 1 [drp] via OPHTHALMIC

## 2014-09-08 MED ORDER — EPINEPHRINE HCL 1 MG/ML IJ SOLN
INTRAMUSCULAR | Status: AC
  Filled 2014-09-08: qty 1

## 2014-09-08 MED ORDER — PHENYLEPHRINE HCL 2.5 % OP SOLN
1.0000 [drp] | OPHTHALMIC | Status: AC
  Administered 2014-09-08 (×3): 1 [drp] via OPHTHALMIC

## 2014-09-08 MED ORDER — CYCLOPENTOLATE-PHENYLEPHRINE 0.2-1 % OP SOLN
1.0000 [drp] | OPHTHALMIC | Status: AC
  Administered 2014-09-08 (×3): 1 [drp] via OPHTHALMIC

## 2014-09-08 MED ORDER — FENTANYL CITRATE 0.05 MG/ML IJ SOLN
25.0000 ug | INTRAMUSCULAR | Status: AC
  Administered 2014-09-08 (×2): 25 ug via INTRAVENOUS

## 2014-09-08 MED ORDER — LACTATED RINGERS IV SOLN
INTRAVENOUS | Status: DC
  Administered 2014-09-08: 08:00:00 via INTRAVENOUS

## 2014-09-08 SURGICAL SUPPLY — 11 items
CLOTH BEACON ORANGE TIMEOUT ST (SAFETY) ×2 IMPLANT
EYE SHIELD UNIVERSAL CLEAR (GAUZE/BANDAGES/DRESSINGS) ×2 IMPLANT
GLOVE BIOGEL PI IND STRL 6.5 (GLOVE) IMPLANT
GLOVE BIOGEL PI INDICATOR 6.5 (GLOVE) ×2
GLOVE EXAM NITRILE MD LF STRL (GLOVE) ×2 IMPLANT
PAD ARMBOARD 7.5X6 YLW CONV (MISCELLANEOUS) ×2 IMPLANT
SIGHTPATH CAT PROC W REG LENS (Ophthalmic Related) ×3 IMPLANT
SYRINGE LUER LOK 1CC (MISCELLANEOUS) ×2 IMPLANT
TAPE SURG TRANSPORE 1 IN (GAUZE/BANDAGES/DRESSINGS) IMPLANT
TAPE SURGICAL TRANSPORE 1 IN (GAUZE/BANDAGES/DRESSINGS) ×2
WATER STERILE IRR 250ML POUR (IV SOLUTION) ×2 IMPLANT

## 2014-09-08 NOTE — Anesthesia Procedure Notes (Signed)
Procedure Name: MAC Date/Time: 09/08/2014 8:13 AM Performed by: Vista Deck Pre-anesthesia Checklist: Patient identified, Emergency Drugs available, Suction available, Timeout performed and Patient being monitored Patient Re-evaluated:Patient Re-evaluated prior to inductionOxygen Delivery Method: Nasal Cannula

## 2014-09-08 NOTE — H&P (Signed)
I have reviewed the H&P, the patient was re-examined, and I have identified no interval changes in medical condition and plan of care since the history and physical of record  

## 2014-09-08 NOTE — Op Note (Signed)
Date of Admission: 09/08/2014  Date of Surgery: 09/08/2014   Pre-Op Dx: Cataract Left Eye  Post-Op Dx: Senile Combined Cataract Left  Eye,  Dx Code K55.374  Surgeon: Tonny Branch, M.D.  Assistants: None  Anesthesia: Topical with MAC  Indications: Painless, progressive loss of vision with compromise of daily activities.  Surgery: Cataract Extraction with Intraocular lens Implant Left Eye  Discription: The patient had dilating drops and viscous lidocaine placed into the Left eye in the pre-op holding area. After transfer to the operating room, a time out was performed. The patient was then prepped and draped. Beginning with a 40 degree blade a paracentesis port was made at the surgeon's 2 o'clock position. The anterior chamber was then filled with 1% non-preserved lidocaine. This was followed by filling the anterior chamber with Provisc.  A 2.41mm keratome blade was used to make a clear corneal incision at the temporal limbus.  A bent cystatome needle was used to create a continuous tear capsulotomy. Hydrodissection was performed with balanced salt solution on a Fine canula. The lens nucleus was then removed using the phacoemulsification handpiece. Residual cortex was removed with the I&A handpiece. The anterior chamber and capsular bag were refilled with Provisc. A posterior chamber intraocular lens was placed into the capsular bag with it's injector. The implant was positioned with the Kuglan hook. The Provisc was then removed from the anterior chamber and capsular bag with the I&A handpiece. Stromal hydration of the main incision and paracentesis port was performed with BSS on a Fine canula. The wounds were tested for leak which was negative. The patient tolerated the procedure well. There were no operative complications. The patient was then transferred to the recovery room in stable condition.  Complications: None  Specimen: None  EBL: None  Prosthetic device: Hoya iSert 250, power 22.0 D,  SN [id number].

## 2014-09-08 NOTE — Progress Notes (Signed)
2 of versed given IV blew so patient did not receive 1st dose.

## 2014-09-08 NOTE — Anesthesia Postprocedure Evaluation (Signed)
  Anesthesia Post-op Note  Patient: [name]  Procedure(s) Performed: Procedure(s) (LRB): CATARACT EXTRACTION PHACO AND INTRAOCULAR LENS PLACEMENT LEFT EYE CDE=8.54 (Left)  Patient Location:  Short Stay  Anesthesia Type: MAC  Level of Consciousness: awake  Airway and Oxygen Therapy: Patient Spontanous Breathing  Post-op Pain: none  Post-op Assessment: Post-op Vital signs reviewed, Patient's Cardiovascular Status Stable, Respiratory Function Stable, Patent Airway, No signs of Nausea or vomiting and Pain level controlled  Post-op Vital Signs: Reviewed and stable  Complications: No apparent anesthesia complications

## 2014-09-08 NOTE — Transfer of Care (Signed)
Immediate Anesthesia Transfer of Care Note  Patient: [name]  Procedure(s) Performed: Procedure(s) (LRB): CATARACT EXTRACTION PHACO AND INTRAOCULAR LENS PLACEMENT LEFT EYE CDE=8.54 (Left)  Patient Location: Shortstay  Anesthesia Type: MAC  Level of Consciousness: awake  Airway & Oxygen Therapy: Patient Spontanous Breathing   Post-op Assessment: Report given to PACU RN, Post -op Vital signs reviewed and stable and Patient moving all extremities  Post vital signs: Reviewed and stable  Complications: No apparent anesthesia complications

## 2014-09-08 NOTE — Discharge Instructions (Signed)

## 2014-09-08 NOTE — Anesthesia Preprocedure Evaluation (Signed)
Anesthesia Evaluation  Patient identified by MRN, date of birth, ID band Patient awake    Reviewed: Allergy & Precautions, H&P , NPO status , Patient's Chart, lab work & pertinent test results  Airway Mallampati: II  TM Distance: >3 FB     Dental  (+) Edentulous Upper, Edentulous Lower   Pulmonary shortness of breath and with exertion, sleep apnea and Continuous Positive Airway Pressure Ventilation , COPDformer smoker,          Cardiovascular hypertension, Pt. on medications + CAD, + Past MI, + Peripheral Vascular Disease and +CHF Rhythm:Regular Rate:Normal     Neuro/Psych  Neuromuscular disease CVA    GI/Hepatic GERD-  ,  Endo/Other  diabetes  Renal/GU      Musculoskeletal   Abdominal   Peds  Hematology   Anesthesia Other Findings   Reproductive/Obstetrics                             Anesthesia Physical Anesthesia Plan  ASA: III  Anesthesia Plan: MAC   Post-op Pain Management:    Induction: Intravenous  Airway Management Planned: Nasal Cannula  Additional Equipment:   Intra-op Plan:   Post-operative Plan:   Informed Consent: I have reviewed the patients History and Physical, chart, labs and discussed the procedure including the risks, benefits and alternatives for the proposed anesthesia with the patient or authorized representative who has indicated his/her understanding and acceptance.     Plan Discussed with:   Anesthesia Plan Comments:         Anesthesia Quick Evaluation

## 2014-09-09 ENCOUNTER — Encounter (HOSPITAL_COMMUNITY): Payer: Self-pay | Admitting: Ophthalmology

## 2014-09-21 ENCOUNTER — Telehealth: Payer: Self-pay | Admitting: *Deleted

## 2014-09-21 NOTE — Telephone Encounter (Signed)
Received referral from Dr. Jacquiline Doe in [place] for genetics.  Called pt and confirmed 10/10/14 genetic appt w/ her.  Called Crystal at referring to make her aware.

## 2014-09-27 ENCOUNTER — Ambulatory Visit: Payer: Medicare Other | Admitting: Family

## 2014-09-27 ENCOUNTER — Encounter (HOSPITAL_COMMUNITY): Payer: Medicare Other

## 2014-09-27 ENCOUNTER — Other Ambulatory Visit (HOSPITAL_COMMUNITY): Payer: Medicare Other

## 2014-10-07 ENCOUNTER — Telehealth: Payer: Self-pay | Admitting: *Deleted

## 2014-10-07 NOTE — Telephone Encounter (Signed)
Pt called stating that she wants to wait until Jan to have her genetics completed.  Rescheduled and confirmed 11/16/14 genetic appt w/ pt.

## 2014-10-10 ENCOUNTER — Encounter: Payer: Medicare Other | Admitting: Genetic Counselor

## 2014-10-10 ENCOUNTER — Other Ambulatory Visit: Payer: Medicare Other

## 2014-10-20 ENCOUNTER — Encounter (HOSPITAL_COMMUNITY): Payer: Self-pay | Admitting: Vascular Surgery

## 2014-11-02 ENCOUNTER — Encounter: Payer: Self-pay | Admitting: Family

## 2014-11-03 ENCOUNTER — Encounter (HOSPITAL_COMMUNITY): Payer: Medicare Other

## 2014-11-03 ENCOUNTER — Inpatient Hospital Stay (HOSPITAL_COMMUNITY): Admission: RE | Admit: 2014-11-03 | Payer: Medicare Other | Source: Ambulatory Visit

## 2014-11-03 ENCOUNTER — Ambulatory Visit: Payer: Medicare Other | Admitting: Family

## 2014-11-08 ENCOUNTER — Ambulatory Visit (HOSPITAL_COMMUNITY)
Admission: RE | Admit: 2014-11-08 | Discharge: 2014-11-08 | Disposition: A | Discharge status: Home / Self Care | Payer: Medicare Other | Source: Ambulatory Visit | Attending: Vascular Surgery | Admitting: Vascular Surgery

## 2014-11-08 ENCOUNTER — Ambulatory Visit (INDEPENDENT_AMBULATORY_CARE_PROVIDER_SITE_OTHER)
Admission: RE | Admit: 2014-11-08 | Discharge: 2014-11-08 | Disposition: A | Discharge status: Home / Self Care | Payer: Medicare Other | Source: Ambulatory Visit | Attending: Vascular Surgery | Admitting: Vascular Surgery

## 2014-11-08 ENCOUNTER — Encounter: Payer: Self-pay | Admitting: Family

## 2014-11-08 DIAGNOSIS — I739 Peripheral vascular disease, unspecified: Secondary | ICD-10-CM | POA: Insufficient documentation

## 2014-11-08 DIAGNOSIS — I70203 Unspecified atherosclerosis of native arteries of extremities, bilateral legs: Secondary | ICD-10-CM | POA: Diagnosis not present

## 2014-11-14 ENCOUNTER — Ambulatory Visit (INDEPENDENT_AMBULATORY_CARE_PROVIDER_SITE_OTHER): Payer: Medicare Other | Admitting: Family

## 2014-11-14 ENCOUNTER — Encounter: Payer: Self-pay | Admitting: Family

## 2014-11-14 VITALS — BP 132/72 | HR 103 | Resp 16 | Ht 63.0 in | Wt 212.0 lb

## 2014-11-14 DIAGNOSIS — Z48812 Encounter for surgical aftercare following surgery on the circulatory system: Secondary | ICD-10-CM

## 2014-11-14 DIAGNOSIS — I6523 Occlusion and stenosis of bilateral carotid arteries: Secondary | ICD-10-CM

## 2014-11-14 DIAGNOSIS — I739 Peripheral vascular disease, unspecified: Secondary | ICD-10-CM

## 2014-11-14 NOTE — Patient Instructions (Signed)

## 2014-11-14 NOTE — Progress Notes (Signed)
[facility] [place] HISTORY AND PHYSICAL -PAD  History of Present Illness [name] is a 64 y.o. female  patient of Dr. Kellie Simmering who returns for continued followup regarding a right femoral popliteal bypass graft on 05/29/2012 and left SFA stent on 01/12/2010, left popliteal/tibioperoneal trunk atherectomy w/percutaneous transluminal angioplasty 11/30/2010, and also for carotid Duplex. She had evidence of some elevated velocity in the mid portion of the vein graft in the past.  Pt. Denies claudication symptoms, has pain in left foot,attributes to neuropathy, and right leg pain hurts no worse with walking; but she does not walk much due to feet and low back pain.  She has been seen at the [facility] wound center for left second toe tip ulcer which has improved per pt and son, but pt states her podiatrist told her that she has infection in the bone of the tip of the left second toe and needs partial amputation oft this toe.  Pt states her podiatrist wants the results of today's PAD testing.  Had a stroke in 2012 manifested by right leg and arm weakness, has improved about 50% per grand-daughter as she was not able to walk after her stroke. She also has tingling in right hand and lateral asect right leg.  [facility] for CAD.  Patient reports New Medical or Surgical History: recently diagnosed with thyroid nodules, had thyroid ablation; also recently diagnosed with osteoporosis.  Recurrence of breast cancer, was left, now right.  Has healing ulcer on dorsal aspect right second toe; pt reports that ulcer improved on antibiotic prescribed by her PCP. She walks very little, uses a walker, has intermittent radiculopathy pain bilaterally with walking. Is not using stationary bike, states she cannot navigate this, not walking much due to pain in feet and legs.  Pt Diabetic: Yes, states her blood sugar is about 200. Pt smoker: former smoker, quit in 2011  Pt meds  include:  Statin :Yes  Betablocker: Yes  ASA: Yes  Other anticoagulants/antiplatelets: Plavix     Past Medical History  Diagnosis Date  . GERD (gastroesophageal reflux disease)   . PAD (peripheral artery disease)     Stent, embolectomy, surgery  /     Dr. Oneida Alar, ABI 0.52 right, 0.54 left, February, 2010  . Systolic and diastolic CHF, chronic   . CAD (coronary artery disease)     June, 2012, DES to RCA, staged DES to circumflex and DES to diagonal with pressure wire to LAD, FFR 0.84  . DM (diabetes mellitus), type 2   . Hypertension   . Morbid obesity   . Hyperlipidemia   . Ejection fraction < 50%     EF 45-50%, echo, June, 2012  . Diastolic dysfunction     grade 2, echo, June, 2012  . Peripheral neuropathy   . Tobacco abuse   . Syncope     vasovagal  . DVT (deep venous thrombosis)   . Shortness of breath     with exertion  . Stroke Dec. 2012    Right side  . 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 OSA (obstructive sleep apnea)     Dr. Halford Chessman  CPAP started August, 2012. pt unable to tolerate adn PCP aware.  . Breast cancer 2002    lumpectomy and radiation LEFT  . Breast cancer Aug. 2015    Right Breast:  Radiation    Social History History  Substance Use Topics  . Smoking status: Former Smoker --  1.50 packs/day for 44 years    Types: Cigarettes    Quit date: 11/30/2010  . Smokeless tobacco: Never Used  . Alcohol Use: No    Family History Family History  Problem Relation Age of Onset  . Hypertension Sister   . Diabetes Sister   . Hyperlipidemia Sister   . Cancer Sister   . Hyperlipidemia Sister   . Hyperlipidemia Brother   . Diabetes Brother   . Lung cancer Father   . Cancer Father   . Heart disease Father   . Hypertension Father   . Diabetes Father   . Stomach cancer Sister   . Diabetes Daughter     Past Surgical History  Procedure Laterality Date  . Appendectomy    . Cholecystectomy    . Tubal ligation    . Breast surgery      Left breast  cancer  . Cardiac catheterization  2002  . Popliteal artery angioplasty  11/30/10    Left leg w/ embolectomy, endarterectomy  by Dr.Charles Fields and 3-4 stents  . Coronary angioplasty with stent placement    . Pr vein bypass graft,aorto-fem-pop    . Femoral-popliteal bypass graft  05/29/2012    Procedure: BYPASS GRAFT FEMORAL-POPLITEAL ARTERY;  Surgeon: Mal Misty, MD;  Location: [facility] OR;  Service: Vascular;  Laterality: Right;  Right Common femoral artery to above knee popliteal artery bypass graft using non-reversed saphenous vein  . Intraoperative arteriogram  05/29/2012    Procedure: INTRA OPERATIVE ARTERIOGRAM;  Surgeon: Mal Misty, MD;  Location: [facility];  Service: Vascular;  Laterality: Right;  . Breast biopsy Right   . Cataract extraction w/phaco Left 09/08/2014    Procedure: CATARACT EXTRACTION PHACO AND INTRAOCULAR LENS PLACEMENT LEFT EYE CDE=8.54;  Surgeon: Tonny Branch, MD;  Location: [facility] ORS;  Service: Ophthalmology;  Laterality: Left;  . Abdominal aortagram N/A 05/15/2012    Procedure: ABDOMINAL Maxcine Ham;  Surgeon: Elam Dutch, MD;  Location: [facility] CATH LAB;  Service: Cardiovascular;  Laterality: N/A;  . Ct radiation therapy guide Right Aug. 2015    Radiation Tx- Right Breast    No Known Allergies  Current Outpatient Prescriptions  Medication Sig Dispense Refill  . aspirin EC 81 MG tablet Take 81 mg by mouth daily.    . calcium carbonate (TUMS - DOSED IN MG ELEMENTAL CALCIUM) 500 MG chewable tablet Chew 1 tablet by mouth 2 (two) times daily as needed for indigestion or heartburn.    . clopidogrel (PLAVIX) 75 MG tablet TAKE 1 TABLET BY MOUTH EVERY DAY 30 tablet 0  . docusate sodium (COLACE) 100 MG capsule Take 100 mg by mouth 2 (two) times daily.    . furosemide (LASIX) 40 MG tablet Take 40 mg by mouth daily.      Marland Kitchen glimepiride (AMARYL) 2 MG tablet Take 2 mg by mouth 2 (two) times daily.    Marland Kitchen HUMALOG KWIKPEN 100 UNIT/ML injection Inject 3-10 Units into the skin 3 (three)  times daily. Per sliding scale.    Marland Kitchen HYDROcodone-acetaminophen (NORCO/VICODIN) 5-325 MG per tablet Take 1 tablet by mouth 2 (two) times daily.     Marland Kitchen ibuprofen (ADVIL,MOTRIN) 200 MG tablet Take 400 mg by mouth every 6 (six) hours as needed for headache.    . insulin glargine (LANTUS) 100 UNIT/ML injection Inject 40-50 Units into the skin 2 (two) times daily.     . methocarbamol (ROBAXIN) 500 MG tablet Take 500 mg by mouth every 8 (eight) hours as needed for muscle spasms.     Marland Kitchen  metoCLOPramide (REGLAN) 5 MG tablet Take 5 mg by mouth 3 (three) times daily.     . metoprolol tartrate (LOPRESSOR) 25 MG tablet Take 1 tablet (25 mg total) by mouth 2 (two) times daily. 60 tablet 1  . nitroGLYCERIN (NITROSTAT) 0.4 MG SL tablet Place 0.4 mg under the tongue every 5 (five) minutes as needed.    . ondansetron (ZOFRAN) 4 MG tablet Take 4 mg by mouth every 8 (eight) hours as needed. Nausea     . pantoprazole (PROTONIX) 40 MG tablet Take 40 mg by mouth daily.      Marland Kitchen sulfamethoxazole-trimethoprim (BACTRIM DS) 800-160 MG per tablet Take 1 tablet by mouth 2 (two) times daily as needed (yeast infection).     . traMADol (ULTRAM) 50 MG tablet Take 50-100 mg by mouth every 6 (six) hours as needed for moderate pain.      No current facility-administered medications for this visit.    ROS: See HPI for pertinent positives and negatives.   Physical Examination  Filed Vitals:   11/14/14 1210  BP: 132/72  Pulse: 103  Resp: 16  Height: 5\' 3"  (1.6 m)  Weight: 212 lb (96.163 kg)  SpO2: 97%   Body mass index is 37.56 kg/(m^2).  General: WDWN in NAD, obese female  Gait: in wheelchair HENT: WNL Eyes: Pupils equal Pulmonary: normal non-labored breathing , without Rales, rhonchi or wheezing Cardiac: RRR, no murmur detected  Abdomen: soft, NT, no palpable masses Skin: no rashes, see extremities.  VASCULAR EXAM  Carotid Bruits Left Right   Negative Negative  Radial pulses are 2+ palpable and  = Aorta is not palpable   VASCULAR EXAM: Extremities with ischemic changes as follows:  without Gangrene; without open wounds; without drainage. Healing shaved callous on left medial great toe. Mild erythema left 4th toe with shoe rubbing appearance.     LE Pulses LEFT RIGHT   FEMORAL  palpable  palpable    POPLITEAL not palpable  not palpable   POSTERIOR TIBIAL not palpable  not palpable    DORSALIS PEDIS  ANTERIOR TIBIAL not palpable  not palpable      Musculoskeletal: no muscle wasting or atrophy. Neurologic: A&O X 3; Appropriate Affect ;  SENSATION: normal; MOTOR FUNCTION: 5/5 Symmetric, CN 2-12 intact Speech is fluent/normal    Non-Invasive Vascular Imaging: DATE: 11/08/14 LOWER EXTREMITY ARTERIAL EVALUATION    INDICATION: Follow-up bilateral lower extremity peripheral vascular disease    PREVIOUS INTERVENTION(S): Right femoropopliteal arterial bypass graft placed 05/29/2012 Left superficial femoral artery stent placed 01/12/2010 Left popliteal/ PT trunk endarterectomy and angioplasty 11/30/2010.    DUPLEX EXAM: Duplex evaluation of the lower extremity arterial system to include the common femoral, superficial femoral, popliteal, and tibial arteries and bypass graft(s) and/or stent(s) if present.      FINDINGS:                                       RIGHT LOWER EXTREMITY:  Patent right femoropopliteal arterial bypass graft without evidence of disease.  Stenosis is observed in the native popliteal artery outflow with increased velocities and a velocity ratio of 1.8.    LEFT LOWER EXTREMITY:  Patent stent without evidence of in-stent stenosis.  Significant stenosis is observed in the native superficial femoral artery inflow approx. 2.8cm proximal to the stent entry.  Velocities are elevated with a ratio of 6.1.  No flow can be appreciated in  the PT trunk.        See the attached diagram for velocities.  IMPRESSION:  1. Widely patent right femoropopliteal arterial bypass graft without evidence of restenosis or hyperplasia. 2. <50% stenosis of the right native popliteal artery. 3. Significant (>50%) stenosis is observed in the proximal left superficial femoral artery. 4. Widely patent left superficial femoral artery stent without evidence of restenosis or hyperplasia.   5. No flow is observed in the left PT trunk; suspect occlusion.    ANKLE/BRACHIAL INDEX - Right = 0.92                 Left =   0.63        (Please see complete report)                 ASSESSMENT: [name] is a 64 y.o. female who is s/p right femoral popliteal bypass graft on 05/29/2012 and left SFA stent on 01/12/2010, left popliteal/tibioperoneal trunk atherectomy w/percutaneous transluminal angioplasty 11/30/2010.  Elevated velocity present involving the proximal left superficial femoral artery suggestive of greater than 50% stenosis, today at 449 cm/sec (highest velocity at this level was 442 six months ago, was 511 a year ago).  Patent stent without evidence of in-stent stenosis.  Significant stenosis is observed in the native superficial femoral artery inflow approx. 2.8cm proximal to the stent entry. Velocities are elevated with a ratio of 6.1.  No flow can be appreciated in the left PT trunk.   Widely patent right femoropopliteal arterial bypass graft without evidence of restenosis or hyperplasia. <50% stenosis of the right native popliteal artery.    PLAN:  Maximal medical management of atherosclerotic risk factors: DM, etc. Seated leg exercises daily as discussed and demonstrated.  I discussed in depth with the patient the nature of atherosclerosis, and emphasized the importance of maximal medical management including strict control of blood pressure,  blood glucose, and lipid levels, obtaining regular exercise, and continued cessation of smoking.  The patient is aware that without maximal medical management the underlying atherosclerotic disease process will progress, limiting the benefit of any interventions.  Based on the patient's vascular studies and examination, and after discussing with Dr. Trula Slade,  pt will be scheduled for angiogram of LE's with bilateral run-off on 11/29/14 with Dr. Trula Slade; cannulate right groin with planned intervention on the left lower extremity.  Return in 18 months for carotid Duplex. She should have this angiogram before she has the left second toe amputation.   The patient was given information about PAD including signs, symptoms, treatment, what symptoms should prompt the patient to seek immediate medical care, and risk reduction measures to take.  Clemon Chambers, RN, MSN, FNP-C [facility] Phone: [phone]  Clinic MD: Trula Slade  11/14/2014 12:22 PM

## 2014-11-16 ENCOUNTER — Other Ambulatory Visit: Payer: Self-pay

## 2014-11-16 ENCOUNTER — Encounter: Payer: Medicare Other | Admitting: Genetic Counselor

## 2014-11-16 ENCOUNTER — Telehealth: Payer: Self-pay | Admitting: *Deleted

## 2014-11-16 ENCOUNTER — Other Ambulatory Visit: Payer: Medicare Other

## 2014-11-16 NOTE — Telephone Encounter (Signed)
Pt called stating that she needed to go to another doctor's appt and wanted to cancel.  She will call me back later this week to reschedule.

## 2014-11-23 ENCOUNTER — Encounter (HOSPITAL_COMMUNITY): Payer: Self-pay | Admitting: Pharmacy Technician

## 2014-11-28 MED ORDER — SODIUM CHLORIDE 0.9 % IV SOLN: INTRAVENOUS | Status: DC

## 2014-11-29 ENCOUNTER — Encounter (HOSPITAL_COMMUNITY): Payer: Self-pay | Admitting: Surgery

## 2014-11-29 ENCOUNTER — Encounter (HOSPITAL_COMMUNITY)
Admission: RE | Disposition: A | Discharge status: Home / Self Care | Payer: Self-pay | Source: Ambulatory Visit | Attending: Surgery

## 2014-11-29 ENCOUNTER — Ambulatory Visit (HOSPITAL_COMMUNITY)
Admission: RE | Admit: 2014-11-29 | Discharge: 2014-11-29 | Disposition: A | Discharge status: Home / Self Care | Payer: Medicare Other | Source: Ambulatory Visit | Attending: Surgery | Admitting: Surgery

## 2014-11-29 ENCOUNTER — Telehealth: Payer: Self-pay | Admitting: Surgery

## 2014-11-29 DIAGNOSIS — Z955 Presence of coronary angioplasty implant and graft: Secondary | ICD-10-CM | POA: Diagnosis not present

## 2014-11-29 DIAGNOSIS — I70245 Atherosclerosis of native arteries of left leg with ulceration of other part of foot: Secondary | ICD-10-CM | POA: Diagnosis not present

## 2014-11-29 DIAGNOSIS — G629 Polyneuropathy, unspecified: Secondary | ICD-10-CM | POA: Diagnosis not present

## 2014-11-29 DIAGNOSIS — M810 Age-related osteoporosis without current pathological fracture: Secondary | ICD-10-CM | POA: Insufficient documentation

## 2014-11-29 DIAGNOSIS — I70244 Atherosclerosis of native arteries of left leg with ulceration of heel and midfoot: Secondary | ICD-10-CM

## 2014-11-29 DIAGNOSIS — Z7902 Long term (current) use of antithrombotics/antiplatelets: Secondary | ICD-10-CM | POA: Diagnosis not present

## 2014-11-29 DIAGNOSIS — Z7982 Long term (current) use of aspirin: Secondary | ICD-10-CM | POA: Diagnosis not present

## 2014-11-29 DIAGNOSIS — E785 Hyperlipidemia, unspecified: Secondary | ICD-10-CM | POA: Insufficient documentation

## 2014-11-29 DIAGNOSIS — Z794 Long term (current) use of insulin: Secondary | ICD-10-CM | POA: Diagnosis not present

## 2014-11-29 DIAGNOSIS — Z853 Personal history of malignant neoplasm of breast: Secondary | ICD-10-CM | POA: Insufficient documentation

## 2014-11-29 DIAGNOSIS — L97529 Non-pressure chronic ulcer of other part of left foot with unspecified severity: Secondary | ICD-10-CM | POA: Insufficient documentation

## 2014-11-29 DIAGNOSIS — K219 Gastro-esophageal reflux disease without esophagitis: Secondary | ICD-10-CM | POA: Insufficient documentation

## 2014-11-29 DIAGNOSIS — Z86718 Personal history of other venous thrombosis and embolism: Secondary | ICD-10-CM | POA: Diagnosis not present

## 2014-11-29 DIAGNOSIS — I2510 Atherosclerotic heart disease of native coronary artery without angina pectoris: Secondary | ICD-10-CM | POA: Diagnosis not present

## 2014-11-29 DIAGNOSIS — E119 Type 2 diabetes mellitus without complications: Secondary | ICD-10-CM | POA: Insufficient documentation

## 2014-11-29 DIAGNOSIS — I10 Essential (primary) hypertension: Secondary | ICD-10-CM | POA: Insufficient documentation

## 2014-11-29 DIAGNOSIS — I252 Old myocardial infarction: Secondary | ICD-10-CM | POA: Diagnosis not present

## 2014-11-29 DIAGNOSIS — Z87891 Personal history of nicotine dependence: Secondary | ICD-10-CM | POA: Insufficient documentation

## 2014-11-29 DIAGNOSIS — Z8673 Personal history of transient ischemic attack (TIA), and cerebral infarction without residual deficits: Secondary | ICD-10-CM | POA: Insufficient documentation

## 2014-11-29 DIAGNOSIS — Z6837 Body mass index (BMI) 37.0-37.9, adult: Secondary | ICD-10-CM | POA: Diagnosis not present

## 2014-11-29 DIAGNOSIS — I739 Peripheral vascular disease, unspecified: Secondary | ICD-10-CM | POA: Diagnosis not present

## 2014-11-29 DIAGNOSIS — G4733 Obstructive sleep apnea (adult) (pediatric): Secondary | ICD-10-CM | POA: Insufficient documentation

## 2014-11-29 HISTORY — PX: PERCUTANEOUS STENT INTERVENTION: SHX5500

## 2014-11-29 HISTORY — PX: LOWER EXTREMITY ANGIOGRAM: SHX5508

## 2014-11-29 HISTORY — PX: ABDOMINAL AORTAGRAM: SHX5454

## 2014-11-29 LAB — POCT I-STAT, CHEM 8
BUN: 9 mg/dL (ref 6–23)
CALCIUM ION: 1.1 mmol/L — AB (ref 1.13–1.30)
CHLORIDE: 102 meq/L (ref 96–112)
Creatinine, Ser: 0.7 mg/dL (ref 0.50–1.10)
Glucose, Bld: 172 mg/dL — ABNORMAL HIGH (ref 70–99)
HEMATOCRIT: 43 % (ref 36.0–46.0)
Hemoglobin: 14.6 g/dL (ref 12.0–15.0)
Potassium: 4 mmol/L (ref 3.5–5.1)
Sodium: 138 mmol/L (ref 135–145)
TCO2: 23 mmol/L (ref 0–100)

## 2014-11-29 LAB — GLUCOSE, CAPILLARY
GLUCOSE-CAPILLARY: 161 mg/dL — AB (ref 70–99)
Glucose-Capillary: 178 mg/dL — ABNORMAL HIGH (ref 70–99)

## 2014-11-29 LAB — POCT ACTIVATED CLOTTING TIME
Activated Clotting Time: 202 seconds
Activated Clotting Time: 165 seconds

## 2014-11-29 SURGERY — ABDOMINAL AORTAGRAM
Anesthesia: LOCAL

## 2014-11-29 MED ORDER — LIDOCAINE HCL (PF) 1 % IJ SOLN
INTRAMUSCULAR | Status: AC
  Filled 2014-11-29: qty 30

## 2014-11-29 MED ORDER — HEPARIN SODIUM (PORCINE) 1000 UNIT/ML IJ SOLN
INTRAMUSCULAR | Status: AC
  Filled 2014-11-29: qty 1

## 2014-11-29 MED ORDER — SODIUM CHLORIDE 0.9 % IV SOLN: 1.0000 mL/kg/h | INTRAVENOUS | Status: DC

## 2014-11-29 MED ORDER — HYDRALAZINE HCL 20 MG/ML IJ SOLN: 5.0000 mg | INTRAMUSCULAR | Status: DC | PRN

## 2014-11-29 MED ORDER — FENTANYL CITRATE 0.05 MG/ML IJ SOLN
INTRAMUSCULAR | Status: AC
  Filled 2014-11-29: qty 2

## 2014-11-29 MED ORDER — ONDANSETRON HCL 4 MG/2ML IJ SOLN: 4.0000 mg | Freq: Four times a day (QID) | INTRAMUSCULAR | Status: DC | PRN

## 2014-11-29 MED ORDER — MORPHINE SULFATE 10 MG/ML IJ SOLN: 2.0000 mg | INTRAMUSCULAR | Status: DC | PRN

## 2014-11-29 MED ORDER — GUAIFENESIN-DM 100-10 MG/5ML PO SYRP: 15.0000 mL | ORAL_SOLUTION | ORAL | Status: DC | PRN

## 2014-11-29 MED ORDER — OXYCODONE HCL 5 MG PO TABS
5.0000 mg | ORAL_TABLET | ORAL | Status: DC | PRN
  Administered 2014-11-29: 5 mg via ORAL
  Filled 2014-11-29: qty 2

## 2014-11-29 MED ORDER — LABETALOL HCL 5 MG/ML IV SOLN: 10.0000 mg | INTRAVENOUS | Status: DC | PRN

## 2014-11-29 MED ORDER — METOPROLOL TARTRATE 1 MG/ML IV SOLN: 2.0000 mg | INTRAVENOUS | Status: DC | PRN

## 2014-11-29 MED ORDER — HEPARIN (PORCINE) IN NACL 2-0.9 UNIT/ML-% IJ SOLN
INTRAMUSCULAR | Status: AC
  Filled 2014-11-29: qty 1000

## 2014-11-29 MED ORDER — PHENOL 1.4 % MT LIQD: 1.0000 | OROMUCOSAL | Status: DC | PRN

## 2014-11-29 MED ORDER — MIDAZOLAM HCL 2 MG/2ML IJ SOLN
INTRAMUSCULAR | Status: AC
  Filled 2014-11-29: qty 2

## 2014-11-29 MED ORDER — ACETAMINOPHEN 325 MG PO TABS: 325.0000 mg | ORAL_TABLET | ORAL | Status: DC | PRN

## 2014-11-29 MED ORDER — OXYCODONE HCL 5 MG PO TABS
ORAL_TABLET | ORAL | Status: AC
  Filled 2014-11-29: qty 1

## 2014-11-29 MED ORDER — ALUM & MAG HYDROXIDE-SIMETH 200-200-20 MG/5ML PO SUSP: 15.0000 mL | ORAL | Status: DC | PRN

## 2014-11-29 MED ORDER — ACETAMINOPHEN 325 MG RE SUPP: 325.0000 mg | RECTAL | Status: DC | PRN

## 2014-11-29 SURGICAL SUPPLY — 55 items
ADH SKN CLS APL DERMABOND .7 (GAUZE/BANDAGES/DRESSINGS) ×3
BANDAGE ELASTIC 4 VELCRO ST LF (GAUZE/BANDAGES/DRESSINGS) IMPLANT
BANDAGE ESMARK 6X9 LF (GAUZE/BANDAGES/DRESSINGS) IMPLANT
BNDG CMPR 9X6 STRL LF SNTH (GAUZE/BANDAGES/DRESSINGS)
BNDG ESMARK 6X9 LF (GAUZE/BANDAGES/DRESSINGS)
CANISTER SUCTION 2500CC (MISCELLANEOUS) ×4 IMPLANT
CLIP TI MEDIUM 24 (CLIP) ×4 IMPLANT
CLIP TI WIDE RED SMALL 24 (CLIP) ×4 IMPLANT
COVER SURGICAL LIGHT HANDLE (MISCELLANEOUS) ×4 IMPLANT
CUFF TOURNIQUET SINGLE 24IN (TOURNIQUET CUFF) IMPLANT
CUFF TOURNIQUET SINGLE 34IN LL (TOURNIQUET CUFF) IMPLANT
CUFF TOURNIQUET SINGLE 44IN (TOURNIQUET CUFF) IMPLANT
DERMABOND ADVANCED (GAUZE/BANDAGES/DRESSINGS) ×1
DERMABOND ADVANCED .7 DNX12 (GAUZE/BANDAGES/DRESSINGS) ×3 IMPLANT
DRAIN CHANNEL 15F RND FF W/TCR (WOUND CARE) IMPLANT
DRAPE WARM FLUID 44X44 (DRAPE) ×4 IMPLANT
DRAPE X-RAY CASS 24X20 (DRAPES) IMPLANT
DRSG COVADERM 4X10 (GAUZE/BANDAGES/DRESSINGS) IMPLANT
DRSG COVADERM 4X8 (GAUZE/BANDAGES/DRESSINGS) IMPLANT
ELECT REM PT RETURN 9FT ADLT (ELECTROSURGICAL) ×4
ELECTRODE REM PT RTRN 9FT ADLT (ELECTROSURGICAL) ×3 IMPLANT
EVACUATOR SILICONE 100CC (DRAIN) IMPLANT
GLOVE BIOGEL PI IND STRL 7.5 (GLOVE) ×3 IMPLANT
GLOVE BIOGEL PI INDICATOR 7.5 (GLOVE) ×1
GLOVE SURG SS PI 7.5 STRL IVOR (GLOVE) ×4 IMPLANT
GOWN PREVENTION PLUS XXLARGE (GOWN DISPOSABLE) ×4 IMPLANT
GOWN STRL NON-REIN LRG LVL3 (GOWN DISPOSABLE) ×12 IMPLANT
HEMOSTAT SNOW SURGICEL 2X4 (HEMOSTASIS) IMPLANT
KIT BASIN OR (CUSTOM PROCEDURE TRAY) ×4 IMPLANT
KIT ROOM TURNOVER OR (KITS) ×4 IMPLANT
MARKER GRAFT CORONARY BYPASS (MISCELLANEOUS) IMPLANT
NS IRRIG 1000ML POUR BTL (IV SOLUTION) ×8 IMPLANT
PACK PERIPHERAL VASCULAR (CUSTOM PROCEDURE TRAY) ×4 IMPLANT
PAD ARMBOARD 7.5X6 YLW CONV (MISCELLANEOUS) ×8 IMPLANT
PADDING CAST COTTON 6X4 STRL (CAST SUPPLIES) IMPLANT
SET COLLECT BLD 21X3/4 12 (NEEDLE) IMPLANT
STOPCOCK 4 WAY LG BORE MALE ST (IV SETS) IMPLANT
SUT ETHILON 3 0 PS 1 (SUTURE) IMPLANT
SUT PROLENE 5 0 C 1 24 (SUTURE) ×4 IMPLANT
SUT PROLENE 6 0 BV (SUTURE) ×4 IMPLANT
SUT PROLENE 7 0 BV 1 (SUTURE) IMPLANT
SUT SILK 2 0 SH (SUTURE) ×4 IMPLANT
SUT SILK 3 0 (SUTURE)
SUT SILK 3-0 18XBRD TIE 12 (SUTURE) IMPLANT
SUT VIC AB 2-0 CT1 27 (SUTURE) ×8
SUT VIC AB 2-0 CT1 TAPERPNT 27 (SUTURE) ×6 IMPLANT
SUT VIC AB 3-0 SH 27 (SUTURE) ×8
SUT VIC AB 3-0 SH 27X BRD (SUTURE) ×6 IMPLANT
SUT VICRYL 4-0 PS2 18IN ABS (SUTURE) ×8 IMPLANT
TOWEL OR 17X24 6PK STRL BLUE (TOWEL DISPOSABLE) ×8 IMPLANT
TOWEL OR 17X26 10 PK STRL BLUE (TOWEL DISPOSABLE) ×8 IMPLANT
TRAY FOLEY CATH 16FRSI W/METER (SET/KITS/TRAYS/PACK) ×4 IMPLANT
TUBING EXTENTION W/L.L. (IV SETS) IMPLANT
UNDERPAD 30X30 INCONTINENT (UNDERPADS AND DIAPERS) ×4 IMPLANT
WATER STERILE IRR 1000ML POUR (IV SOLUTION) ×4 IMPLANT

## 2014-11-29 NOTE — Discharge Instructions (Signed)

## 2014-11-29 NOTE — H&P (View-Only) (Signed)
[facility] [place] HISTORY AND PHYSICAL -PAD  History of Present Illness [name] is a 64 y.o. female  patient of Dr. Kellie Simmering who returns for continued followup regarding a right femoral popliteal bypass graft on 05/29/2012 and left SFA stent on 01/12/2010, left popliteal/tibioperoneal trunk atherectomy w/percutaneous transluminal angioplasty 11/30/2010, and also for carotid Duplex. She had evidence of some elevated velocity in the mid portion of the vein graft in the past.  Pt. Denies claudication symptoms, has pain in left foot,attributes to neuropathy, and right leg pain hurts no worse with walking; but she does not walk much due to feet and low back pain.  She has been seen at the [facility] wound center for left second toe tip ulcer which has improved per pt and son, but pt states her podiatrist told her that she has infection in the bone of the tip of the left second toe and needs partial amputation oft this toe.  Pt states her podiatrist wants the results of today's PAD testing.  Had a stroke in 2012 manifested by right leg and arm weakness, has improved about 50% per grand-daughter as she was not able to walk after her stroke. She also has tingling in right hand and lateral asect right leg.  [facility] for CAD.  Patient reports New Medical or Surgical History: recently diagnosed with thyroid nodules, had thyroid ablation; also recently diagnosed with osteoporosis.  Recurrence of breast cancer, was left, now right.  Has healing ulcer on dorsal aspect right second toe; pt reports that ulcer improved on antibiotic prescribed by her PCP. She walks very little, uses a walker, has intermittent radiculopathy pain bilaterally with walking. Is not using stationary bike, states she cannot navigate this, not walking much due to pain in feet and legs.  Pt Diabetic: Yes, states her blood sugar is about 200. Pt smoker: former smoker, quit in 2011  Pt meds  include:  Statin :Yes  Betablocker: Yes  ASA: Yes  Other anticoagulants/antiplatelets: Plavix     Past Medical History  Diagnosis Date  . GERD (gastroesophageal reflux disease)   . PAD (peripheral artery disease)     Stent, embolectomy, surgery  /     Dr. Oneida Alar, ABI 0.52 right, 0.54 left, February, 2010  . Systolic and diastolic CHF, chronic   . CAD (coronary artery disease)     June, 2012, DES to RCA, staged DES to circumflex and DES to diagonal with pressure wire to LAD, FFR 0.84  . DM (diabetes mellitus), type 2   . Hypertension   . Morbid obesity   . Hyperlipidemia   . Ejection fraction < 50%     EF 45-50%, echo, June, 2012  . Diastolic dysfunction     grade 2, echo, June, 2012  . Peripheral neuropathy   . Tobacco abuse   . Syncope     vasovagal  . DVT (deep venous thrombosis)   . Shortness of breath     with exertion  . Stroke Dec. 2012    Right side  . 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 OSA (obstructive sleep apnea)     Dr. Halford Chessman  CPAP started August, 2012. pt unable to tolerate adn PCP aware.  . Breast cancer 2002    lumpectomy and radiation LEFT  . Breast cancer Aug. 2015    Right Breast:  Radiation    Social History History  Substance Use Topics  . Smoking status: Former Smoker --  1.50 packs/day for 44 years    Types: Cigarettes    Quit date: 11/30/2010  . Smokeless tobacco: Never Used  . Alcohol Use: No    Family History Family History  Problem Relation Age of Onset  . Hypertension Sister   . Diabetes Sister   . Hyperlipidemia Sister   . Cancer Sister   . Hyperlipidemia Sister   . Hyperlipidemia Brother   . Diabetes Brother   . Lung cancer Father   . Cancer Father   . Heart disease Father   . Hypertension Father   . Diabetes Father   . Stomach cancer Sister   . Diabetes Daughter     Past Surgical History  Procedure Laterality Date  . Appendectomy    . Cholecystectomy    . Tubal ligation    . Breast surgery      Left breast  cancer  . Cardiac catheterization  2002  . Popliteal artery angioplasty  11/30/10    Left leg w/ embolectomy, endarterectomy  by Dr.Charles Fields and 3-4 stents  . Coronary angioplasty with stent placement    . Pr vein bypass graft,aorto-fem-pop    . Femoral-popliteal bypass graft  05/29/2012    Procedure: BYPASS GRAFT FEMORAL-POPLITEAL ARTERY;  Surgeon: Mal Misty, MD;  Location: [facility] OR;  Service: Vascular;  Laterality: Right;  Right Common femoral artery to above knee popliteal artery bypass graft using non-reversed saphenous vein  . Intraoperative arteriogram  05/29/2012    Procedure: INTRA OPERATIVE ARTERIOGRAM;  Surgeon: Mal Misty, MD;  Location: [facility];  Service: Vascular;  Laterality: Right;  . Breast biopsy Right   . Cataract extraction w/phaco Left 09/08/2014    Procedure: CATARACT EXTRACTION PHACO AND INTRAOCULAR LENS PLACEMENT LEFT EYE CDE=8.54;  Surgeon: Tonny Branch, MD;  Location: [facility];  Service: Ophthalmology;  Laterality: Left;  . Abdominal aortagram N/A 05/15/2012    Procedure: ABDOMINAL Maxcine Ham;  Surgeon: Elam Dutch, MD;  Location: [facility] CATH LAB;  Service: Cardiovascular;  Laterality: N/A;  . Ct radiation therapy guide Right Aug. 2015    Radiation Tx- Right Breast    No Known Allergies  Current Outpatient Prescriptions  Medication Sig Dispense Refill  . aspirin EC 81 MG tablet Take 81 mg by mouth daily.    . calcium carbonate (TUMS - DOSED IN MG ELEMENTAL CALCIUM) 500 MG chewable tablet Chew 1 tablet by mouth 2 (two) times daily as needed for indigestion or heartburn.    . clopidogrel (PLAVIX) 75 MG tablet TAKE 1 TABLET BY MOUTH EVERY DAY 30 tablet 0  . docusate sodium (COLACE) 100 MG capsule Take 100 mg by mouth 2 (two) times daily.    . furosemide (LASIX) 40 MG tablet Take 40 mg by mouth daily.      Marland Kitchen glimepiride (AMARYL) 2 MG tablet Take 2 mg by mouth 2 (two) times daily.    Marland Kitchen HUMALOG KWIKPEN 100 UNIT/ML injection Inject 3-10 Units into the skin 3 (three)  times daily. Per sliding scale.    Marland Kitchen HYDROcodone-acetaminophen (NORCO/VICODIN) 5-325 MG per tablet Take 1 tablet by mouth 2 (two) times daily.     Marland Kitchen ibuprofen (ADVIL,MOTRIN) 200 MG tablet Take 400 mg by mouth every 6 (six) hours as needed for headache.    . insulin glargine (LANTUS) 100 UNIT/ML injection Inject 40-50 Units into the skin 2 (two) times daily.     . methocarbamol (ROBAXIN) 500 MG tablet Take 500 mg by mouth every 8 (eight) hours as needed for muscle spasms.     Marland Kitchen  metoCLOPramide (REGLAN) 5 MG tablet Take 5 mg by mouth 3 (three) times daily.     . metoprolol tartrate (LOPRESSOR) 25 MG tablet Take 1 tablet (25 mg total) by mouth 2 (two) times daily. 60 tablet 1  . nitroGLYCERIN (NITROSTAT) 0.4 MG SL tablet Place 0.4 mg under the tongue every 5 (five) minutes as needed.    . ondansetron (ZOFRAN) 4 MG tablet Take 4 mg by mouth every 8 (eight) hours as needed. Nausea     . pantoprazole (PROTONIX) 40 MG tablet Take 40 mg by mouth daily.      Marland Kitchen sulfamethoxazole-trimethoprim (BACTRIM DS) 800-160 MG per tablet Take 1 tablet by mouth 2 (two) times daily as needed (yeast infection).     . traMADol (ULTRAM) 50 MG tablet Take 50-100 mg by mouth every 6 (six) hours as needed for moderate pain.      No current facility-administered medications for this visit.    ROS: See HPI for pertinent positives and negatives.   Physical Examination  Filed Vitals:   11/14/14 1210  BP: 132/72  Pulse: 103  Resp: 16  Height: 5\' 3"  (1.6 m)  Weight: 212 lb (96.163 kg)  SpO2: 97%   Body mass index is 37.56 kg/(m^2).  General: WDWN in NAD, obese female  Gait: in wheelchair HENT: WNL Eyes: Pupils equal Pulmonary: normal non-labored breathing , without Rales, rhonchi or wheezing Cardiac: RRR, no murmur detected  Abdomen: soft, NT, no palpable masses Skin: no rashes, see extremities.  VASCULAR EXAM  Carotid Bruits Left Right   Negative Negative  Radial pulses are 2+ palpable and  = Aorta is not palpable   VASCULAR EXAM: Extremities with ischemic changes as follows:  without Gangrene; without open wounds; without drainage. Healing shaved callous on left medial great toe. Mild erythema left 4th toe with shoe rubbing appearance.     LE Pulses LEFT RIGHT   FEMORAL  palpable  palpable    POPLITEAL not palpable  not palpable   POSTERIOR TIBIAL not palpable  not palpable    DORSALIS PEDIS  ANTERIOR TIBIAL not palpable  not palpable      Musculoskeletal: no muscle wasting or atrophy. Neurologic: A&O X 3; Appropriate Affect ;  SENSATION: normal; MOTOR FUNCTION: 5/5 Symmetric, CN 2-12 intact Speech is fluent/normal    Non-Invasive Vascular Imaging: DATE: 11/08/14 LOWER EXTREMITY ARTERIAL EVALUATION    INDICATION: Follow-up bilateral lower extremity peripheral vascular disease    PREVIOUS INTERVENTION(S): Right femoropopliteal arterial bypass graft placed 05/29/2012 Left superficial femoral artery stent placed 01/12/2010 Left popliteal/ PT trunk endarterectomy and angioplasty 11/30/2010.    DUPLEX EXAM: Duplex evaluation of the lower extremity arterial system to include the common femoral, superficial femoral, popliteal, and tibial arteries and bypass graft(s) and/or stent(s) if present.      FINDINGS:                                       RIGHT LOWER EXTREMITY:  Patent right femoropopliteal arterial bypass graft without evidence of disease.  Stenosis is observed in the native popliteal artery outflow with increased velocities and a velocity ratio of 1.8.    LEFT LOWER EXTREMITY:  Patent stent without evidence of in-stent stenosis.  Significant stenosis is observed in the native superficial femoral artery inflow approx. 2.8cm proximal to the stent entry.  Velocities are elevated with a ratio of 6.1.  No flow can be appreciated in  the PT trunk.        See the attached diagram for velocities.  IMPRESSION:  1. Widely patent right femoropopliteal arterial bypass graft without evidence of restenosis or hyperplasia. 2. <50% stenosis of the right native popliteal artery. 3. Significant (>50%) stenosis is observed in the proximal left superficial femoral artery. 4. Widely patent left superficial femoral artery stent without evidence of restenosis or hyperplasia.   5. No flow is observed in the left PT trunk; suspect occlusion.    ANKLE/BRACHIAL INDEX - Right = 0.92                 Left =   0.63        (Please see complete report)                 ASSESSMENT: JULIEANNA GERACI is a 64 y.o. female who is s/p right femoral popliteal bypass graft on 05/29/2012 and left SFA stent on 01/12/2010, left popliteal/tibioperoneal trunk atherectomy w/percutaneous transluminal angioplasty 11/30/2010.  Elevated velocity present involving the proximal left superficial femoral artery suggestive of greater than 50% stenosis, today at 449 cm/sec (highest velocity at this level was 442 six months ago, was 511 a year ago).  Patent stent without evidence of in-stent stenosis.  Significant stenosis is observed in the native superficial femoral artery inflow approx. 2.8cm proximal to the stent entry. Velocities are elevated with a ratio of 6.1.  No flow can be appreciated in the left PT trunk.   Widely patent right femoropopliteal arterial bypass graft without evidence of restenosis or hyperplasia. <50% stenosis of the right native popliteal artery.    PLAN:  Maximal medical management of atherosclerotic risk factors: DM, etc. Seated leg exercises daily as discussed and demonstrated.  I discussed in depth with the patient the nature of atherosclerosis, and emphasized the importance of maximal medical management including strict control of blood pressure,  blood glucose, and lipid levels, obtaining regular exercise, and continued cessation of smoking.  The patient is aware that without maximal medical management the underlying atherosclerotic disease process will progress, limiting the benefit of any interventions.  Based on the patient's vascular studies and examination, and after discussing with Dr. Trula Slade,  pt will be scheduled for angiogram of LE's with bilateral run-off on 11/29/14 with Dr. Trula Slade; cannulate right groin with planned intervention on the left lower extremity.  Return in 18 months for carotid Duplex. She should have this angiogram before she has the left second toe amputation.   The patient was given information about PAD including signs, symptoms, treatment, what symptoms should prompt the patient to seek immediate medical care, and risk reduction measures to take.  Clemon Chambers, RN, MSN, FNP-C [facility] Phone: [phone]  Clinic MD: Trula Slade  11/14/2014 12:22 PM

## 2014-11-29 NOTE — Progress Notes (Signed)
Pt assisted to bathroom without diff.  Dsg to right groin remains dry and intact with doppler to dorsalis pedis positive.

## 2014-11-29 NOTE — Progress Notes (Signed)
Site area: rfa Site Prior to Removal:  Level 0 Pressure Applied For:20 minutes Manual:   yes Patient Status During Pull: stable  Post Pull Site:  Level 0 Post Pull Instructions Given: yes  Post Pull Pulses Present: yes rt dp present with doppler Dressing Applied:  Yes tegaderm with gauze Bedrest begins @ 11:13 Comments:patient tolerated well

## 2014-11-29 NOTE — Interval H&P Note (Signed)
History and Physical Interval Note:  11/29/2014 8:58 AM  [name]  has presented today for surgery, with the diagnosis of PVD  The various methods of treatment have been discussed with the patient and family. After consideration of risks, benefits and other options for treatment, the patient has consented to  Procedure(s): ABDOMINAL AORTAGRAM (N/A) LOWER EXTREMITY ANGIOGRAM (Bilateral) as a surgical intervention .  The patient's history has been reviewed, patient examined, no change in status, stable for surgery.  I have reviewed the patient's chart and labs.  Questions were answered to the patient's satisfaction.     BRABHAM IV, V. WELLS

## 2014-11-29 NOTE — Progress Notes (Signed)
Pt is now ready to leave, ride has arrived. Leave via wc.

## 2014-11-29 NOTE — Op Note (Signed)
Patient name: [name] MRN: [id number] DOB: 05-21-1951 Sex: female  11/29/2014 Pre-operative Diagnosis: Left foot ulcer Post-operative diagnosis:  Same Surgeon:  Eldridge Abrahams Procedure Performed:  1.  Ultrasound-guided access, right femoral artery  2.  Abdominal aortogram  3.  Bilateral lower extremity runoff  4.  Stent, left superficial femoral artery   Indications:  The patient is previously undergone a right femoral-popliteal bypass graft and left superficial femoral artery stenting.  Ultrasound identified a high-grade stenosis proximal to the stent on the left leg where she also has a ulcer.  She is here for further evaluation.  Procedure:  The patient was identified in the holding area and taken to room 8.  The patient was then placed supine on the table and prepped and draped in the usual sterile fashion.  A time out was called.  Ultrasound was used to evaluate the right common femoral artery.  It was patent .  A digital ultrasound image was acquired.  A micropuncture needle was used to access the right common femoral artery under ultrasound guidance.  An 018 wire was advanced without resistance and a micropuncture sheath was placed.  The 018 wire was removed and a benson wire was placed.  The micropuncture sheath was exchanged for a 5 french sheath.  An omniflush catheter was advanced over the wire to the level of L-1.  An abdominal angiogram was obtained.  Next, using the omniflush catheter and a benson wire, the aortic bifurcation was crossed and the catheter was placed into theleft external iliac artery and left runoff was obtained.  right runoff was performed via retrograde sheath injections.  Findings:   Aortogram:  No significant renal artery stenosis is identified.  The infrarenal abdominal aorta is widely patent.  Bilateral common and external iliac arteries are widely patent.  Right Lower Extremity:  Right femoral-popliteal bypass graft is widely patent.  No  evidence of anastomotic stenosis.  Tibial vessels were not well visualized secondary to patient movement.  Left Lower Extremity:  Left common femoral artery and profunda femoral artery are widely patent.  There is a 95% stenosis in the proximal SFA, proximal to the superficial femoral artery stents which are widely patent.  There is occlusion of the popliteal artery at the level of the joint space with reconstitution of all 3 tibial vessels at their origin.  There is diffuse tibial disease.  The anterior tibial artery crosses the ankle but has significant stenosis proximally.  The posterior tibial artery occludes at the ankle.  Intervention:  After the above images were acquired, the decision was made to proceed with intervention.  A 6 French sheath was inserted into the left external iliac artery.  The patient was fully heparinized.  A woolly wire was easily advanced across the lesion.  A Cordis 6 x 60 self-expanding stent was deployed with 1 cm overlap over the existing stents and successfully covering the lesion.  The lesion was molded with a 5 mm balloon.  Completion imaging revealed resolution of the stenosis and no change in runoff.  The sheath was withdrawn to the right external iliac artery.  Wire was removed.  The patient taken the holding area for sheath pull once her coag profile corrects.  Impression:  #1  successful stenting of a 95% stenosis within the proximal superficial femoral artery using a 6 mm Cordis stent  #2  widely patent right femoral-popliteal bypass graft  #3  occluded left popliteal artery, unchanged from previous study  Theotis Burrow, M.D. [facility] [place] Office: [phone] Pager:  [phone]

## 2014-11-29 NOTE — Telephone Encounter (Signed)
Spoke with pts daughter, mailed letter, dpm

## 2014-11-29 NOTE — Telephone Encounter (Signed)
-----   Message from Mena Goes, RN sent at 11/29/2014 10:05 AM EST ----- Regarding: Schedule   ----- Message -----    From: Serafina Mitchell, MD    Sent: 11/29/2014  10:03 AM      To: [facility] Charge Pool  11/29/2014:  Surgeon:  Eldridge Abrahams Procedure Performed:  1.  Ultrasound-guided access, right femoral artery  2.  Abdominal aortogram  3.  Bilateral lower extremity runoff  4.  Stent, left superficial femoral artery   Follow-up with Vinnie Level in 6 weeks for a wound check

## 2015-01-09 ENCOUNTER — Encounter: Payer: Self-pay | Admitting: Family

## 2015-01-10 ENCOUNTER — Ambulatory Visit: Payer: Medicare Other | Admitting: Family

## 2015-01-26 ENCOUNTER — Ambulatory Visit: Payer: Medicare Other | Admitting: Family

## 2015-01-30 ENCOUNTER — Encounter: Payer: Medicare Other | Admitting: Surgery

## 2015-02-20 ENCOUNTER — Encounter: Payer: Medicare Other | Admitting: Surgery

## 2015-03-24 ENCOUNTER — Encounter: Payer: Self-pay | Admitting: Surgery

## 2015-03-27 ENCOUNTER — Encounter: Payer: Medicare Other | Admitting: Surgery

## 2015-05-23 ENCOUNTER — Other Ambulatory Visit (HOSPITAL_COMMUNITY): Payer: Medicare Other

## 2015-05-23 ENCOUNTER — Ambulatory Visit: Payer: Medicare Other | Admitting: Family

## 2015-05-23 ENCOUNTER — Encounter (HOSPITAL_COMMUNITY): Payer: Medicare Other

## 2015-07-19 ENCOUNTER — Other Ambulatory Visit (HOSPITAL_COMMUNITY): Payer: Self-pay | Admitting: Internal Medicine

## 2015-07-19 DIAGNOSIS — C50919 Malignant neoplasm of unspecified site of unspecified female breast: Secondary | ICD-10-CM

## 2015-08-01 ENCOUNTER — Ambulatory Visit (HOSPITAL_COMMUNITY)
Admission: RE | Admit: 2015-08-01 | Discharge: 2015-08-01 | Disposition: A | Discharge status: Home / Self Care | Payer: Medicare Other | Source: Ambulatory Visit | Attending: Internal Medicine | Admitting: Internal Medicine

## 2015-08-01 DIAGNOSIS — I672 Cerebral atherosclerosis: Secondary | ICD-10-CM | POA: Insufficient documentation

## 2015-08-01 DIAGNOSIS — C771 Secondary and unspecified malignant neoplasm of intrathoracic lymph nodes: Secondary | ICD-10-CM | POA: Diagnosis not present

## 2015-08-01 DIAGNOSIS — C7801 Secondary malignant neoplasm of right lung: Secondary | ICD-10-CM | POA: Insufficient documentation

## 2015-08-01 DIAGNOSIS — C7802 Secondary malignant neoplasm of left lung: Secondary | ICD-10-CM | POA: Insufficient documentation

## 2015-08-01 DIAGNOSIS — C7951 Secondary malignant neoplasm of bone: Secondary | ICD-10-CM | POA: Diagnosis not present

## 2015-08-01 DIAGNOSIS — D3502 Benign neoplasm of left adrenal gland: Secondary | ICD-10-CM | POA: Insufficient documentation

## 2015-08-01 DIAGNOSIS — I2510 Atherosclerotic heart disease of native coronary artery without angina pectoris: Secondary | ICD-10-CM | POA: Diagnosis not present

## 2015-08-01 DIAGNOSIS — K449 Diaphragmatic hernia without obstruction or gangrene: Secondary | ICD-10-CM | POA: Diagnosis not present

## 2015-08-01 DIAGNOSIS — Z923 Personal history of irradiation: Secondary | ICD-10-CM | POA: Diagnosis not present

## 2015-08-01 DIAGNOSIS — I700 Atherosclerosis of aorta: Secondary | ICD-10-CM | POA: Diagnosis not present

## 2015-08-01 DIAGNOSIS — C50919 Malignant neoplasm of unspecified site of unspecified female breast: Secondary | ICD-10-CM | POA: Insufficient documentation

## 2015-08-01 LAB — GLUCOSE, CAPILLARY: Glucose-Capillary: 118 mg/dL — ABNORMAL HIGH (ref 65–99)

## 2015-08-01 MED ORDER — FLUDEOXYGLUCOSE F - 18 (FDG) INJECTION
11.2000 | Freq: Once | INTRAVENOUS | Status: DC | PRN
Start: 2015-08-01 — End: 2015-08-07

## 2015-08-24 ENCOUNTER — Telehealth: Payer: Self-pay | Admitting: *Deleted

## 2015-08-24 ENCOUNTER — Encounter: Payer: Self-pay | Admitting: *Deleted

## 2015-08-24 MED ORDER — NITROGLYCERIN 0.4 MG SL SUBL: 0.4000 mg | SUBLINGUAL_TABLET | SUBLINGUAL | Status: AC | PRN

## 2015-08-24 NOTE — Telephone Encounter (Signed)
Patient request refill on nitroglycerin. Patient informed that she was pass due to f/u in the clinic. First available appointment given and patient aware that Ron Parker has retired and that our office has relocated.

## 2015-09-12 ENCOUNTER — Encounter: Payer: Self-pay | Admitting: Cardiology

## 2015-09-12 ENCOUNTER — Ambulatory Visit (INDEPENDENT_AMBULATORY_CARE_PROVIDER_SITE_OTHER): Payer: Medicare Other | Admitting: Cardiology

## 2015-09-12 VITALS — BP 162/73 | HR 99 | Ht 63.0 in | Wt 254.0 lb

## 2015-09-12 DIAGNOSIS — I255 Ischemic cardiomyopathy: Secondary | ICD-10-CM | POA: Diagnosis not present

## 2015-09-12 DIAGNOSIS — C50919 Malignant neoplasm of unspecified site of unspecified female breast: Secondary | ICD-10-CM

## 2015-09-12 DIAGNOSIS — E782 Mixed hyperlipidemia: Secondary | ICD-10-CM

## 2015-09-12 DIAGNOSIS — C799 Secondary malignant neoplasm of unspecified site: Secondary | ICD-10-CM

## 2015-09-12 DIAGNOSIS — Z136 Encounter for screening for cardiovascular disorders: Secondary | ICD-10-CM | POA: Diagnosis not present

## 2015-09-12 DIAGNOSIS — I739 Peripheral vascular disease, unspecified: Secondary | ICD-10-CM

## 2015-09-12 DIAGNOSIS — I25810 Atherosclerosis of coronary artery bypass graft(s) without angina pectoris: Secondary | ICD-10-CM | POA: Diagnosis not present

## 2015-09-12 DIAGNOSIS — I10 Essential (primary) hypertension: Secondary | ICD-10-CM

## 2015-09-12 DIAGNOSIS — I6523 Occlusion and stenosis of bilateral carotid arteries: Secondary | ICD-10-CM

## 2015-09-12 MED ORDER — CARVEDILOL 6.25 MG PO TABS: 6.2500 mg | ORAL_TABLET | Freq: Two times a day (BID) | ORAL | Status: AC

## 2015-09-12 NOTE — Progress Notes (Signed)
Cardiology Office Note  Date: 09/12/2015   ID: [name], [name] [birth year], MRN [id number]  PCP: Monico Blitz, MD  Primary Cardiologist: Rozann Lesches, MD   Chief Complaint  Patient presents with  . Coronary Artery Disease  . Cardiomyopathy    History of Present Illness: [name] is a medically complex 64 y.o. female former patient of Dr. Ron Parker, not seen since 2012. This is our first meeting in the office. I reviewed extensive records and updated the chart. She is here today with a family member.  History includes complex PAD followed by Dr. Kellie Simmering. She has a history of right femoral popliteal bypass grafting in 2013, left SFA stent in 2011, left popliteal/tibioperoneal trunk atherectomy with PCI 2012, and also carotid artery disease (less than 40% bilateral ICA stenosis as of April 2015).  She had follow-up with Dr. Manuella Ghazi back in September. Records indicate problems with leg edema. She also has metastatic breast cancer and follows with Dr. Jacquiline Doe.  She states that she feels poorly most of the time, has diffuse pain that I suspect is related to her metastatic cancer. She reports chronic back pain, is not able to sit in a chair without shifting or standing up every few minutes. She uses a wheelchair at home. She has also had problems with recurring leg edema, Lasix has been at 40 mg twice daily for one to 2 months. Otherwise she is on a minimal cardiac regimen including Plavix and as needed nitroglycerin.  ECG today shows sinus rhythm at 100 bpm with nonspecific ST changes. She does not report any obvious angina symptoms, however is short of breath chronically. She uses a nebulizer at home.  Recent labwork is noted below.  Past Medical History  Diagnosis Date  . GERD (gastroesophageal reflux disease)   . PAD (peripheral artery disease) (HCC)     Stent, embolectomy, surgery - Dr. Oneida Alar  . Systolic and diastolic CHF, chronic ([place])   . CAD (coronary artery disease)     June  2012, DES to RCA, staged DES to circumflex and DES to diagonal with pressure wire to LAD, FFR 0.84  . DM (diabetes mellitus), type 2 ([place])   . Essential hypertension   . Morbid obesity ([place])   . Hyperlipidemia   . Ischemic cardiomyopathy     LVEF 45-50%, June 2012  . Peripheral neuropathy ([place])   . Syncope     Vasovagal  . DVT (deep venous thrombosis) ([place])   . History of stroke Dec 2012    Right side  . 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 OSA (obstructive sleep apnea)     Dr. Halford Chessman - CPAP started August 2012  . Breast cancer ([place]) 2002    Lumpectomy and radiation on left  . Breast cancer [facility]) 2015    Radiation on right    Past Surgical History  Procedure Laterality Date  . Appendectomy    . Cholecystectomy    . Tubal ligation    . Breast surgery      Left breast cancer  . Popliteal artery angioplasty  11/30/10    Left leg w/ embolectomy, endarterectomy  by Dr.Charles Fields and 3-4 stents  . Pr vein bypass graft,aorto-fem-pop    . Femoral-popliteal bypass graft  05/29/2012    Procedure: BYPASS GRAFT FEMORAL-POPLITEAL ARTERY;  Surgeon: Mal Misty, MD;  Location: [facility] OR;  Service: Vascular;  Laterality: Right;  Right Common femoral artery to above knee popliteal artery bypass graft using non-reversed saphenous vein  .  Intraoperative arteriogram  05/29/2012    Procedure: INTRA OPERATIVE ARTERIOGRAM;  Surgeon: Mal Misty, MD;  Location: [place];  Service: Vascular;  Laterality: Right;  . Breast biopsy Right   . Cataract extraction w/phaco Left 09/08/2014    Procedure: CATARACT EXTRACTION PHACO AND INTRAOCULAR LENS PLACEMENT LEFT EYE CDE=8.54;  Surgeon: Tonny Branch, MD;  Location: [facility] ORS;  Service: Ophthalmology;  Laterality: Left;  . Abdominal aortagram N/A 05/15/2012    Procedure: ABDOMINAL Maxcine Ham;  Surgeon: Elam Dutch, MD;  Location: [facility] CATH LAB;  Service: Cardiovascular;  Laterality: N/A;  . Ct radiation therapy guide Right Aug. 2015    Radiation Tx- Right Breast  . Abdominal  aortagram N/A 11/29/2014    Procedure: ABDOMINAL Maxcine Ham;  Surgeon: Serafina Mitchell, MD;  Location: [facility] CATH LAB;  Service: Cardiovascular;  Laterality: N/A;  . Lower extremity angiogram Bilateral 11/29/2014    Procedure: LOWER EXTREMITY ANGIOGRAM;  Surgeon: Serafina Mitchell, MD;  Location: [facility] CATH LAB;  Service: Cardiovascular;  Laterality: Bilateral;  . Percutaneous stent intervention Left 11/29/2014    Procedure: PERCUTANEOUS STENT INTERVENTION;  Surgeon: Serafina Mitchell, MD;  Location: [facility] CATH LAB;  Service: Cardiovascular;  Laterality: Left;  Superficial Femoral    Current Outpatient Prescriptions  Medication Sig Dispense Refill  . Calcium-Magnesium-Vitamin D (CALCIUM 500 PO) Take 1 tablet by mouth 2 (two) times daily.    . clopidogrel (PLAVIX) 75 MG tablet TAKE 1 TABLET BY MOUTH EVERY DAY 30 tablet 0  . docusate sodium (COLACE) 100 MG capsule Take 200 mg by mouth 3 (three) times daily.     . furosemide (LASIX) 40 MG tablet Take 40 mg by mouth daily.      Marland Kitchen HUMALOG KWIKPEN 100 UNIT/ML injection Inject 3-10 Units into the skin 3 (three) times daily. Per sliding scale.    . insulin glargine (LANTUS) 100 UNIT/ML injection Inject 70 Units into the skin at bedtime.     Marland Kitchen letrozole (FEMARA) 2.5 MG tablet Take 2.5 mg by mouth daily.    Marland Kitchen morphine (MSIR) 30 MG tablet Take 30 mg by mouth every 8 (eight) hours.    . Multiple Vitamin (MULTIVITAMIN) tablet Take 1 tablet by mouth daily.    . nitroGLYCERIN (NITROSTAT) 0.4 MG SL tablet Place 1 tablet (0.4 mg total) under the tongue every 5 (five) minutes x 3 doses as needed for chest pain. 25 tablet 0  . nystatin (MYCOSTATIN) 100000 UNIT/ML suspension Take 5 mLs by mouth as needed.    Marland Kitchen oxyCODONE (OXYCONTIN) 20 mg 12 hr tablet Take 20 mg by mouth every 3 (three) hours.    . palbociclib (IBRANCE) 125 MG capsule Take 125 mg by mouth daily.    . pantoprazole (PROTONIX) 40 MG tablet Take 40 mg by mouth daily.      . prochlorperazine (COMPAZINE) 10 MG tablet  Take 10 mg by mouth as needed for nausea or vomiting.    . calcium carbonate (TUMS - DOSED IN MG ELEMENTAL CALCIUM) 500 MG chewable tablet Chew 1 tablet by mouth 2 (two) times daily as needed for indigestion or heartburn.    . carvedilol (COREG) 6.25 MG tablet Take 1 tablet (6.25 mg total) by mouth 2 (two) times daily. 180 tablet 0   No current facility-administered medications for this visit.    Allergies:  Review of patient's allergies indicates no known allergies.   Social History: The patient  reports that she quit smoking about 4 years ago. Her smoking use included Cigarettes. She has  a 66 pack-year smoking history. She has never used smokeless tobacco. She reports that she does not drink alcohol or use illicit drugs.   Family History: The patient's family history includes Cancer in her father and sister; Diabetes in her brother, daughter, father, and sister; Heart disease in her father; Hyperlipidemia in her brother, sister, and sister; Hypertension in her father and sister; Lung cancer in her father; Stomach cancer in her sister.   ROS:  Please see the history of present illness. Otherwise, complete review of systems is positive for chronic pain, chronic shortness of breath, fatigue.  All other systems are reviewed and negative.   Physical Exam: VS:  BP 162/73 mmHg  Pulse 99  Ht 5\' 3"  (1.6 m)  Wt 254 lb (115.214 kg)  BMI 45.01 kg/m2  SpO2 96%, BMI Body mass index is 45.01 kg/(m^2).  Wt Readings from Last 3 Encounters:  09/12/15 254 lb (115.214 kg)  11/29/14 212 lb (96.163 kg)  11/14/14 212 lb (96.163 kg)     General: Chronically ill-appearing overweight woman, shifts in her chair or stands due to reported back pain.Marland Kitchen HEENT: Conjunctiva and lids normal, oropharynx clear with poor dentition. Neck: Supple, elevated JVP, no carotid bruits, no thyromegaly. Lungs: Decreased breath sounds without wheezing, nonlabored breathing at rest. Cardiac: Regular rate and rhythm, no S3, soft  systolic murmur, no pericardial rub. Abdomen: Soft, nontender, bowel sounds present, no guarding or rebound. Extremities: 2+ leg edema, distal pulses 2+. Skin: Warm and dry. Musculoskeletal: No kyphosis. Neuropsychiatric: Alert and oriented x3, affect grossly appropriate.   ECG: ECG is ordered today.   Recent Labwork: 11/29/2014: BUN 9; Creatinine, Ser 0.70; Hemoglobin 14.6; Potassium 4.0; Sodium 138  October 2016: BUN 13, creatinine 0.7, potassium 4.2, AST 17, ALT 14  Other Studies Reviewed Today:  Echocardiogram 10/21/2011: Study Conclusions  - Left ventricle: The cavity size was normal. Wall thickness was increased increased in a pattern of mild to moderate LVH. Systolic function was normal. The estimated ejection fraction was in the range of 50% to 55%. There is mild hypokinesis of the basalinferolateral myocardium. Features are consistent with a pseudonormal left ventricular filling pattern, with concomitant abnormal relaxation and increased filling pressure (grade 2 diastolic dysfunction). - Aortic valve: Moderately calcified annulus. Trileaflet. - Mitral valve: Calcified annulus. Mild regurgitation. - Left atrium: The atrium was mildly dilated. - Tricuspid valve: Trivial regurgitation. - Pericardium, extracardiac: There was no pericardial effusion.  Assessment and Plan:  1. CAD status post DES to the RCA, circumflex, and diagonal back in 2012 with moderate LAD disease. She has had no cardiology follow-up since 2012. Presents now without obvious angina, however poor overall health status related comorbid conditions. Her cardiac regimen is minimal including Plavix, Lasix, and as needed nitroglycerin. Plan is to place her on Coreg 6.25 mg twice daily for now, obtain a follow-up echocardiogram to reassess cardiac structure and function.  2. History of ischemic cardiomyopathy with LVEF 45-50% in 2012. As noted, follow-up echocardiogram will be obtained to  help guide further medical therapy.  3. Reported history of bilateral breast cancer with metastasis and chronic pain. She follows with Dr. Jacquiline Doe.  4. Hyperlipidemia, previously on statin therapy. This has dropped off of her medication list at some point over the years.  5. Essential hypertension, will plan to titrate medical therapy as tolerated.  6. Severe peripheral artery disease, followed by Dr. Kellie Simmering.  7. Carotid artery disease, less than 40% bilateral ICA stenoses as of April 2015.  Current medicines were reviewed  with the patient today.   Orders Placed This Encounter  Procedures  . EKG 12-Lead  . ECHOCARDIOGRAM COMPLETE    Disposition: FU with me in 3 weeks.   Signed, Satira Sark, MD, [facility] 09/12/2015 2:25 PM    [facility] [place], [place], [place] [address] Phone: [phone]; Fax: [phone]

## 2015-09-12 NOTE — Patient Instructions (Signed)
Your physician has recommended you make the following change in your medication:  Start carvedilol 6.25 mg twice daily. Continue all other medications the same. Your physician has requested that you have an echocardiogram. Echocardiography is a painless test that uses sound waves to create images of your heart. It provides your doctor with information about the size and shape of your heart and how well your heart's chambers and valves are working. This procedure takes approximately one hour. There are no restrictions for this procedure. Your physician recommends that you schedule a follow-up appointment in: 3-4 weeks.

## 2015-09-28 ENCOUNTER — Other Ambulatory Visit: Payer: Medicare Other

## 2015-10-03 ENCOUNTER — Ambulatory Visit: Payer: Medicare Other | Admitting: Cardiology

## 2015-10-25 ENCOUNTER — Other Ambulatory Visit: Payer: Medicare Other

## 2015-11-08 ENCOUNTER — Ambulatory Visit: Payer: Medicare Other | Admitting: Cardiology

## 2015-11-08 ENCOUNTER — Other Ambulatory Visit: Payer: Medicare Other

## 2015-11-08 ENCOUNTER — Ambulatory Visit: Payer: Medicare Other

## 2015-11-12 DEATH — deceased

## 2016-02-13 ENCOUNTER — Encounter: Payer: Self-pay | Admitting: Family

## 2016-02-21 ENCOUNTER — Encounter (HOSPITAL_COMMUNITY): Payer: Self-pay

## 2016-02-21 ENCOUNTER — Ambulatory Visit: Payer: Self-pay | Admitting: Family
